# Patient Record
Sex: Female | Born: 2000 | Race: White | Hispanic: No | Marital: Single | Smoking: Never smoker
Health system: Southern US, Community
[De-identification: ages and names within clinical notes are randomized; demographics above are authoritative.]

## PROBLEM LIST (undated history)

## (undated) DIAGNOSIS — G7000 Myasthenia gravis without (acute) exacerbation: Secondary | ICD-10-CM

## (undated) DIAGNOSIS — Z91018 Allergy to other foods: Secondary | ICD-10-CM

## (undated) DIAGNOSIS — D509 Iron deficiency anemia, unspecified: Secondary | ICD-10-CM

## (undated) DIAGNOSIS — Z8669 Personal history of other diseases of the nervous system and sense organs: Secondary | ICD-10-CM

## (undated) DIAGNOSIS — R519 Headache, unspecified: Secondary | ICD-10-CM

## (undated) DIAGNOSIS — Z931 Gastrostomy status: Secondary | ICD-10-CM

## (undated) DIAGNOSIS — R011 Cardiac murmur, unspecified: Secondary | ICD-10-CM

## (undated) DIAGNOSIS — M791 Myalgia, unspecified site: Secondary | ICD-10-CM

## (undated) DIAGNOSIS — K589 Irritable bowel syndrome without diarrhea: Secondary | ICD-10-CM

## (undated) DIAGNOSIS — E039 Hypothyroidism, unspecified: Secondary | ICD-10-CM

## (undated) DIAGNOSIS — M2550 Pain in unspecified joint: Secondary | ICD-10-CM

## (undated) DIAGNOSIS — K219 Gastro-esophageal reflux disease without esophagitis: Secondary | ICD-10-CM

## (undated) DIAGNOSIS — G909 Disorder of the autonomic nervous system, unspecified: Secondary | ICD-10-CM

## (undated) DIAGNOSIS — G950 Syringomyelia and syringobulbia: Secondary | ICD-10-CM

## (undated) DIAGNOSIS — Z9889 Other specified postprocedural states: Secondary | ICD-10-CM

## (undated) DIAGNOSIS — E221 Hyperprolactinemia: Secondary | ICD-10-CM

## (undated) DIAGNOSIS — R0600 Dyspnea, unspecified: Secondary | ICD-10-CM

## (undated) DIAGNOSIS — L309 Dermatitis, unspecified: Secondary | ICD-10-CM

## (undated) DIAGNOSIS — K8020 Calculus of gallbladder without cholecystitis without obstruction: Secondary | ICD-10-CM

## (undated) DIAGNOSIS — J189 Pneumonia, unspecified organism: Secondary | ICD-10-CM

## (undated) DIAGNOSIS — Q796 Ehlers-Danlos syndrome, unspecified: Secondary | ICD-10-CM

## (undated) DIAGNOSIS — T8859XA Other complications of anesthesia, initial encounter: Secondary | ICD-10-CM

## (undated) DIAGNOSIS — I7300 Raynaud's syndrome without gangrene: Secondary | ICD-10-CM

## (undated) DIAGNOSIS — T4145XA Adverse effect of unspecified anesthetic, initial encounter: Secondary | ICD-10-CM

## (undated) DIAGNOSIS — H903 Sensorineural hearing loss, bilateral: Secondary | ICD-10-CM

## (undated) DIAGNOSIS — G935 Compression of brain: Secondary | ICD-10-CM

## (undated) DIAGNOSIS — Z95828 Presence of other vascular implants and grafts: Secondary | ICD-10-CM

## (undated) DIAGNOSIS — R112 Nausea with vomiting, unspecified: Secondary | ICD-10-CM

## (undated) DIAGNOSIS — E162 Hypoglycemia, unspecified: Secondary | ICD-10-CM

## (undated) DIAGNOSIS — E8840 Mitochondrial metabolism disorder, unspecified: Secondary | ICD-10-CM

## (undated) DIAGNOSIS — Q6689 Other  specified congenital deformities of feet: Secondary | ICD-10-CM

## (undated) DIAGNOSIS — D649 Anemia, unspecified: Secondary | ICD-10-CM

## (undated) HISTORY — DX: Dermatitis, unspecified: L30.9

## (undated) HISTORY — PX: GASTROSTOMY TUBE PLACEMENT: SHX655

## (undated) HISTORY — DX: Anemia, unspecified: D64.9

## (undated) HISTORY — DX: Hypothyroidism, unspecified: E03.9

## (undated) HISTORY — DX: Mitochondrial metabolism disorder, unspecified: E88.40

## (undated) HISTORY — PX: GALLBLADDER SURGERY: SHX652

## (undated) HISTORY — DX: Calculus of gallbladder without cholecystitis without obstruction: K80.20

## (undated) HISTORY — DX: Compression of brain: G93.5

## (undated) HISTORY — PX: OTHER SURGICAL HISTORY: SHX169

## (undated) HISTORY — PX: MUSCLE BIOPSY: SHX716

## (undated) HISTORY — DX: Hypoglycemia, unspecified: E16.2

## (undated) HISTORY — PX: TONSILLECTOMY: SUR1361

## (undated) HISTORY — PX: ADENOIDECTOMY: SUR15

---
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
## 2019-01-03 ENCOUNTER — Encounter (INDEPENDENT_AMBULATORY_CARE_PROVIDER_SITE_OTHER): Payer: Self-pay | Admitting: *Deleted

## 2019-01-28 ENCOUNTER — Telehealth (INDEPENDENT_AMBULATORY_CARE_PROVIDER_SITE_OTHER): Payer: Self-pay | Admitting: Family

## 2019-01-28 NOTE — Telephone Encounter (Signed)
  Who's calling (name and relationship to patient) : [facility]   Best contact number: [phone]  Provider they see: [facility]  Reason for call:  Call stated they needed clinical records for patient (fax [phone])    PRESCRIPTION REFILL ONLY  Name of prescription:  Pharmacy:

## 2019-01-28 NOTE — Telephone Encounter (Signed)
Last office visit faxed as requested to number provided.

## 2019-02-03 ENCOUNTER — Other Ambulatory Visit (INDEPENDENT_AMBULATORY_CARE_PROVIDER_SITE_OTHER): Payer: Self-pay | Admitting: Pediatrics

## 2019-02-08 ENCOUNTER — Encounter (INDEPENDENT_AMBULATORY_CARE_PROVIDER_SITE_OTHER): Payer: Self-pay

## 2019-02-10 ENCOUNTER — Encounter (INDEPENDENT_AMBULATORY_CARE_PROVIDER_SITE_OTHER): Payer: Self-pay | Admitting: "Endocrinology

## 2019-02-10 ENCOUNTER — Other Ambulatory Visit: Payer: Self-pay

## 2019-02-10 ENCOUNTER — Ambulatory Visit (INDEPENDENT_AMBULATORY_CARE_PROVIDER_SITE_OTHER): Payer: Commercial Managed Care - PPO | Admitting: "Endocrinology

## 2019-02-10 VITALS — BP 108/66 | HR 64 | Ht 64.17 in | Wt 121.6 lb

## 2019-02-10 DIAGNOSIS — R42 Dizziness and giddiness: Secondary | ICD-10-CM

## 2019-02-10 DIAGNOSIS — E860 Dehydration: Secondary | ICD-10-CM

## 2019-02-10 DIAGNOSIS — I951 Orthostatic hypotension: Secondary | ICD-10-CM

## 2019-02-10 DIAGNOSIS — E063 Autoimmune thyroiditis: Secondary | ICD-10-CM | POA: Diagnosis not present

## 2019-02-10 DIAGNOSIS — E039 Hypothyroidism, unspecified: Secondary | ICD-10-CM

## 2019-02-10 DIAGNOSIS — E221 Hyperprolactinemia: Secondary | ICD-10-CM

## 2019-02-10 DIAGNOSIS — E049 Nontoxic goiter, unspecified: Secondary | ICD-10-CM | POA: Diagnosis not present

## 2019-02-10 DIAGNOSIS — E162 Hypoglycemia, unspecified: Secondary | ICD-10-CM

## 2019-02-10 DIAGNOSIS — E2740 Unspecified adrenocortical insufficiency: Secondary | ICD-10-CM

## 2019-02-10 DIAGNOSIS — R5383 Other fatigue: Secondary | ICD-10-CM

## 2019-02-10 DIAGNOSIS — M6281 Muscle weakness (generalized): Secondary | ICD-10-CM

## 2019-02-10 NOTE — Progress Notes (Signed)
Subjective:  Subjective  Patient Name: Laura Parrish Date of Birth: 01-30-2000  MRN: [id number]  Laura Parrish  presents for her televisit today for follow up evaluation and management of her acquired hypothyroidism due to Hashimoto's thyroiditis and many other health problems.   HISTORY OF PRESENT ILLNESS:   Laura Parrish is a 19 y.o. Caucasian young lady.   Carlene was accompanied by her mother  1. Laura Parrish's initial pediatric endocrine clinic visit here at [facility] occurred on 01/29/18. The following information is a composite of the history gained at her initial visit, information gained after 7 hours of reviewing her records in [place], and information gleaned on 02/05/18:   A. Aniyia has had a very complicated medical and surgical history. She has been hospitalized many times during her life. Her problem list includes the following:    1). Chronic GI problems, with frequent constipation, frequent diarrhea, BMs occurring immediately after eating, nausea, abdominal pains, and feeding difficulties requiring the use of both oral and nocturnal G-tube feedings: Previous EGD and colonoscopy have been unremarkable. TTG IgA was negative on 12/29/17.    2). Chronic allergies, including seasonal allergies, allergies to other environmental agents, latex, Propofol, fish and shellfish according to rashes but not to testing, and Alpha-Gal allergy with a positive result, but not specifically beef, pork, or lamb;   3). Chronic fatigue   4). Iron deficiency anemia   5). Hypothyroidism with elevated TPO and thyroglobulin antibodies, c/w Hashimoto's thyroiditis;       6). Muscle weakness and myalgias   7). 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 Family was offered treatment with Florinef, but declined because their pharmacist advised against starting Florinef due to possible adverse effects.     8). Intermittent dehydration requiring administration  of iv fluids via her port-a-cath every 6 weeks, but more recently every 2 weeks   9). Chronic headaches, to include migraines             10). 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 Chronic swelling of her hands and feet   13). Chronic pain of her back, joints, legs, and feet   14). Skin rashes, hives, eczema, and flushing   15). Irregular menses and menorrhagia   16). Nocturnal hypoglycemia if she does not have nocturnal pump feedings.    17). Chronic illness with low grade fevers, sometimes up to 101, but mostly 99-100 degrees   18). 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 However, her testing did not support that hypothesis. Mom has the same genetic variant, but not all of the same problems.   B. Perinatal history: Gestational Age: [redacted]w[redacted]d; 5 lb 7 oz (2.466 kg); Healthy newborn, except noted to have a right club foot deformity.    C. Infancy: She had to be hospitalized several times for unexplained dehydration. She had club foot surgery at about 48 weeks of age.   D. Childhood:   1). She was sick a lot.    2). She had PT for her club foot.    3). A Chiari malformation was discovered at age 22. She had decompression surgery in 2007.    4).  In 2008 she was discovered at [facility] to have a tethered cord. She had a release procedure in 2008.   5). In 2010 she had a muscle BX that showed mild variation in fiber size, more notable in Type I fibers with rare atrophic Type I fibers. EM showed that the normal sarcomeric architecture was preserved. Mitochondria were morphologically unremarkable. Dr. Oliver Barre reportedly told the family that [place]man had a mitochondrial  cytopathy and mitochondrial myopathy.    6). In 2010 she continued to have severe problems with lack of appetite, nausea, and feeding difficulties. In late 2010 she had a G-tube placed.    7). Genetic testing was performed at [facility] on 09/02/08. Karyotype and chromosomal array were normal. 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 Mitochondrial DNA copy number in muscle tissue was normal, 123% of the mean value of the age and tissue tested controls.   8). On 10/18/12 Deshia's TFTs were c/w primary hypothyroidism. Levothyroxine treatment was initiated.    9). She had a cholecystectomy in June 2015 for biliary hypokinesia.    10). A port-a-cath was placed in August 2018.    11). In December 2018 she saw a geneticist, Dr. Delilah Shan, in [place] for evaluation of a mitochondrial dysfunction. 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 Targeted GeneDX testing was performed in June-July 2019 and reportedly showed allergies in both [place] and her brother. Testing in Teah's brother also showed genetic tendencies for seizures and developmental delays.     E. Chief complaint:   1). Hypothyroidism was diagnosed on 18 October 2012 after several years of variable thyroid hormone test results. Her TSH was high 10.35 (ref 0.5-4.5) and free T4 was low at 0.67 (ref 0.80-2.0). TPO antibody and thyroglobulin antibody were both reportedly positive. She started thyroid hormone at about that time. She takes generic levothyroxine, 50 mcg/day. That dose has not been changed since it was started. She does not think that she has ever had thyroid swelling or pain. Mom said that Chrishana's TSH and free T4 values have been normal. However, her TSH was elevated again in February 2019.   2). Hypoglycemia: At about age 26 she had low BGs almost all the time. If she did not get G-tube feedings at night, the BGs in the mornings could be in  the 40s-50s. She had never been evaluated for adrenal insufficiency. If her BGs are low during the day she can get dizzy.   3). Dizziness: She has been dizzy for many years. The dizziness was originally ascribed to her Arnold-Chiari malformation, but did not improve after surgery. About 30% of her dizziness was associated with low BGs. Some of her dizziness was a spinning dizziness.  Some of her dizziness appears to be orthostatic dizziness. This latter dizziness occurs almost every morning when she sits up from a supine or prone position. The dizziness can also occur if she stands up too fast from a seated position. Whenever she gets dizzy for any reason, she often gets black spots in front of her eyes and feels like she will pass out.   4). Abnormal periods and excessive menstrual and intermenstrual bleeding: Menarche occurred at age 39. Periods have never been regular. When she took most OCPs she had persistent vaginal bleeding. Seasonale caused her to have menses every 3 months for about one year, then it became less effective. She continued to have heavy spotting. This problem has been sometimes attributed to a connective tissue  disorder.    5). 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 Her initial [facility] consultation occurred on 03/15/12.      1). The report of her 2009 muscle BX was reviewed. The genetics reports from [facility] done in July 2010 and from [facility] in 2010 were reviewed. Also reviewed was a report from Dr. Salomon Fick of the [facility] who did not endorse support for a diagnosis of mitochondrial disease.      2). The [facility] geneticist noted that none of Denetria's previous testing supported a diagnosis of a mitochondrial disorder. The geneticist also stated that no specific genetic disorders are known to cause her constellation of symptoms shared by her and her younger brother, including Chiari I malformation. Genome sequencing testing, to determine  if Julianah and her brother had a unique gene mutation underlying their presentations, was offered to the family. Unfortunately the costs of that testing were too high for the family to agree to the testing     B). Her [facility] record note from 10/15/15 shows that Laura Parrish's previous tests for myopathy and mitochondrial function were all negative. Genetic whole exome testing was negative for a 19 y.o. female with Chiari I malformation, fatigue, muscle weakness, feeding difficulties, tethered cord, and clinical diagnosis of Ehlers-Danlos syndrome. GeneDX testing showed a variant in the mitochondrial genome of uncertain significance that was found in 200% of Arah's mitochondria and 100% of her mother's mitochondria. Brother presumably had the same variant. It was noted that since [place] and Braxton inherited the same change as the mother and at the same levels, it was unlikely that this variant was causing Lou's and Braxton's symptoms, because the mother did not exhibit similar symptoms. [Addendum 02/05/18: Mom says that she did have some of the same symptoms that her children had, but the geneticist might not have realized that mom did have some of the same symptoms. Mom did not have as many symptoms as Laura Parrish and Braxton had.] Multiple rounds of genetic testing including WES and mitochondrial genome sequencing, were not suggestive of mitochondrial disorder or any other genetic diseases. The family was offered the option of applying for further testing in the [facility]. The family applied for that program, but were not accepted due to the fact that the program wanted patients with different issues.     C). When Laura Parrish saw Dr Penni Bombard, a geneticist in [place], he confirmed that she does have a mitochondrial disorder and also has Ehlers-Danlos syndrome.     6). Feeling unwell, fatigue, fevers, headaches, nasal and head congestion, lack of appetite: These problems vary from one part of the day to  another. She has at least one of them every day.     A). Fatigue: She does not feel tired when she awakens, but does begin to feel tired about 2 PM. If she naps for an hour or two, the fatigue improves for several hours, but then recurs around 6 PM. If she does not nap, she remains tired and develops a bitemporal headache about 4-5 PM. She occasionally has a problem with insomnia, but not with early awakening. Sleeping in on weekends does not improve her fatigue.      B). Fevers: When she feels bad she usually has fevers. Most of her fevers are in the 98.8-99.2 range. These "fevers" occur about 5-7 PM.     C). Nasal and head congestion: She has had a stuffed up nose for 4-5 months. When she  has nasal congestion she also sometimes has head congestion. However, she does not have head congestion without having nasal congestion.      D). Headaches: She often has nuchal headaches when her trapezius muscles are sore/stiff. Sometimes these nuchal headache extend to the vertex. She also has bitemporal headaches when she gets tired.   F. Pertinent family history:   1). Stature and puberty: Mom is 5-1. Dad is 5-5. Mom had menarche at age 20. Mom has had irregular menses prior to her first pregnancy. She was diagnosed with endometriosis after her first pregnancy. After treatment for endometriosis, presumably GnRH analogues for 6 months, her menses were normal.     2). Obesity: Maternal grandmother   3). DM: Maternal grandmother has T2DM. A maternal grand uncle had DM, possibly type 1.    4). Thyroid: 20 y.o. sister has had a few abnormal thyroid hormone levels. Paternal grandmother is hypothyroid, without having had thyroid surgery, irradiation. or been on a prolonged low iodine diet.    5). ASCVD: Mother has a heart murmur.    6). Cancers: paternal grandfather dies of lung CA. There was also leukemia in a distant paternal relative.    7). Others: Older sister has had oligomenorrhea frequently. Mother has  migraines. Younger brother has worse mitochondrial dysfunction and seizures. Maternal grandmother has osteoarthritis and fibromyalgia.  Maternal grandmother takes B12 shots for anemia. Maternal grandmother also has hypertension.  One maternal uncle had cardiomyopathy. Maternal grand uncle had his first CVA at age 20. A distant paternal second cousin and a paternal first cousin had lupus.]    G. Lifestyle:   1). Jasani's diet: She can eat food normally and typically eats 3 small meals per day. She estimates that about 40% of her calories comes from oral intake. She does not eat much at any one time. She is not a "foodie" by nature and is a very picky eater.  She has G-tube feedings every night: 2 cans of Vital Peptide, 1.5 calories per mL, continuous feeding, from 9:30 PM to 7:30 AM via a pump. She does not usually take fluids by the G-tube during the day, but may do so during the Summer months to prevent dehydration.    2). She receives iv fluid infusions by her port-a-cath every other week: D5 1/2 NS, three 1000 ml bags over 12 hours. If she gets sick or gets dehydrated, she gets 3000 ml of D10NS iv over 12 hour. She receives substantial home health nursing support from [facility].     3). Physical activities: She does not walk much for exercise because walking causes her feet to swell. She does not go outside much during the Winter months. She was told by one doctor that she had angioedema.    4). Medications:    A). Morning medications: CoQ10, vitamin E, vitamin C. Prevacid, and allergy medications. Breakfast is usually a fruit smoothy.     B). Bedtime: LT4, Topamax, Lyrica, Seasonale,  2. Clinical course:  A. In May 2020 she had a central venous catheter access device inserted. The catheter is working well.   3. Laura [facility]rish[facility] visit occurred on 11/18/18. I continued her on Synthroid 88 mcg/day.   A. In the interim she has been fairly healthy, but has had  some intermittent URI symptoms. She still has had temperatures in the 99.4-100.0 in the evenings, but normalizes the next day.    B. Her breathing difficulties have worsened. Dr. Damita LackStoudemire feels that she  has muscle weakness. [place] saw Dr. Damita Lack on 11/19/18. Her PFTs were essentially normal. The MIP/MEPs were better, but still low.  Her sleep study was normal. He put her on Mestinon three times daily. The Mestinon helped for several hours.   C. She also saw Dr. Artis Flock on 11/19/18. Dr. Artis Flock ordered an EMG to be done at [facility]  .   D. Laura Parrish is still receiving feedings orally and also via her G-tube. She still takes 88 mcg/day of Synthroid.   E. She is having alternating sensations of being hot and cold.   F. She is still pretty tired.   G. She now has weekly 12-hour iv infusions of D5. She is having fewer headaches. She feels better for about a week after the infusions.   4. Pertinent Review of Systems:  Constitutional: Laura Parrish feels "tired". She is colder than others most of the time. Conversely when she is out in the heat during the Summer, she never sweats.  Headaches are somewhat worse. She also has frequent bilateral ear aches and popping of both ears. 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 The possibility of otosclerosis has been brought up for Laura Parrish since mom has that problem.   Eyes: Vision is "not great, but not horrible, but slightly worse". Her glasses help a little bit. She has not had an exam in the last year. There are no other significant eye complaints. Neck: The patient has had occasional complaints of anterior neck soreness and difficulty swallowing.  Heart: Her heart rate is usually in the 60-70s, but when she is tired the heart rate can be in the 50s-60s. She occasionally has chest pain in the shoulders and in the middle of her chest. The chest will often be tender to palpation at those times. The patient has no complaints of palpitations,  irregular heat beats, chest pain, or chest pressure. Gastrointestinal: She has frequent nausea in the middle of the night. The nausea can last for up to three hours if she does not take Zofran. She sometimes also has nausea during the day, but she has not been able to identify a particular trigger. She also occasionally has some mild reflux. She infrequently has hypogastric abdominal pains, usually associated with being constipated or having a menstrual period. Bowel movents alternate from diarrhea to constipation. The patient has no complaints of excessive hunger. Hands: Always wet and cold.  Legs: Muscle mass and strength "are not great". She occasionally has pains of her anterior thighs and anterior shins. There are no other complaints of numbness, tingling, burning, or pain. She notes edema about 3-4 days per week.  Feet: Feet are always wet and cold. There are no obvious foot problems. She occasionally has numbing of the medial 3-4 toes. There are no other complaints of numbness, tingling, burning, or pain. She often has dorsal edema.  GYN: LMP symptoms occurred in October 2020, but see did not have bleeding. Her Seasonale contraceptive cause her to have menses about every three months.   PAST MEDICAL, FAMILY, AND SOCIAL HISTORY  Past Medical History:  Diagnosis Date  . Chiari I malformation (HCC)   . Eczema   . Hypoglycemia   . Hypothyroid   . Mitochondrial disease (HCC)     Family History  Problem Relation Age of Onset  . Asthma Mother   . Allergic rhinitis Mother   . Heart murmur Mother   . Allergic rhinitis Sister   . Allergic rhinitis Brother   .  Asthma Brother   . Seizures Brother   . Mitochondrial disorder Brother   . Allergic rhinitis Maternal Grandmother   . Asthma Maternal Grandmother   . Diabetes type II Maternal Grandmother   . Lung cancer Maternal Grandfather   . Angioedema Neg Hx   . Eczema Neg Hx   . Immunodeficiency Neg Hx   . Urticaria Neg Hx      Current  Outpatient Medications:  .  albuterol (PROVENTIL HFA) 108 (90 Base) MCG/ACT inhaler, Inhale 2 puffs into the lungs every 4 (four) hours as needed for wheezing or shortness of breath., Disp: 6.7 g, Rfl: 2 .  ascorbic acid (VITAMIN C) 250 MG tablet, , Disp: , Rfl:  .  azelastine (ASTELIN) 0.1 % nasal spray, Place 2 sprays into both nostrils 2 (two) times daily. Use in each nostril as directed, Disp: 30 mL, Rfl: 5 .  clobetasol (OLUX) 0.05 % topical foam, Apply topically 2 (two) times daily. Apply to affected areas twice daily as needed taking care to avoid axillae and groin area., Disp: 45 g, Rfl: 3 .  Coenzyme Q-10 200 MG CAPS, Take 1 capsule by mouth daily. , Disp: , Rfl:  .  cromolyn (GASTROCROM) 100 MG/5ML solution, Take 5 mLs (100 mg total) by mouth 4 (four) times daily -  before meals and at bedtime., Disp: 600 mL, Rfl: 12 .  EPINEPHrine 0.3 mg/0.3 mL IJ SOAJ injection, Inject into the muscle once., Disp: , Rfl:  .  lansoprazole (PREVACID) 30 MG capsule, TAKE 1 CAPSULE BY MOUTH DAILY, Disp: 90 capsule, Rfl: 0 .  leucovorin (WELLCOVORIN) 25 MG tablet, Take 1 tablet by mouth 2 (two) times daily., Disp: , Rfl:  .  levOCARNitine (CARNITOR) 1 GM/10ML solution, TAKE 2 TEASPOONFUL (10ML) BY MOUTH TWICEDAILY, Disp: 118 mL, Rfl: 12 .  levocetirizine (XYZAL) 5 MG tablet, TAKE 1 TABLET BY MOUTH EVERY EVENING, Disp: 30 tablet, Rfl: 1 .  levonorgestrel-ethinyl estradiol (SEASONALE) 0.15-0.03 MG tablet, TAKE 1 TABLET BY MOUTH EVERY DAY, Disp: 1 Package, Rfl: 2 .  lidocaine (XYLOCAINE) 5 % ointment, Apply 1 application topically as needed., Disp: 35.44 g, Rfl: 3 .  metaxalone (SKELAXIN) 400 MG tablet, Take 1 tablet (400 mg total) by mouth daily., Disp: 30 tablet, Rfl: 6 .  Nutritional Supplements (ENSURE CLEAR) LIQD, 474 mLs by Gastrostomy Tube route daily., Disp: 60 Bottle, Rfl: 5 .  Nutritional Supplements (PEDIASURE PEPTIDE 1.0 CAL PO), Take 1 tablet by mouth daily. , Disp: , Rfl:  .  ondansetron  (ZOFRAN-ODT) 4 MG disintegrating tablet, Take 1 tablet under the tongue every 6-8 hours as needed for nausea., Disp: 30 tablet, Rfl: 5 .  polyethylene glycol powder (GLYCOLAX/MIRALAX) powder, 0.5 Containers as needed. , Disp: , Rfl:  .  pregabalin (LYRICA) 75 MG capsule, TAKE 1 CAPSULE BY MOUTH IN THE MORNING AND 2 CAPSULES IN THE EVENING FOR LEG PAIN AND HEADACHE, Disp: 90 capsule, Rfl: 5 .  promethazine (PHENERGAN) 25 MG tablet, Take 1 tablet at onset of migraine. May repeat every 6 hours PRN, Disp: 30 tablet, Rfl: 0 .  pyridostigmine (MESTINON) 60 MG tablet, Take 1 tablet (60 mg total) by mouth 3 (three) times daily., Disp: 30 tablet, Rfl: 3 .  rizatriptan (MAXALT) 10 MG tablet, TAKE 1 TABLET BY MOUTH AS NEEDED AS DIRECTED, Disp: 12 tablet, Rfl: 3 .  SYNTHROID 88 MCG tablet, TAKE 1 TABLET BY MOUTH EVERY MORNING ON AN EMPTY STOMACH, Disp: 90 tablet, Rfl: 1 .  TROKENDI XR 50 MG CP24,  TAKE 1 CAPSULE BY MOUTH DAILY, Disp: 30 capsule, Rfl: 3  Allergies as of 02/10/2019 - Review Complete 02/10/2019  Allergen Reaction Noted  . Other Anaphylaxis 03/29/2013  . Latex Rash 04/08/2012  . Lactated ringers  05/28/2018  . Propofol Other (See Comments) 05/17/2012  . Tape Itching and Other (See Comments) 05/17/2012     reports that she has never smoked. She has never used smokeless tobacco. She reports that she does not drink alcohol or use drugs. Pediatric History  Patient Parents  . Yohannes,Leesa (Mother)  . Donovan Kail (Father)   Other Topics Concern  . Not on file  Social History Narrative   Deniesha is at [employer] for associate in arts to transfer to [employer].    She enjoys reading, playing with her cat and her dog, and hanging out with her sister.     1. School and Family: She attends [employer], with mostly on-line classes 2. Activities: Sedentary 3. Primary Care Provider: Janne Napoleon, MD  4. Peds Neurology: Dr. Carylon Perches, MD 5.  Allergy: Dr. Salvatore Marvel, All[facility] and [place], [place] 6. GI: No one at present 7. [facility]eds [facility]ology: [facility] 8. Peds Pulmonary: Dr. Glencoe Cellar 9. Genetics: [facility] in the past  REVIEW OF SYSTEMS: There are no other significant problems involving Analycia's other body systems.    Objective:  Objective  Vital Signs:  BP 108/66   Pulse 64   Ht 5' 4.17" (1.63 m)   Wt 121 lb 9.6 oz (55.2 kg)   BMI 20.76 kg/m    Ht Readings from Last 3 Encounters:  02/10/19 5' 4.17" (1.63 m) (48 %, Z= -0.05)*  10/15/18 5' 4.25" (1.632 m) (49 %, Z= -0.01)*  08/25/18 5' 4.17" (1.63 m) (48 %, Z= -0.04)*   * Growth percentiles are based on CDC (Girls, 2-20 Years) data.   Wt Readings from Last 3 Encounters:  02/10/19 121 lb 9.6 oz (55.2 kg) (37 %, Z= -0.32)*  11/19/18 120 lb 9.5 oz (54.7 kg) (36 %, Z= -0.35)*  11/19/18 120 lb 9.5 oz (54.7 kg) (36 %, Z= -0.35)*   * Growth percentiles are based on CDC (Girls, 2-20 Years) data.   HC Readings from Last 3 Encounters:  No data found for [facility]   Body surface area is 1.58 meters squared. 48 %ile (Z= -0.05) based on CDC (Girls, 2-20 Years) Stature-for-age data based on Stature recorded on 02/10/2019. 37 %ile (Z= -0.32) based on CDC (Girls, 2-20 Years) weight-for-age data using vitals from 02/10/2019.     PHYSICAL EXAM:  Constitutional: Emerly looks tired. She is awake and alert. Her affect is rather flat. Her insight is good.  Eyes: There is no arcus or proptosis.  Mouth: The oropharynx appears normal. The tongue appears normal. There is normal oral moisture. There is no obvious gingivitis. Thre is no oral hyperpigmentation.  Neck: There are no bruits present. The thyroid gland appears mildly enlarged.  The thyroid gland is approximately 21-22. grams in size. Both lobes are enlarged, with the left lobe being larger. The consistency of the thyroid gland is somewhat full. She has bilateral thyroid tenderness to palpation, more intense on the  right.  Lungs: The lungs are clear. Air movement is good. Heart: The heart rhythm and rate appear normal. Heart sounds S1 and S2 are normal. I do not appreciate any pathologic heart murmurs. Abdomen: The abdominal size is normal. Bowel sounds are normal. The abdomen is soft and non-tender. There is  no obviously palpable hepatomegaly, splenomegaly, or other masses.  Arms: Muscle mass appears appropriate for age.  Hands: There is no obvious tremor. Phalangeal and metacarpophalangeal joints appear normal. Palms are normal. Legs: Muscle mass appears appropriate for age. There is no edema.  Neurologic: Muscle strength is 4/5 in the UEs and 4-5/5 in th LEs. Muscle tone appears normal. Sensation to touch is normal in the legs.    LAB DATA:   Labs 11/12/18: TSH 1.12, free T4 1.38, T3 156 (ref 71-180); prolactin 11.3 (ref 4.8-23.3)  Labs 09/16/18: Androstenedione 55 (ref 51-230), DHEAS 67 (ref 51-321)  Labs 08/30/18 at 8:43 AM: ACTH 13 (ref 6-50), cortisol 26.2 (ref 4-22), aldosterone 4 (ref < 28), plasma renin activity 2.4 (ref 0.25-5.82),   Labs 08/02/18: TSH 2.12, free T4 1.3, free T3 3.0; CMP normal; aldosterone <1 (reg 3-28), plasma renin activity (PRA) 0.92 (ref 0.25-5.82); prolactin 9.9 (ref 3-30)  Labs 02/01/18 at 8 AM: TSH 4.16 (ref 0.50-4.30, but many endocrinologists consider 3.4 to be the true physiologic upper limit of normal), free T4 1.4 (ref 0.8-1.4), free T3 2.7 (ref 3.0-4.7), TPO antibody 241 (ref <9), thyroglobulin antibody 1 (ref < or = 1); CMP normal except CO2 19; cholesterol 123, triglycerides 114, HDL 37, LDL 66; Prolactin 16.1 (ref 3.3-20); IGFBP-3 7.8 (ref 3.2-7.9); IGF-1 249 (ref 108-548, but very appropriate for a young woman whose height has plateaued); iron 177 (ref 27-164); CBC normal; androstenedione 46 (ref 51-230), DHEAS 74 (ref 51-321); ACTH 14, cortisol 28.9  Labs 11/25/17: TSH 1.58; CBC normal except 736 eosinophils (ref 15-500)  Labs 06/24/17: TSH 2.418, free T4  0.98  Labs 04/21/17: TSH 1.30  Labs 03/10/17: TSH 7.87, free T4 1.2; CBC normal; prolactin 7.1  Labs 06/24/16: TSH 1.960, free T4 1.12  Labs 04/10/16: TSH 2.28, TPO antibody 165 (ref <9), thyroglobulin antibody 2 (ref <2)  Labs 05/29/15: TSH 1.640, free T4 1.09  Labs 11/28/14: TSH 1.87, free T4 0.80  Labs 05/31/2014: TSH 1.680, free T4 1.23  Labs 04/19/14: TSH 1.19  Labs 09/30/13: TSH 2.53, free T4 1.14  Labs 02/04/13: TSH 0.88, T4 11.3  Labs 10/18/12: TSH 10.35 (ref 0.5-4.5), free T4 0.67 (ref 0.8-2.0)    Assessment and Plan:  Assessment  ASSESSMENT:  1-3. Hypothyroid, acquired/goiter/thyroiditis:   A. The occurrence of autoimmune thyroid disease with positive anti-thyroid antibodies is very common. What is uncommon is that her dose of levothyroxine had not changed in about 6 years.   B. At her visit in January 2020 her TSH was >4.0 and her TPO antibody level was higher. We increased her Synthroid dose to 88 mcg/day.   C. From January to August 2020 her goiter remained the same in overall size, but the lobes had shifted in size. The process of waxing and waning of thyroid gland size was c/w evolving Hashimoto's thyroiditis.   D. At her August visit her TFTs were at about the 30% of the physiologic range. I would usually have  increase the Synthroid dose to 100 mcg/day, but decided to await the results of her Hypothalamic-Pituitary-Adrenal (HPA) axis testing. Fortunately, those results were normal.   E. Since her August 2020 visit she has had intermittent flare ups of thyroiditis.   E. Her TFTs drawn on 11/12/18 were mid-euthyroid on her current Synthroid dose of 88 mcg/day that she takes in the mornings. She looks mildly hypothyroid now. We will continue this 88 mcg dose for now, but repeat her TFTS.  4. Dizziness/hypotension:   A. She still occasionally  has elements of spinning dizziness c/w vertigo, orthostatic dizziness c/w POTS /dysautonomia and intermittent dehydration. Fortunately,  these symptoms have been less frequent and less severe.   B. Chewing gum and taking meclizine can help the vertigo.  C. Remaining well hydrated can help with the orthostasis.    D. Since both her aldosterone and renin were "lowish" in October. It is reasonable to conduct a therapeutic trial of fludrocortisone. 0.1 mg/day.  5-7. Weakness/fatigue/cold sensation:    A. The cause(s) of these problems is/are unclear. 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   B. Her ACTH and adrenal hormone tests were normal in January 2020 and again in August and September 2020. Her aldosterone was very low in July 2020 but normal in August.   C. She was euthyroid in July 2020, at about the 30% of the physiologic range. She might benefit from a small increase in Synthroid dose once we re-check her TFTs.   D. She could have fibromyalgia, chronic fatigue, or some other "autoimmune" problem. 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   E. Giving the infusions of D5W every week has made Laura Parrish feel better.   F. Hot showers cause her to feel weak and dizzy. This problem is c/w orthostatic hypotension and with dysautonomia. 8. Hypoglycemia:   A. The cause(s) of this problem is/are also unclear. Her hypothalamic-pituitary-hepatic axis for Laura Parrish and her hypothalamic-pituitary-adrenal axis seemed to be normal previously. She could have celiac disease, but her tests in December 2019 were negative. She could have a problem with glycogen storage, glycogenolysis, and/or gluconeogenesis. The fact that she often does not eat much, and the fact that she appears to have a very active gastro-colic reflex may interact to impair glucose intake, glucose absorption, and glycogen synthesis.  B. She has not had any hypoglycemic symptoms recently.  9. Abnormal menses: The cause(s) of this problem is/are also unclear. Mother gives a good history for her own endometriosis that resolved after treatment with  GnRH agonists.  10. Alternating constipation and diarrhea: She has a very active gastro-colic reflex. She is no longer followed by Peds GI. Mother is trying to find a local adult GI specialist who will accept Laura Parrish.  11. Hyperprolactinemia:   A. It is highly likely that her elevated prolactin In January 2020 was due to her hypothyroidism.   B. Her prolactin level in July 2020 was well within normal, paralleling her improvement in thyroid hormone status. Her prolactin level in October 2020 was also quite normal.     12. Hyperhidrosis, palms and soles: She may have dysautonomia.  13. Dehydration: She is consciously drinking 32 ounces of fluid per day. This problem could be due to an inadequate renin-angiotensin-aldosterone system, or to chronic diarrhea, or to other causes.   14. Bradycardia:   A. She is not on a beta-blocker or combined alpha-beta blocker. The fact that mom also has bradycardia suggests a dysautonomia. Hypothyroidism may also have been a causal factor in the past.  B. Recently, most of her HRs have been in the 60s-70s. 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 The aldosterone level and the renin level in August were normal. Her renal function and her adrenal functions were normal in August and September. As noted above,we will conduct a therapeutic trial of fludrocortisone.  16. Breathing difficulties:  Dr. Damita Lack is following this problem.   PLAN:  1. Diagnostic: I reviewed lab results. I ordered TFTs  and HbA1c today.  2. Therapeutic: Continue Synthroid 88 mcg/day for now.  Start fludrocortisone, 0.1 mg/day.  3. Patient education: We discussed all of the above at very great length. We discussed the 10 [place] method to maintain BP when she arises from a supine or sitting position.   4. Follow-up: 3 months.     David Stall, MD, CDE Pediatric and Adult Endocrinology

## 2019-02-11 LAB — COMPREHENSIVE METABOLIC PANEL
AG Ratio: 1.7 (calc) (ref 1.0–2.5)
ALT: 9 U/L (ref 5–32)
AST: 12 U/L (ref 12–32)
Albumin: 4.5 g/dL (ref 3.6–5.1)
Alkaline phosphatase (APISO): 74 U/L (ref 36–128)
BUN: 8 mg/dL (ref 7–20)
CO2: 22 mmol/L (ref 20–32)
Calcium: 9.5 mg/dL (ref 8.9–10.4)
Chloride: 108 mmol/L (ref 98–110)
Creat: 0.85 mg/dL (ref 0.50–1.00)
Globulin: 2.7 g/dL (calc) (ref 2.0–3.8)
Glucose, Bld: 83 mg/dL (ref 65–99)
Potassium: 3.8 mmol/L (ref 3.8–5.1)
Sodium: 140 mmol/L (ref 135–146)
Total Bilirubin: 0.5 mg/dL (ref 0.2–1.1)
Total Protein: 7.2 g/dL (ref 6.3–8.2)

## 2019-02-11 LAB — TSH: TSH: 0.73 mIU/L

## 2019-02-11 LAB — HEMOGLOBIN A1C
Hgb A1c MFr Bld: 5 % of total Hgb (ref <5.7)
Mean Plasma Glucose: 97 (calc)
eAG (mmol/L): 5.4 (calc)

## 2019-02-11 LAB — T3, FREE: T3, Free: 3 pg/mL (ref 3.0–4.7)

## 2019-02-11 LAB — T4, FREE: Free T4: 1.3 ng/dL (ref 0.8–1.4)

## 2019-02-11 MED ORDER — FLUDROCORTISONE ACETATE 0.1 MG PO TABS: ORAL_TABLET | ORAL | 11 refills | Status: DC

## 2019-02-11 NOTE — Patient Instructions (Signed)
Follow up visit in 3 months. 

## 2019-02-28 ENCOUNTER — Other Ambulatory Visit: Payer: Self-pay | Admitting: Family

## 2019-03-03 NOTE — Progress Notes (Signed)
   Medical Nutrition Therapy - Progress Note (Televisit) Appt start time: 11:30 AM Appt end time: 11:45 AM Reason for referral: G-tube Dependence  Referring provider: Dr. Artis Flock - [facility] DME: [employer] [facility] Pertinent medical hx: Chiari I malformation, syringomyelia, mitochondrial disease, iron-deficiency anemia, hypoglycemia, chronic diarrhea, chronic dehydration, +G-tube, +port (receives weekly infusions),  Assessment: Food allergies: red meat (can consume poultry), lactose intolerance Pertinent Medications: see medication list Vitamins/Supplements: MVI + iron, Vitamin B2, Co-Q10, Calcium, Plexus probiotic Pertinent Labs: (1/28) Hgb A1c: 5 WNL (1/28) Thyroid panel WNL (1/20) HDL: 37 LOW (1/20) Triglyceride: 114 HIGH  No anthros obtained today due to televisit.  (1/28) Anthropometrics: The child was weighed, measured, and plotted on the CDC growth chart. Ht: 163 cm (48 %)  Z-score: -0.05 Wt: 55.2 kg (37 %)  Z-score: -0.32 BMI: 20.7 (38 %)  Z-score: -0.30  (11/6) Anthropometrics: The child was weighed, measured, and plotted on the CDC growth chart. Ht: 163.2 cm (49 %)  Z-score: -0.01 Wt: 54.7 kg (36 %)  Z-score: -0.35 BMI: 20.5 (36 %)  Z-score: -0.35   (7/30) Wt: 54.1 kg (2/27) Wt: 50.1 kg (9/30) Wt: 52.6 kg (6/13): Wt: 52.2 kg   Estimated minimum caloric needs: 40 kcal/kg/day (EER) Estimated minimum protein needs: 0.85 g/kg/day (DRI) Estimated minimum fluid needs: 39 mL/kg/day (Holliday Segar)  Primary concerns today: Follow-up for Gtube dependence. Televisit due to COVID-19, joint with Dr. Artis Flock. Mom on screen with pt, consenting to appt.  Dietary Intake Hx: Usual feeding regimen: 3 cans Vital Peptide 1.5 at 55 mL/hr for 10 hours (8-9:30 PM - 7:30-10 AM) --- typically getting a full feed 5 nights per week 24-Hr Recall: Breakfast: coffee and pastry Lunch: sandwich with sun chips OR PB&J OR chicken salad Dinner: spaghetti Snacks: limited Beverages: ~36 oz water (using a  smart water bottle which helps), juice, tea, whole milk @ breakfast, 1-2 Ensure Clear PO most days, trying Plexus microbio drink mix for kids (hasnt noticed much of a difference)  GI: continued diarrhea up to 8x/day mostly in afternoon/evening  Physical Activity: normal ADL  2.14 Vital Peptide + 1.5 Ensure Clear + 36 oz water daily: Estimated caloric intake: 20 kcal/kg/day - meets 50% of estimated needs Estimated protein intake: 0.83 g/kg/day - meets 98% of estimated needs Estimated fluid intake: 32 mL/kg/day - meets 82% of estimated needs Pt likely meeting needs given weight maintenance  Nutrition Diagnosis: (7/30) Inadequate oral intake related to suspected mitochondrial disorder causing decreased appetite and poor PO feeding as evidence by G-tube dependent to meet caloric, protein, and fluid needs.  Intervention: Discussed current regimen. Pt has an appt with a new GI at [facility] on Monday. All questions answered, family in agreement with plan. Recommendations: - Continue current feeding regimen. - Please call me if you have any questions or concerns regarding feeds or your formulas.   Teach back method used.  Monitoring/Evaluation: Goals to Monitor: - Weight trends - TF tolerance  Follow-up in 3-6 months as needed.  Total time spent in counseling: 15 minutes.

## 2019-03-04 ENCOUNTER — Telehealth (INDEPENDENT_AMBULATORY_CARE_PROVIDER_SITE_OTHER): Payer: Commercial Managed Care - PPO | Admitting: Pediatrics

## 2019-03-04 ENCOUNTER — Encounter (INDEPENDENT_AMBULATORY_CARE_PROVIDER_SITE_OTHER): Payer: Self-pay | Admitting: Pediatrics

## 2019-03-04 ENCOUNTER — Ambulatory Visit (INDEPENDENT_AMBULATORY_CARE_PROVIDER_SITE_OTHER): Payer: Commercial Managed Care - PPO | Admitting: Dietician

## 2019-03-04 VITALS — Wt 118.0 lb

## 2019-03-04 DIAGNOSIS — Z931 Gastrostomy status: Secondary | ICD-10-CM | POA: Diagnosis not present

## 2019-03-04 DIAGNOSIS — Q6689 Other  specified congenital deformities of feet: Secondary | ICD-10-CM

## 2019-03-04 DIAGNOSIS — E8840 Mitochondrial metabolism disorder, unspecified: Secondary | ICD-10-CM | POA: Diagnosis not present

## 2019-03-04 DIAGNOSIS — R638 Other symptoms and signs concerning food and fluid intake: Secondary | ICD-10-CM

## 2019-03-04 DIAGNOSIS — Z95828 Presence of other vascular implants and grafts: Secondary | ICD-10-CM | POA: Diagnosis not present

## 2019-03-04 DIAGNOSIS — R531 Weakness: Secondary | ICD-10-CM

## 2019-03-04 DIAGNOSIS — G5600 Carpal tunnel syndrome, unspecified upper limb: Secondary | ICD-10-CM

## 2019-03-04 NOTE — Progress Notes (Signed)
Patient: Laura Parrish MRN: [id number] Sex: female DOB: [birth year]  Provider: Carylon Perches, MD  This is a Pediatric Specialist E-Visit follow up consult provided via WebEx.  Laura Parrish consented to an E-Visit consult today.  Location of patient: Laura Parrish is at home Location of provider: Marden Noble is at office Patient was referred by Janne Napoleon, MD   The following participants were involved in this E-Visit: Laura Parrish, CMA      Carylon Perches, MD  Chief Complain/ Reason for E-Visit today: Complex Care Follow-Up  History of Present Illness:  Laura Parrish is a 19 y.o. female with history of Chairi malformation s/p repair, tethered cord s/p repair, feeding intolerance s/p gastrostomy tube, lactose intolerance and alpha gal allergy, multiple hormonal irregularities including hypothyroidism, chronic headaches, chronic GI complaints, neuropathic pain in her feet, and symptoms concerning for mitochondrial diease now on IV fluids, although genetic testing and muscle biopsy unrevealing. Patient was last seen on 11/19/18 for shortness of breath, possibly due to muscle weakness.  I recommended trial of pyridostigmine  As referred for EMG given multiple symptoms that were similar to MG.  Also discussed follow-up with multiple other providers.    Patient presents today by herself.  She reports mestinon helped some with breathing, muscle aches, also helps with ptosis in the eyes.  She reports feeling eyes 'swaying back and forth" and twitching with it as well, mestinon has helped a little. She can't take it at night because it makes her stay up. She doesn't have any other muscle weakness prior, only poor endurance. This has been unchanged.  No side effects during the day.  Left shoulder still bothering her, hasn't changed. Still dislocating, hurting with movement. She has not seen the orthopedist.  Foot swelling unchanged.  Headaches, dizziness, muscle aches are at her baseline.  Feeds and PO intake unchanged, weekly infusions going well but does not feel she could space them out any further.   A new symptoms today, she reports her wrist hurts.  Described as numbness and tingling that goes into the hand. Pain and numbness worse when waking up, also occurs when writing.  She has noticed she overflexes when she sleeps. 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    Past Medical History Past Medical History:  Diagnosis Date  . Allergy to alpha-gal    elevated IgE 04/10/16  . Autonomic dysfunction   . Chiari I malformation ([place])   . Complication of anesthesia    slow to wake up  . Dyspnea   . Eczema   . GERD (gastroesophageal reflux disease)   . Headache   . Heart murmur   . Hypoglycemia   . Hypothyroid   . IBS (irritable bowel syndrome)   . Joint pain   . Mitochondrial disease ([place])   . Muscle pain   . Pneumonia    several times  . PONV (postoperative nausea and vomiting)     Surgical History Past Surgical History:  Procedure Laterality Date  . ADENOIDECTOMY     2004or 2005  . chiari decompression  2007  . GALLBLADDER SURGERY  2015  . GASTROSTOMY TUBE PLACEMENT     x 2  . MUSCLE BIOPSY  2010  . Port Replacement  05/2018   x 2 orignal 08/2016 then replacement 05/2018  . PORT-A-CATH REMOVAL N/A 03/24/2019   Procedure: PORT-A-CATH REMOVAL;  Surgeon: Kieth Brightly Arta Bruce, MD;  Location: [place];  Service: General;  Laterality: N/A;  . PORTACATH PLACEMENT Right 03/24/2019   Procedure: PORT-A-CATH INSERTION under ultrasound and fluoroscopic guidance;  Surgeon: Kinsinger, De Blanch, MD;  Location: [facility] OR;  Service: General;  Laterality: Right;  . REMOVAL OF GASTROSTOMY TUBE N/A 03/24/2019   Procedure: Exchange of Gastrostomy Tube;  Surgeon: Rodman Pickle, MD;  Location: [facility] OR;  Service: General;  Laterality: N/A;  . tethered cord  07/2006  . TONSILLECTOMY     [id number]    Family  History family history includes Allergic rhinitis in her brother, maternal grandmother, mother, and sister; Asthma in her brother, maternal grandmother, and mother; Diabetes type II in her maternal grandmother; Heart murmur in her mother; Lung cancer in her maternal grandfather; Mitochondrial disorder in her brother; Seizures in her brother.   Social History Social History   Social History Narrative   Faizah is at [employer] for associate in arts to transfer to [employer].    She enjoys reading, playing with her cat and her dog, and hanging out with her sister.     Allergies Allergies  Allergen Reactions  . Other Anaphylaxis    Alpha Gal allergy \ Red meat\ any hoof animal  . Latex Rash  . Lactated Ringers     Contraindicated w/ mitochondrial syndrome  . Propofol Other (See Comments)    Due to possible mitochondrial disorder Due to possible mitochondrial disorder Due to possible mitochondrial disorder  . Tape Itching and Other (See Comments)    Medications Current Outpatient Medications on File Prior to Visit  Medication Sig Dispense Refill  . albuterol (PROVENTIL HFA) 108 (90 Base) MCG/ACT inhaler Inhale 2 puffs into the lungs every 4 (four) hours as needed for wheezing or shortness of breath. 6.7 g 2  . ascorbic acid (VITAMIN C) 500 MG tablet Take 500 mg by mouth daily.     Marland Kitchen azelastine (ASTELIN) 0.1 % nasal spray Place 2 sprays into both nostrils 2 (two) times daily. Use in each nostril as directed 30 mL 5  . clobetasol (OLUX) 0.05 % topical foam Apply topically 2 (two) times daily. Apply to affected areas twice daily as needed taking care to avoid axillae and groin area. (Patient taking differently: Apply 1 application topically 2 (two) times daily. Apply to affected areas twice daily as needed taking care to avoid axillae and groin area.) 45 g 3  . Coenzyme Q-10 200 MG CAPS Take 1 capsule by mouth daily.     . cromolyn (GASTROCROM) 100 MG/5ML solution  Take 5 mLs (100 mg total) by mouth 4 (four) times daily -  before meals and at bedtime. (Patient taking differently: Take 100 mg by mouth 4 (four) times daily. ) 600 mL 12  . EPINEPHrine 0.3 mg/0.3 mL IJ SOAJ injection Inject 0.3 mg into the muscle once. As needed    . fludrocortisone (FLORINEF) 0.1 MG tablet Take one tablet each morning. (Patient taking differently: Take 0.1 mg by mouth daily. ) 30 tablet 11  . lansoprazole (PREVACID) 30 MG capsule TAKE 1 CAPSULE BY MOUTH DAILY (Patient taking differently: Take 30 mg by mouth daily. ) 90 capsule 0  . leucovorin (WELLCOVORIN) 25 MG tablet Take 25 mg by mouth 2 (two) times daily.     Marland Kitchen levOCARNitine (CARNITOR) 1 GM/10ML solution TAKE 2 TEASPOONFUL ( ) BY MOUTH TWICEDAILY (Patient taking differently: Place 1,000 mg into feeding tube daily. ) 118 mL 12  . levocetirizine (XYZAL) 5 MG tablet TAKE 1 TABLET BY MOUTH EVERY EVENING (  Patient taking differently: Take 5 mg by mouth every evening. ) 30 tablet 1  . levonorgestrel-ethinyl estradiol (SEASONALE) 0.15-0.03 MG tablet TAKE 1 TABLET BY MOUTH DAILY (Patient taking differently: Take 1 tablet by mouth daily. ) 1 Package 3  . lidocaine (XYLOCAINE) 5 % ointment Apply 1 application topically as needed. (Patient not taking: Reported on 03/18/2019) 35.44 g 3  . metaxalone (SKELAXIN) 400 MG tablet Take 1 tablet (400 mg total) by mouth daily. (Patient taking differently: Take 400 mg by mouth daily as needed (Muslce pain). ) 30 tablet 6  . Nutritional Supplements (ENSURE CLEAR) LIQD 474 mLs by Gastrostomy Tube route daily. (Patient taking differently: 474 mLs by Gastrostomy Tube route 4 (four) times daily. During the day  By mouth or G tube) 60 Bottle 5  . ondansetron (ZOFRAN-ODT) 4 MG disintegrating tablet Take 1 tablet under the tongue every 6-8 hours as needed for nausea. (Patient taking differently: Take 4 mg by mouth every 6 (six) hours as needed for nausea or vomiting. 6-8 hours as needed for nausea.) 30 tablet  5  . pregabalin (LYRICA) 75 MG capsule TAKE 1 CAPSULE BY MOUTH IN THE MORNING AND 2 CAPSULES IN THE EVENING FOR LEG PAIN AND HEADACHE (Patient taking differently: Take 75-150 mg by mouth See admin instructions. TAKE 75 mg in the morning and 150 mg in the evening  FOR LEG PAIN AND HEADACHE) 90 capsule 5  . promethazine (PHENERGAN) 25 MG tablet Take 1 tablet at onset of migraine. May repeat every 6 hours PRN (Patient taking differently: Take 25 mg by mouth every 6 (six) hours as needed for nausea (migraine.). ) 30 tablet 0  . rizatriptan (MAXALT) 10 MG tablet TAKE 1 TABLET BY MOUTH AS NEEDED AS DIRECTED (Patient taking differently: Take 10 mg by mouth as needed for migraine. ) 12 tablet 3  . SYNTHROID 88 MCG tablet TAKE 1 TABLET BY MOUTH EVERY MORNING ON AN EMPTY STOMACH (Patient taking differently: Take 88 mcg by mouth daily. ) 90 tablet 1  . TROKENDI XR 50 MG CP24 TAKE 1 CAPSULE BY MOUTH DAILY (Patient taking differently: Take 50 mg by mouth daily. ) 30 capsule 3   No current facility-administered medications on file prior to visit.   The medication list was reviewed and reconciled. All changes or newly prescribed medications were explained.  A complete medication list was provided to the patient/caregiver.  Physical Exam Vitals deferred due to webex visit Gen: well appearing teen Skin: No rash, No neurocutaneous stigmata. HEENT: Normocephalic, no dysmorphic features, no conjunctival injection, nares patent, mucous membranes moist, oropharynx clear. Resp: normal work of breathing UK:GURKYHC well perfused  Neurological Examination: MS: Awake, alert, interactive. Normal eye contact, answered the questions appropriately for age, speech was fluent,  Normal comprehension.  Attention and concentration were normal. Cranial Nerves: EOM normal, no nystagmus; no ptsosis, face symmetric with full strength of facial muscles, hearing grossly intact.  Motor/Coordination- At least antigravity in all muscle  groups. No abnormal movements. No dysmetria on extension of arms bilaterally.  No difficulty with balance or strength when squatting and standing. Mild tingling with phalen's test.  Gait: Normal gait. Tandem gait was normal. Was able to perform toe walking and heel walking without difficulty  Diagnosis:  1. Mitochondrial disease (HCC)   2. Clubfoot, congenital   3. Port-A-Cath in place   4. Gastrostomy status (HCC)   5. Inadequate fluid intake   6. Carpal tunnel syndrome, unspecified laterality   7. Weakness  Assessment and Plan DOTTY GONZALO is a 19 y.o. female with a complex history of Chairi malformation s/p repair, tethered cord s/p repair, feeding intolerance s/p gastrostomy tube, lactose intolerance and alpha gal allergy, multiple hormonal irregularities including hypothyroidism, chronic headaches, chronic GI complaints, neuropathic pain in her feet, and symptoms concerning for mitochondrial disease now improved on IV fluidswho I am seeing in follow-up. Patient reports improvement with mestinon.  Unsure if there may be some placebo effect, but certainly interesting.  Given no side effects, I recommend increasing doses to see if she can get more full effect, especially if it helps her endurance.  Patient has not heard from [facility] regarding EMG, I recommend she call to follow-up, as this will be the definitive diagnosis for any myasthenic symptoms.  It is also possible her mitochondrial disease responds to mestinon in a secondary way.  Patient symptoms today consistent with carpel tunnel syndrome, which I didn't realize she had been diagnosed with in the past.  No recommended intervention for now, recommend conservative management with hand braces.  However this is also something that can be reviewed with EMG.    Mestinon increased to 120mg  TID  Continue all other medications, no other changes and no refills needed.   Please call [facility] to follow-up on EMG referral.   Referral  to orthopedics for continued shoulder dislocation and pain.  Previously seen by .   Previously discussed referral to adult GI at [facility] for audiology given mother's chronic hearing loss, Dr Gershon Crane and possible sleep study.  WIll follow-up on these appointments at next visit with mom.    Return in about 3 months (around 06/01/2019).  06/03/2019 MD MPH Neurology and Neurodevelopment [facility] Child Neurology  [address] [place], [place], [place] Phone: [phone]   Total time on call: 25 minutes

## 2019-03-04 NOTE — Patient Instructions (Addendum)
-   Continue current feeding regimen. - Please call me if you have any questions or concerns regarding feeds or your formulas.

## 2019-03-07 DIAGNOSIS — Z931 Gastrostomy status: Secondary | ICD-10-CM | POA: Insufficient documentation

## 2019-03-08 ENCOUNTER — Encounter (INDEPENDENT_AMBULATORY_CARE_PROVIDER_SITE_OTHER): Payer: Self-pay | Admitting: *Deleted

## 2019-03-08 ENCOUNTER — Telehealth (INDEPENDENT_AMBULATORY_CARE_PROVIDER_SITE_OTHER): Payer: Self-pay | Admitting: Family

## 2019-03-08 NOTE — Telephone Encounter (Signed)
[name] with [facility] called while at patient's home. She accessed portacath and began infusion at 200cc/hr with plan to increase to prescribed rate of 250cc/hr. Jaylanni began complaining of pain in her neck on the side of the implant. [name] reduced the infusion rate to 150cc/hr and Jarelly reported that pain lessened to 4/10 but was still present. I recommended stopping the infusion because of concern of patency. The port was placed at [facility] by Peds Surgery but since Laura Parrish is now 19 years old I will see her if her local surgeon at [facility] will provide care for the implant. I called Dr Franky Macho Kinsinger's office and spoke with triage nurse [name]. She will talk with Dr Sheliah Hatch and call me back. TG

## 2019-03-14 ENCOUNTER — Other Ambulatory Visit (HOSPITAL_COMMUNITY): Payer: Self-pay | Admitting: General Surgery

## 2019-03-14 DIAGNOSIS — M79601 Pain in right arm: Secondary | ICD-10-CM

## 2019-03-15 ENCOUNTER — Ambulatory Visit (HOSPITAL_COMMUNITY)
Admission: RE | Admit: 2019-03-15 | Discharge: 2019-03-15 | Disposition: A | Discharge status: Home / Self Care | Payer: Commercial Managed Care - PPO | Source: Ambulatory Visit | Attending: Pediatrics | Admitting: Pediatrics

## 2019-03-15 ENCOUNTER — Encounter (INDEPENDENT_AMBULATORY_CARE_PROVIDER_SITE_OTHER): Payer: Self-pay

## 2019-03-15 ENCOUNTER — Other Ambulatory Visit: Payer: Self-pay

## 2019-03-15 DIAGNOSIS — M7989 Other specified soft tissue disorders: Secondary | ICD-10-CM | POA: Diagnosis present

## 2019-03-15 DIAGNOSIS — M79601 Pain in right arm: Secondary | ICD-10-CM | POA: Diagnosis not present

## 2019-03-15 NOTE — Progress Notes (Signed)
Right upper extremity venous duplex has been completed. Preliminary results can be found in CV Proc through chart review.  Results were given to [name] at Dr. Guerry Minors office.  03/15/19 1:16 PM [name] RVT

## 2019-03-21 ENCOUNTER — Other Ambulatory Visit (HOSPITAL_COMMUNITY)
Admission: RE | Admit: 2019-03-21 | Discharge: 2019-03-21 | Disposition: A | Discharge status: Home / Self Care | Payer: Commercial Managed Care - PPO | Source: Ambulatory Visit | Attending: General Surgery | Admitting: General Surgery

## 2019-03-21 DIAGNOSIS — Z79890 Hormone replacement therapy (postmenopausal): Secondary | ICD-10-CM | POA: Diagnosis not present

## 2019-03-21 DIAGNOSIS — T82594A Other mechanical complication of infusion catheter, initial encounter: Secondary | ICD-10-CM | POA: Diagnosis not present

## 2019-03-21 DIAGNOSIS — Y838 Other surgical procedures as the cause of abnormal reaction of the patient, or of later complication, without mention of misadventure at the time of the procedure: Secondary | ICD-10-CM | POA: Diagnosis not present

## 2019-03-21 DIAGNOSIS — K219 Gastro-esophageal reflux disease without esophagitis: Secondary | ICD-10-CM | POA: Diagnosis not present

## 2019-03-21 DIAGNOSIS — Z793 Long term (current) use of hormonal contraceptives: Secondary | ICD-10-CM | POA: Diagnosis not present

## 2019-03-21 DIAGNOSIS — K9423 Gastrostomy malfunction: Secondary | ICD-10-CM | POA: Diagnosis not present

## 2019-03-21 DIAGNOSIS — E039 Hypothyroidism, unspecified: Secondary | ICD-10-CM | POA: Diagnosis not present

## 2019-03-21 DIAGNOSIS — Z20822 Contact with and (suspected) exposure to covid-19: Secondary | ICD-10-CM | POA: Diagnosis not present

## 2019-03-21 DIAGNOSIS — Z79899 Other long term (current) drug therapy: Secondary | ICD-10-CM | POA: Diagnosis not present

## 2019-03-21 DIAGNOSIS — Z7952 Long term (current) use of systemic steroids: Secondary | ICD-10-CM | POA: Diagnosis not present

## 2019-03-22 ENCOUNTER — Ambulatory Visit: Payer: Self-pay | Admitting: General Surgery

## 2019-03-22 LAB — SARS CORONAVIRUS 2 (TAT 6-24 HRS): SARS Coronavirus 2: NEGATIVE

## 2019-03-23 ENCOUNTER — Encounter (HOSPITAL_COMMUNITY): Payer: Self-pay | Admitting: General Surgery

## 2019-03-23 ENCOUNTER — Other Ambulatory Visit: Payer: Self-pay

## 2019-03-23 NOTE — Progress Notes (Signed)
Spoke with Mother Zamoria Boss [phone] who states patient does not have SOB, fever, cough or chest pain.  PCP - Dr Army Melia Peds Cardiologist - Dr [name] Essex Peds Endocrinology - Dr Molli Knock Peds Neuro and Complex Care Coordinator for patient -Dr [name] GI - Dr Truitt Merle Pulmonologist Dr Kalman Jewels  Chest x-ray -10/15/18, 2 View  EKG - 12/24/17 CE Stress Test - denies ECHO Ped - 12/24/17 [facility] Cardiac Cath - denies  ERAS: Clears til 12 noon DOS, no drink.  Per Dr Krista Blue, no tube feeding after 7 am DOS.  Patient will arrive early if she feels like she is getting dehydrated and needs IV fluids prior to surgery.  Mother Cipriano Mile wants to be with patient in SS and PACU.  Anesthesia review: Yes  STOP now taking any Aspirin (unless otherwise instructed by your surgeon), Aleve, Naproxen, Ibuprofen, Motrin, Advil, Goody's, BC's, all herbal medications, fish oil, and all vitamins.   Coronavirus Screening Covid test on 03/21/19 was negative.  Mother Cipriano Mile verbalized understanding of instructions that were given via phone.

## 2019-03-23 NOTE — Anesthesia Preprocedure Evaluation (Addendum)
Anesthesia Evaluation  Patient identified by MRN, date of birth, ID band Patient awake    Reviewed: Allergy & Precautions, NPO status , Patient's Chart, lab work & pertinent test results  History of Anesthesia Complications (+) PONV and history of anesthetic complications  Airway Mallampati: II  TM Distance: >3 FB Neck ROM: Full    Dental no notable dental hx. (+) Dental Advisory Given   Pulmonary neg pulmonary ROS,    Pulmonary exam normal        Cardiovascular Normal cardiovascular exam   autonomic dysfunction  Echo: Summary:  1. Trivial tricuspid valve regurgitation.  2. Normal left ventricular cavity size and systolic function.  3. Normal right ventricular cavity size and systolic function.  4. Right ventricular systolic pressure estimate = 22.1 mmHg.  5. Structurally normal heart   Normal echocardiogram.   Neuro/Psych negative neurological ROS     GI/Hepatic Neg liver ROS, GERD  ,  Endo/Other  Hypothyroidism Mitochondrial disease  Renal/GU negative Renal ROS     Musculoskeletal negative musculoskeletal ROS (+)   Abdominal   Peds  Hematology negative hematology ROS (+)   Anesthesia Other Findings Day of surgery medications reviewed with the patient.  Reproductive/Obstetrics                            Anesthesia Physical Anesthesia Plan  ASA: III  Anesthesia Plan: General   Post-op Pain Management:    Induction: Intravenous  PONV Risk Score and Plan: 3 and Scopolamine patch - Pre-op, Ondansetron and Dexamethasone  Airway Management Planned: LMA  Additional Equipment:   Intra-op Plan:   Post-operative Plan: Extubation in OR  Informed Consent: I have reviewed the patients History and Physical, chart, labs and discussed the procedure including the risks, benefits and alternatives for the proposed anesthesia with the patient or authorized representative who has  indicated his/her understanding and acceptance.     Dental advisory given  Plan Discussed with: Anesthesiologist  Anesthesia Plan Comments: (Etomidate for induction  See PAT note written 03/23/2019 by Shonna Chock, PA-C.   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 EMG for Myasthenia gravis pending--she in on mestinon (by notes, EMG was borderline for neuropathy 2008).    Prone to hypoglycemia and has G-tube. IVF should contain dextrose and electrolytes.  AVOID LR and PROPOFOL. May 2020 anesthesia records summarized in PAT note--can also be viewed in [facility]. Also with alpha-gal allergy, so should avoid red meat and gelatin containing capsules.    "Metabolic Precautions & ER Recommendations" by Venia Carbon, MD and is provided as a resource through [employer] summarizes the following anesthesia considerations in individuals with mitochondrial disease: . Questions on anesthetic sensitivity in mitochondrial patients remain . Some individuals with mitochondrial metabolic diseases are more sensitive to volatile anesthetics and need a much lower dose to achieve a bispectral (BIS) index of <60. This effect has been seen more in patients with reduced complex 1 capacity. Sevoflurane might be better tolerated than isoflurane and halothane . Debate remains as to the potential risk of propofol administration in mitochondrial disease patients. However, propofol has been routinely used in many mitochondrial patients for brief periods of sedation (less than 30-60 minutes) without apparent clinical problems. Limiting propofol use to short procedures and brief periods of sedation is advisable for now  )      Anesthesia Quick Evaluation

## 2019-03-23 NOTE — Progress Notes (Signed)
Anesthesia Chart Review: Laura Parrish   Case: [id number] Date/Time: 03/24/19 1445   Procedures:      PORT-A-CATH REMOVAL (N/A )     PORT-A-CATH INSERTION (N/A )   Anesthesia type: Choice   Pre-op diagnosis: CHIARI MALFORMATION   [facility]: [facility] ROOM 02 / [facility]   Surgeons: Kinsinger, De Blanch, MD      DISCUSSION: Patient is a 19 year old female scheduled for the above procedure.  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 Positive allergy testing for alpha-gal (elevated IgE antibodies to galactose-alpha-1,3-galactose of 1.00 on 04/10/16, indicating risk for delayed anaphylaxis, angioedema, or urticaria following consumption of beef pork, or lamb).  She is slow to wake up from anesthesia.   According to neurologist Dr. Blair Heys 11/19/18 note, Eden has a "working diagnosis of mitochondrial disease although testing negative.Marland KitchenMarland KitchenPatient complaining of weakness and with positive symptoms that sounds like myasthenia gravis.  Patient has had previous EMG testing and antibody testing for myasthenia that were negative, but last EMG at 19yo was borderline for neurolpathy, but unclear if single fiber EMG was completed.  Given her symptoms, I would like to try a trial of mestinon and repeat EMG. This may give some information to explain her difficulty in breathing and PFTs.  I explained to mother however that this is experimental and may not work.  It's possible she has a "myasthenia-like" presentation, but not myasthenia.  Single fiber EMG is very sensitive for muscle fatigue and repeat testing will help give a final diagnosis.  Referral sent. In the meantime, will increase frequency of  IV fluids, as suggested by Dr Fransico Michael, in an attempt to improve her function."    EMG testing to re-evaluate for myasthenia gravis had not been done yet. She is on mestinon.   Propofol (can impair mitochondrial function to a great degree than other anesthetics) and LR (because it contains lactic acid) are listed as allergies due to mitochondrial disease.   "Metabolic Precautions & ER Recommendations" by Venia Carbon, MD and is provided as a resource through [employer] summarizes the following anesthesia considerations in individuals with mitochondrial disease: . Questions on anesthetic sensitivity in mitochondrial patients remain . Some individuals with mitochondrial metabolic diseases are more sensitive to volatile anesthetics and need a much lower dose to achieve a bispectral (BIS) index of <60. This effect has been seen more in patients with reduced complex 1 capacity. Sevoflurane might be better tolerated than isoflurane and halothane . Debate remains as to the potential risk of propofol administration in mitochondrial disease patients. However, propofol has been routinely used in many mitochondrial patients for brief periods of sedation (less than 30-60 minutes) without apparent clinical problems. Limiting propofol use to short procedures and brief periods of sedation is advisable for now  Her latest Anesthesia Records from [facility] 05/28/18 are outlined below (see Care Everywhere): Meds Name Total  lidocaine 2 % 80 mg  fentaNYL  100 mcg  ondansetron 4 mg  succinylcholine (ANECTINE) 20 mg/ml 60 mg  scopolamine (TRANSDERM-SCOP) patch 1.5 mg/72 hr 1 patch  dexmedetomidine (PRECEDEX) injection 100 mcg/mL 20 mcg  etomidate (AMIDATE) IV 2 mg/ml 20 mg  ceFAZolin (ANCEF) IV 1 g 2 g  dexamethasone (DECADRON) 4 mg/ml 4 mg  ephedrine (PF) 5 mg/ml 10 mg  sodium chloride  0.9% (NS) infusion 700 mL   Agents Name  O2  Air  Sevo ET    Peripheral IV (Ped):  05/27/18;  1710; 22 G; Left; Antecubital; Chlorhexidine ; Comfort measures; None; Ultrasound guidance ETT: Placement Date: 05/28/18; Placement Time: 1756; Pre O2/Mask induction: Preoxygenated with 100% O2 by face mask; Mask ventilation: easy; Size: 6.5; Secured at: 20 cm; ETT Type: Oral; Cuffed: Cuffed; Insertion attempts: 1; View: I; Blade: Mil 2; Type of view: direct laryngoscopy; Airway Assistance: Anterior Laryngeal Pressure; Intubation Trauma: Atraumatic Placement; Placement Assessment: Equal Bilateral Breath Sounds, Positive ETCO2; Placed By: CRNA; Removal Date: 05/28/18; Removal Time: 1921  By notes, she receives most of her nutrition from nightly tube feeds (Pediasure 1.5 Cal and Ensure) and receives 3L of D5 weekly for fatigue. She can tolerate limited quantities of oral intake. Case is currently scheduled to start at 3:00 PM. ERAS protocol has been ordered by surgeon. Discussed with anesthesiologist Tamela Gammon, MD. Tube feedings should be off 8 hours before surgery. Clear liquids per ERAS protocol. She has a history of positive alpha gal--gelatin capsules can also be a source of alpha-gal reaction, so this should be considered with medications. Patient's mother Laretta Alstrom is aware that [place] may arrive early to PAT for hydration and IVF should she have difficultly with oral clear liquids (ERAS). It is important to avoid prolonged NPO and want to avoid hypoglycemia. Will check a CBG with her pre-operative labs.    VS:  BP Readings from Last 3 Encounters:  02/10/19 108/66  11/19/18 98/60  11/19/18 98/60   Pulse Readings from Last 3 Encounters:  02/10/19 64  11/19/18 84  11/19/18 84     PROVIDERS: Janne Napoleon, MD is listed as PCP - Carylon Perches, MD is neurologist ([facility]) - Tillman Sers, MD is endocrinologist. Last visit 02/10/19. Normal thyroid panel then. A1c 5.0%.  Aviva Signs, MD is GI ([place]) - Pat Patrick, MD is pulmonologist.  Last evaluation 11/19/18. Etiology of dyspnea unclear. Sleep study, EMG testing (per neurology), and repeat spirometry ordered.  Philis Kendall, MD is pediatric cardiologist ([place]). Last visit 12/24/17 for suspected mitochondrial disorder with current no cardiac involvement. She had a normal EKG and echocardiogram. Every 2 year evaluations recommended. She does have known swelling that he felt was not due to cardiac cause.    LABS: Last lab results include: Lab Results  Component Value Date   WBC 8.8 02/01/2018   HGB 14.8 02/01/2018   HCT 44.5 02/01/2018   PLT 375 02/01/2018   GLUCOSE 83 02/10/2019   ALT 9 02/10/2019   AST 12 02/10/2019   NA 140 02/10/2019   K 3.8 02/10/2019   CL 108 02/10/2019   CREATININE 0.85 02/10/2019   BUN 8 02/10/2019   CO2 22 02/10/2019   TSH 0.73 02/10/2019   HGBA1C 5.0 02/10/2019      OTHER: Spirometry 18/21 ([facility]): FVC PRE 3.99 3.05 - 4.57 L  FEV.75 PRE 3.15 L  FEV1 PRE 3.45 2.7 - 4 L  FEV1/FVC PRE 86.47 77.09 - 100.08 %  FEV6 PRE 3.99 3.13 - 4.49 L  FEV1/FEV6 PRE 86.49 78.34 - 95.94 %  FEF25-75% PRE 4 2.64 - 5.28 L/s  ISOFEF25-75 PRE 4 L/s  FEF50% PRE 4.59 2.72 - 6.34 L/s  PEF PRE 6.62 5.1 - 8.54 L/s  FET100% Change 6.65 sec  FIVC PRE 0 (L) 3.05 - 4.57 L  Vol extrap pre 0.09 L  IC PRE  2.4 L  VC PRE 3.99 3.05 - 4.57 L  TLC PRE 4.76 3.98 - 5.96 L  RV PRE 0.77 (L) 0.77 - 1.93 L  RV/TLC PRE 16.08 (L) 17.87 - 37.05 %  FRC PL PRE 2.36 L  ERV PRE 1.44 (H) 1.33 - 1.33 L  PI Max PRE 40 (L) 114.27 - 114.27 cmH2O  PE Max Predicted 29 (L) 44.05 - 148.95 cmH2O  - Per Dr. Waynette Buttery, "PFT's show normal spirometry and lung volume, low but improved MIP/MEP's - likely related to general fatigue."    Routine Polysomnogram Study 01/20/19 [facility]): Impression 1. 1. No evidence for clinically significant sleep disordered breathing.  Oxygen saturations and End tidal C02 were in normal range. No evidence of   Hypoventilation.    IMAGES: CXR 10/15/18: FINDINGS: Port-A-Cath tip is in the superior vena cava. No pneumothorax. No edema or consolidation. Heart size and pulmonary vascularity are normal. No adenopathy. No bone lesions. IMPRESSION: No edema or consolidation. No adenopathy. Port-A-Cath tip in superior vena cava.   EKG: 12/24/17 [facility]): By report summary in Care Everywhere, "NORMAL SINUS RHYTHM" EKG Ventricular Rate 76 BPM  EKG Atrial Rate 76 BPM  EKG P-R Interval 148 ms  EKG QRS Duration 86 ms  EKG Q-T Interval 378 ms  EKG QTC Calculation 425 ms    CV: BUE Venous US 03/15/19: Summary:  Right:  No evidence of deep vein thrombosis in the upper extremity. No evidence of  superficial vein thrombosis in the upper extremity.  Left:  No evidence of thrombosis in the subclavian.   Echo 12/24/17 [facility]) Summary:  1. Trivial tricuspid valve regurgitation.  2. Normal left ventricular cavity size and systolic function.  3. Normal right ventricular cavity size and systolic function.  4. Right ventricular systolic pressure estimate = 22.1 mmHg.  5. Structurally normal heart   Normal echocardiogram.   Past Medical History:  Diagnosis Date  . Allergy to alpha-gal    elevated IgE 04/10/16  . Autonomic dysfunction   . Chiari I malformation (HCC)   . Eczema   . Hypoglycemia   . Hypothyroid   . Mitochondrial disease [facility])     Past Surgical History:  Procedure Laterality Date  . ADENOIDECTOMY    . chiari decompression    . GALLBLADDER SURGERY    . GASTROSTOMY TUBE PLACEMENT    . MUSCLE BIOPSY    . Port Replacement  05/2018  . tethered cord  07/2006  . TONSILLECTOMY      MEDICATIONS: No current facility-administered medications for this encounter.   Marland Kitchen albuterol (PROVENTIL HFA) 108 (90 Base) MCG/ACT inhaler  . ascorbic acid (VITAMIN C) 500 MG tablet  . azelastine (ASTELIN) 0.1 % nasal spray  . clobetasol (OLUX) 0.05 % topical foam  . Coenzyme Q-10 200 MG CAPS   . cromolyn (GASTROCROM) 100 MG/5ML solution  . EPINEPHrine 0.3 mg/0.3 mL IJ SOAJ injection  . fludrocortisone (FLORINEF) 0.1 MG tablet  . lansoprazole (PREVACID) 30 MG capsule  . leucovorin (WELLCOVORIN) 25 MG tablet  . levOCARNitine (CARNITOR) 1 GM/10ML solution  . levocetirizine (XYZAL) 5 MG tablet  . levonorgestrel-ethinyl estradiol (SEASONALE) 0.15-0.03 MG tablet  . metaxalone (SKELAXIN) 400 MG tablet  . Nutritional Supplements (ENSURE CLEAR) LIQD  . Nutritional Supplements (FEEDING SUPPLEMENT, VITAL 1.5 CAL,) LIQD  . ondansetron (ZOFRAN-ODT) 4 MG disintegrating tablet  . pregabalin (LYRICA) 75 MG capsule  . PRESCRIPTION MEDICATION  . promethazine (PHENERGAN) 25 MG tablet  . pyridostigmine (MESTINON) 60 MG tablet  .  rizatriptan (MAXALT) 10 MG tablet  . SYNTHROID 88 MCG tablet  . TROKENDI XR 50 MG CP24  . lidocaine (XYLOCAINE) 5 % ointment    Shonna Chock, PA-C Surgical Short Stay/Anesthesiology [facility] Phone [phone] [facility] Phone [phone] 03/23/2019 4:38 PM

## 2019-03-24 ENCOUNTER — Inpatient Hospital Stay (HOSPITAL_COMMUNITY): Payer: Commercial Managed Care - PPO

## 2019-03-24 ENCOUNTER — Inpatient Hospital Stay (HOSPITAL_COMMUNITY): Payer: Commercial Managed Care - PPO | Admitting: Vascular Surgery

## 2019-03-24 ENCOUNTER — Other Ambulatory Visit: Payer: Self-pay

## 2019-03-24 ENCOUNTER — Encounter (HOSPITAL_COMMUNITY): Payer: Self-pay | Admitting: General Surgery

## 2019-03-24 ENCOUNTER — Encounter (HOSPITAL_COMMUNITY)
Admission: RE | Disposition: A | Discharge status: Home / Self Care | Payer: Self-pay | Source: Home / Self Care | Attending: General Surgery

## 2019-03-24 ENCOUNTER — Ambulatory Visit (HOSPITAL_COMMUNITY)
Admission: RE | Admit: 2019-03-24 | Discharge: 2019-03-24 | Disposition: A | Discharge status: Home / Self Care | Payer: Commercial Managed Care - PPO | Attending: General Surgery | Admitting: General Surgery

## 2019-03-24 DIAGNOSIS — E039 Hypothyroidism, unspecified: Secondary | ICD-10-CM | POA: Insufficient documentation

## 2019-03-24 DIAGNOSIS — Z419 Encounter for procedure for purposes other than remedying health state, unspecified: Secondary | ICD-10-CM

## 2019-03-24 DIAGNOSIS — Z793 Long term (current) use of hormonal contraceptives: Secondary | ICD-10-CM | POA: Insufficient documentation

## 2019-03-24 DIAGNOSIS — Z79899 Other long term (current) drug therapy: Secondary | ICD-10-CM | POA: Insufficient documentation

## 2019-03-24 DIAGNOSIS — Z20822 Contact with and (suspected) exposure to covid-19: Secondary | ICD-10-CM | POA: Insufficient documentation

## 2019-03-24 DIAGNOSIS — T82594A Other mechanical complication of infusion catheter, initial encounter: Secondary | ICD-10-CM

## 2019-03-24 DIAGNOSIS — Z7952 Long term (current) use of systemic steroids: Secondary | ICD-10-CM | POA: Insufficient documentation

## 2019-03-24 DIAGNOSIS — Z79890 Hormone replacement therapy (postmenopausal): Secondary | ICD-10-CM | POA: Insufficient documentation

## 2019-03-24 DIAGNOSIS — Y838 Other surgical procedures as the cause of abnormal reaction of the patient, or of later complication, without mention of misadventure at the time of the procedure: Secondary | ICD-10-CM | POA: Insufficient documentation

## 2019-03-24 DIAGNOSIS — K9423 Gastrostomy malfunction: Secondary | ICD-10-CM | POA: Insufficient documentation

## 2019-03-24 DIAGNOSIS — K219 Gastro-esophageal reflux disease without esophagitis: Secondary | ICD-10-CM | POA: Insufficient documentation

## 2019-03-24 HISTORY — DX: Gastro-esophageal reflux disease without esophagitis: K21.9

## 2019-03-24 HISTORY — DX: Pneumonia, unspecified organism: J18.9

## 2019-03-24 HISTORY — DX: Myalgia, unspecified site: M79.10

## 2019-03-24 HISTORY — DX: Other complications of anesthesia, initial encounter: T88.59XA

## 2019-03-24 HISTORY — PX: PORT-A-CATH REMOVAL: SHX5289

## 2019-03-24 HISTORY — DX: Allergy to other foods: Z91.018

## 2019-03-24 HISTORY — DX: Disorder of the autonomic nervous system, unspecified: G90.9

## 2019-03-24 HISTORY — DX: Cardiac murmur, unspecified: R01.1

## 2019-03-24 HISTORY — DX: Nausea with vomiting, unspecified: Z98.890

## 2019-03-24 HISTORY — DX: Nausea with vomiting, unspecified: R11.2

## 2019-03-24 HISTORY — DX: Dyspnea, unspecified: R06.00

## 2019-03-24 HISTORY — DX: Pain in unspecified joint: M25.50

## 2019-03-24 HISTORY — DX: Irritable bowel syndrome, unspecified: K58.9

## 2019-03-24 HISTORY — PX: REMOVAL OF GASTROSTOMY TUBE: SHX6058

## 2019-03-24 HISTORY — DX: Headache, unspecified: R51.9

## 2019-03-24 HISTORY — PX: PORTACATH PLACEMENT: SHX2246

## 2019-03-24 LAB — CBC
HCT: 50.1 % — ABNORMAL HIGH (ref 36.0–46.0)
Hemoglobin: 16.6 g/dL — ABNORMAL HIGH (ref 12.0–15.0)
MCH: 29.1 pg (ref 26.0–34.0)
MCHC: 33.1 g/dL (ref 30.0–36.0)
MCV: 87.9 fL (ref 80.0–100.0)
Platelets: 372 10*3/uL (ref 150–400)
RBC: 5.7 MIL/uL — ABNORMAL HIGH (ref 3.87–5.11)
RDW: 12.7 % (ref 11.5–15.5)
WBC: 9.8 10*3/uL (ref 4.0–10.5)
nRBC: 0 % (ref 0.0–0.2)

## 2019-03-24 LAB — BASIC METABOLIC PANEL
Anion gap: 14 (ref 5–15)
BUN: 13 mg/dL (ref 6–20)
CO2: 18 mmol/L — ABNORMAL LOW (ref 22–32)
Calcium: 9.9 mg/dL (ref 8.9–10.3)
Chloride: 109 mmol/L (ref 98–111)
Creatinine, Ser: 0.97 mg/dL (ref 0.44–1.00)
GFR calc Af Amer: >60 mL/min (ref >60)
GFR calc non Af Amer: >60 mL/min (ref >60)
Glucose, Bld: 81 mg/dL (ref 70–99)
Potassium: 5.2 mmol/L — ABNORMAL HIGH (ref 3.5–5.1)
Sodium: 141 mmol/L (ref 135–145)

## 2019-03-24 LAB — POCT PREGNANCY, URINE: Preg Test, Ur: NEGATIVE

## 2019-03-24 LAB — GLUCOSE, CAPILLARY
Glucose-Capillary: 86 mg/dL (ref 70–99)
Glucose-Capillary: 90 mg/dL (ref 70–99)

## 2019-03-24 IMAGING — DX DG CHEST 1V PORT
1 series · 1 of 1 positions shown · non-contrast
Comparison: Radiograph [DATE]

CLINICAL DATA: Central line clotted, initial encounter. Port-A-Cath
in place.

EXAM:
PORTABLE CHEST 1 VIEW

[chest]
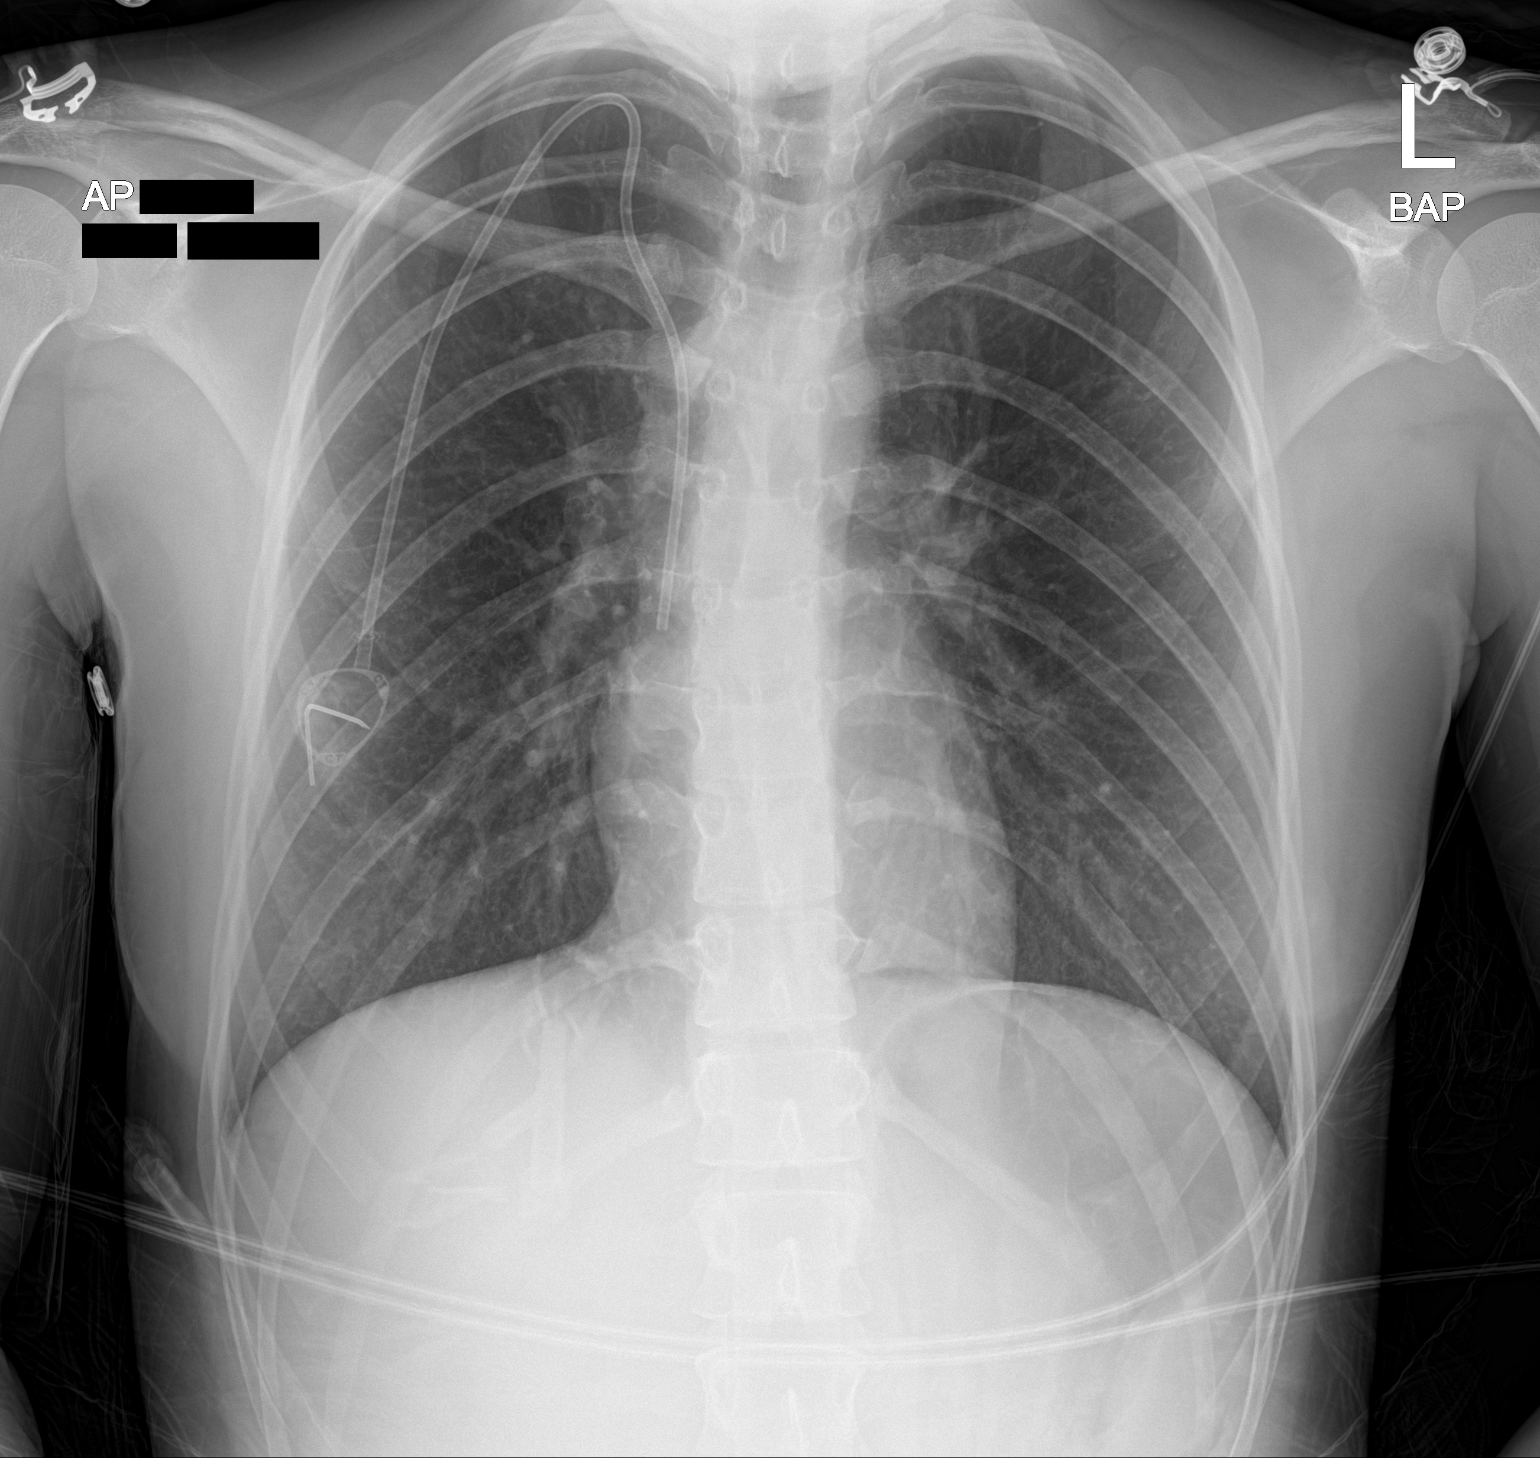

[1 of 1 positions shown; findings below may reference images not displayed]

FINDINGS: Tip of the right chest port unchanged in the lower SVC.The
cardiomediastinal contours are normal. The lungs are clear.
Pulmonary vasculature is normal. No consolidation, pleural effusion,
or pneumothorax. No acute osseous abnormalities are seen.
IMPRESSION: Tip of the right chest port in the lower SVC.  No acute findings.

## 2019-03-24 SURGERY — REMOVAL PORT-A-CATH
Anesthesia: General | Site: Neck | Laterality: Right

## 2019-03-24 MED ORDER — FENTANYL CITRATE (PF) 250 MCG/5ML IJ SOLN
INTRAMUSCULAR | Status: AC
  Filled 2019-03-24: qty 5

## 2019-03-24 MED ORDER — GABAPENTIN 300 MG PO CAPS: 300.0000 mg | ORAL_CAPSULE | ORAL | Status: DC

## 2019-03-24 MED ORDER — KETOROLAC TROMETHAMINE 15 MG/ML IJ SOLN
INTRAMUSCULAR | Status: AC
  Administered 2019-03-24: 15 mg via INTRAVENOUS
  Filled 2019-03-24: qty 1

## 2019-03-24 MED ORDER — MIDAZOLAM HCL 2 MG/2ML IJ SOLN
INTRAMUSCULAR | Status: AC
  Filled 2019-03-24: qty 2

## 2019-03-24 MED ORDER — LIDOCAINE 2% (20 MG/ML) 5 ML SYRINGE
INTRAMUSCULAR | Status: DC | PRN
  Administered 2019-03-24: 60 mg via INTRAVENOUS

## 2019-03-24 MED ORDER — SCOPOLAMINE 1 MG/3DAYS TD PT72
MEDICATED_PATCH | TRANSDERMAL | Status: AC
  Filled 2019-03-24: qty 1

## 2019-03-24 MED ORDER — KETOROLAC TROMETHAMINE 15 MG/ML IJ SOLN: 15.0000 mg | INTRAMUSCULAR | Status: AC

## 2019-03-24 MED ORDER — ETOMIDATE 2 MG/ML IV SOLN
INTRAVENOUS | Status: DC | PRN
  Administered 2019-03-24: 20 mg via INTRAVENOUS

## 2019-03-24 MED ORDER — CHLORHEXIDINE GLUCONATE CLOTH 2 % EX PADS: 6.0000 | MEDICATED_PAD | Freq: Once | CUTANEOUS | Status: DC

## 2019-03-24 MED ORDER — EPHEDRINE SULFATE 50 MG/ML IJ SOLN
INTRAMUSCULAR | Status: DC | PRN
  Administered 2019-03-24 (×2): 5 mg via INTRAVENOUS

## 2019-03-24 MED ORDER — ONDANSETRON HCL 4 MG/2ML IJ SOLN
INTRAMUSCULAR | Status: DC | PRN
  Administered 2019-03-24: 4 mg via INTRAVENOUS

## 2019-03-24 MED ORDER — CEFAZOLIN SODIUM-DEXTROSE 2-4 GM/100ML-% IV SOLN
INTRAVENOUS | Status: AC
  Filled 2019-03-24: qty 100

## 2019-03-24 MED ORDER — ONDANSETRON HCL 4 MG/2ML IJ SOLN
INTRAMUSCULAR | Status: AC
  Filled 2019-03-24: qty 2

## 2019-03-24 MED ORDER — OXYCODONE HCL 5 MG PO TABS: 5.0000 mg | ORAL_TABLET | Freq: Four times a day (QID) | ORAL | 0 refills | Status: DC | PRN

## 2019-03-24 MED ORDER — FENTANYL CITRATE (PF) 100 MCG/2ML IJ SOLN
INTRAMUSCULAR | Status: DC | PRN
  Administered 2019-03-24 (×3): 50 ug via INTRAVENOUS

## 2019-03-24 MED ORDER — IBUPROFEN 800 MG PO TABS: 800.0000 mg | ORAL_TABLET | Freq: Three times a day (TID) | ORAL | 0 refills | Status: DC | PRN

## 2019-03-24 MED ORDER — MIDAZOLAM HCL 5 MG/5ML IJ SOLN
INTRAMUSCULAR | Status: DC | PRN
  Administered 2019-03-24: 2 mg via INTRAVENOUS

## 2019-03-24 MED ORDER — SODIUM CHLORIDE 0.9 % IV SOLN
INTRAVENOUS | Status: DC | PRN
  Administered 2019-03-24: 500 mL

## 2019-03-24 MED ORDER — DEXAMETHASONE SODIUM PHOSPHATE 10 MG/ML IJ SOLN
INTRAMUSCULAR | Status: AC
  Filled 2019-03-24: qty 1

## 2019-03-24 MED ORDER — CEFAZOLIN SODIUM-DEXTROSE 2-4 GM/100ML-% IV SOLN
2.0000 g | INTRAVENOUS | Status: AC
  Administered 2019-03-24: 2 g via INTRAVENOUS

## 2019-03-24 MED ORDER — BUPIVACAINE HCL 0.25 % IJ SOLN
INTRAMUSCULAR | Status: DC | PRN
  Administered 2019-03-24: 17 mL

## 2019-03-24 MED ORDER — BUPIVACAINE HCL (PF) 0.25 % IJ SOLN
INTRAMUSCULAR | Status: AC
  Filled 2019-03-24: qty 30

## 2019-03-24 MED ORDER — SODIUM CHLORIDE 0.9 % IV SOLN
INTRAVENOUS | Status: AC
  Filled 2019-03-24: qty 1.2

## 2019-03-24 MED ORDER — ACETAMINOPHEN 500 MG PO TABS
ORAL_TABLET | ORAL | Status: AC
  Administered 2019-03-24: 1000 mg via ORAL
  Filled 2019-03-24: qty 2

## 2019-03-24 MED ORDER — DEXAMETHASONE SODIUM PHOSPHATE 4 MG/ML IJ SOLN
INTRAMUSCULAR | Status: DC | PRN
  Administered 2019-03-24: 4 mg via INTRAVENOUS

## 2019-03-24 MED ORDER — 0.9 % SODIUM CHLORIDE (POUR BTL) OPTIME
TOPICAL | Status: DC | PRN
  Administered 2019-03-24: 1000 mL

## 2019-03-24 MED ORDER — ACETAMINOPHEN 500 MG PO TABS: 1000.0000 mg | ORAL_TABLET | ORAL | Status: AC

## 2019-03-24 MED ORDER — DEXTROSE-NACL 5-0.9 % IV SOLN
INTRAVENOUS | Status: DC
  Filled 2019-03-24 (×2): qty 1000

## 2019-03-24 MED ORDER — HEPARIN SOD (PORK) LOCK FLUSH 100 UNIT/ML IV SOLN
INTRAVENOUS | Status: DC | PRN
  Administered 2019-03-24: 500 [IU] via INTRAVENOUS

## 2019-03-24 MED ORDER — HEPARIN SOD (PORK) LOCK FLUSH 100 UNIT/ML IV SOLN
INTRAVENOUS | Status: AC
  Filled 2019-03-24: qty 5

## 2019-03-24 SURGICAL SUPPLY — 37 items
ADH SKN CLS APL DERMABOND .7 (GAUZE/BANDAGES/DRESSINGS) ×2
BAG DECANTER FOR FLEXI CONT (MISCELLANEOUS) ×3 IMPLANT
CHLORAPREP W/TINT 10.5 ML (MISCELLANEOUS) ×3 IMPLANT
COVER SURGICAL LIGHT HANDLE (MISCELLANEOUS) ×3 IMPLANT
COVER TRANSDUCER ULTRASND GEL (DRAPE) ×3 IMPLANT
COVER WAND RF STERILE (DRAPES) ×3 IMPLANT
DERMABOND ADVANCED (GAUZE/BANDAGES/DRESSINGS) ×1
DERMABOND ADVANCED .7 DNX12 (GAUZE/BANDAGES/DRESSINGS) ×2 IMPLANT
DRAPE C-ARM 42X120 X-RAY (DRAPES) ×3 IMPLANT
DRAPE CHEST BREAST 15X10 FENES (DRAPES) ×3 IMPLANT
ELECT REM PT RETURN 9FT ADLT (ELECTROSURGICAL) ×3
ELECTRODE REM PT RTRN 9FT ADLT (ELECTROSURGICAL) ×2 IMPLANT
GLOVE BIOGEL PI IND STRL 7.0 (GLOVE) ×2 IMPLANT
GLOVE BIOGEL PI INDICATOR 7.0 (GLOVE) ×1
GLOVE SURG SS PI 7.0 STRL IVOR (GLOVE) ×3 IMPLANT
GOWN STRL REUS W/ TWL LRG LVL3 (GOWN DISPOSABLE) ×4 IMPLANT
GOWN STRL REUS W/TWL LRG LVL3 (GOWN DISPOSABLE) ×6
INTRODUCER COOK 11FR (CATHETERS) IMPLANT
KIT BASIN OR (CUSTOM PROCEDURE TRAY) ×3 IMPLANT
KIT PORT POWER 8FR ISP CVUE (Port) ×1 IMPLANT
KIT TURNOVER KIT B (KITS) ×3 IMPLANT
NEEDLE 22X1 1/2 (OR ONLY) (NEEDLE) ×3 IMPLANT
NS IRRIG 1000ML POUR BTL (IV SOLUTION) ×3 IMPLANT
PAD ARMBOARD 7.5X6 YLW CONV (MISCELLANEOUS) ×3 IMPLANT
PENCIL SMOKE EVACUATOR (MISCELLANEOUS) ×3 IMPLANT
POSITIONER HEAD DONUT 9IN (MISCELLANEOUS) ×3 IMPLANT
SET INTRODUCER 12FR PACEMAKER (INTRODUCER) IMPLANT
SET SHEATH INTRODUCER 10FR (MISCELLANEOUS) IMPLANT
SHEATH COOK PEEL AWAY SET 9F (SHEATH) IMPLANT
SUT MNCRL AB 4-0 PS2 18 (SUTURE) ×3 IMPLANT
SUT PROLENE 2 0 SH DA (SUTURE) ×3 IMPLANT
SUT VIC AB 3-0 SH 27 (SUTURE) ×6
SUT VIC AB 3-0 SH 27X BRD (SUTURE) ×2 IMPLANT
SYR 5ML LUER SLIP (SYRINGE) ×3 IMPLANT
TOWEL GREEN STERILE (TOWEL DISPOSABLE) ×3 IMPLANT
TOWEL GREEN STERILE FF (TOWEL DISPOSABLE) ×3 IMPLANT
TRAY LAPAROSCOPIC MC (CUSTOM PROCEDURE TRAY) ×3 IMPLANT

## 2019-03-24 NOTE — Op Note (Signed)
Preoperative diagnosis: nonfunctional port-a-cath, dysfunctional gastrostomy  Postoperative diagnosis: same  Procedure: insertion of right IJ port-a-cath with ultrasound and fluoro guidance, removal of right IJ port-a-cath, removal of gastrostomy tube, insertion of gastrostomy tube (bedside)  Surgeon: Feliciana Rossetti, M.D.  Asst: none  Anesthesia: gen   Indications for procedure: 19 yo female with mitochondrial disease and Chiari malformation. Her port-a-cath stopped working and she presents from replacement.  Description of procedure: The patient was brought into the operative suite, anesthesia was administered with LMA, both arms were tucked with offloading foam over all pressure points. The patient was prepped and draped in the usual sterile fashion. Next WHO checklist was completed.   Lidocaine was injected at the right anterior axillary line at the lower chest and incision was made. Cautery was used to dissect through the subcutaneous tissues and the port was isolated from surrounding tissues and removed.  Next, the [place] was used to assess anatomy and the RIJ was large and lay anterior the the common carotid artery A 18ga needle was used to gain access to the RIJ using ultrasound guidance. Nonpulsatile flow was seen and the J-wire was advanced without tension. XR was used to confirm placement within the venous system. No arrthymias were seen, the wire was clamped in place. The percutaneous access site was widened with a 11 blade. Local anesthesia was used to anesthetize the space inferior to the right clavicle. Next a 3cm incision was made 5 cm inferior to the clavicle. Cautery was used to create a pocket along the fascia inferior to incision. The tunneling device was used to make a wide turn from the pocket up to the perc access site. The introducer and sheath were then thread over the J wire in seldinger technique and wire was removed. Next the catheter was thread from the incision to the  access site and inserted into the sheath after the introducer was removed. Sheath was then pulled and removed. XR showed the tip of catheter to be at the atrial-caval junction. The port was then attached to the catheter cutting the catheter at appropriate length. The port was then placed in pocket and sewed to the fascia in two places with a 2-0 prolene. The port was accessed with huber needel and flushed and drew easily and the port was flushed with concentrated heparin. The incision was closed with 3-0 vicryl followed with 4-0 monocryl in running subcu fashion. A single 4-0 was used to close the access site. Dermabond was placed for dressing. The port was accessed with huber needle and needle left in place with dressing over top.   Next, the gastrostomy tube area was exposed. The saline was removed. The gastrostomy tube was easily removed. New gastrostomy tube was inserted and 4 ml saline injected into the balloon. The gastrostomy tube sat in neutral position at appropriate depth. Patient was extubated and brought to pacu in stable condition.  Findings: patent RIJ, catheter tip at the SVC atrial junction  Specimen: none  Blood loss: 10cc  Local anesthesia: 20cc 0.25% marcaine w epi  Complications: none  Feliciana Rossetti, M.D. General, Bariatric, & Minimally Invasive Surgery [facility]

## 2019-03-24 NOTE — Anesthesia Procedure Notes (Signed)
Procedure Name: LMA Insertion Date/Time: 03/24/2019 4:05 PM Performed by: Colon Flattery, CRNA Pre-anesthesia Checklist: Patient identified, Emergency Drugs available, Suction available and Patient being monitored Patient Re-evaluated:Patient Re-evaluated prior to induction Oxygen Delivery Method: Circle system utilized Preoxygenation: Pre-oxygenation with 100% oxygen Induction Type: IV induction Ventilation: Mask ventilation without difficulty LMA: LMA inserted LMA Size: 4.0 Placement Confirmation: positive ETCO2 and breath sounds checked- equal and bilateral Dental Injury: Teeth and Oropharynx as per pre-operative assessment

## 2019-03-24 NOTE — H&P (Signed)
Laura Parrish is an 19 y.o. female.   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 She had malfunction of the port 3 weeks ago and presents for replacement.  Past Medical History:  Diagnosis Date  . Allergy to alpha-gal    elevated IgE 04/10/16  . Autonomic dysfunction   . Chiari I malformation (HCC)   . Complication of anesthesia    slow to wake up  . Dyspnea   . Eczema   . GERD (gastroesophageal reflux disease)   . Headache   . Heart murmur   . Hypoglycemia   . Hypothyroid   . IBS (irritable bowel syndrome)   . Joint pain   . Mitochondrial disease (HCC)   . Muscle pain   . Pneumonia    several times  . PONV (postoperative nausea and vomiting)     Past Surgical History:  Procedure Laterality Date  . ADENOIDECTOMY     2004or 2005  . chiari decompression  2007  . GALLBLADDER SURGERY  2015  . GASTROSTOMY TUBE PLACEMENT     x 2  . MUSCLE BIOPSY  2010  . Port Replacement  05/2018   x 2 orignal 08/2016 then replacement 05/2018  . tethered cord  07/2006  . TONSILLECTOMY     [id number]    Family History  Problem Relation Age of Onset  . Asthma Mother   . Allergic rhinitis Mother   . Heart murmur Mother   . Allergic rhinitis Sister   . Allergic rhinitis Brother   . Asthma Brother   . Seizures Brother   . Mitochondrial disorder Brother   . Allergic rhinitis Maternal Grandmother   . Asthma Maternal Grandmother   . Diabetes type II Maternal Grandmother   . Lung cancer Maternal Grandfather   . Angioedema Neg Hx   . Eczema Neg Hx   . Immunodeficiency Neg Hx   . Urticaria Neg Hx    Social History:  reports that she has never smoked. She has never used smokeless tobacco. She reports that she does not drink alcohol or use drugs.  Allergies:  Allergies  Allergen Reactions  . Other Anaphylaxis    Alpha Gal allergy \ Red meat\ any hoof animal  . Latex Rash  . Lactated Ringers     Contraindicated w/  mitochondrial syndrome  . Propofol Other (See Comments)    Due to possible mitochondrial disorder Due to possible mitochondrial disorder Due to possible mitochondrial disorder  . Tape Itching and Other (See Comments)    Medications Prior to Admission  Medication Sig Dispense Refill  . ascorbic acid (VITAMIN C) 500 MG tablet Take 500 mg by mouth daily.     Marland Kitchen azelastine (ASTELIN) 0.1 % nasal spray Place 2 sprays into both nostrils 2 (two) times daily. Use in each nostril as directed 30 mL 5  . Coenzyme Q-10 200 MG CAPS Take 1 capsule by mouth daily.     Marland Kitchen EPINEPHrine 0.3 mg/0.3 mL IJ SOAJ injection Inject 0.3 mg into the muscle once. As needed    . fludrocortisone (FLORINEF) 0.1 MG tablet Take one tablet each morning. (Patient taking differently: Take 0.1 mg by mouth daily. ) 30 tablet 11  . lansoprazole (PREVACID) 30 MG capsule TAKE 1 CAPSULE BY MOUTH DAILY (Patient taking differently: Take 30 mg by mouth daily. ) 90 capsule 0  . leucovorin (WELLCOVORIN) 25 MG tablet Take 25 mg by mouth 2 (two) times daily.     Marland Kitchen  levOCARNitine (CARNITOR) 1 GM/10ML solution TAKE 2 TEASPOONFUL (10ML) BY MOUTH TWICEDAILY (Patient taking differently: Place 1,000 mg into feeding tube daily. ) 118 mL 12  . levocetirizine (XYZAL) 5 MG tablet TAKE 1 TABLET BY MOUTH EVERY EVENING (Patient taking differently: Take 5 mg by mouth every evening. ) 30 tablet 1  . levonorgestrel-ethinyl estradiol (SEASONALE) 0.15-0.03 MG tablet TAKE 1 TABLET BY MOUTH DAILY (Patient taking differently: Take 1 tablet by mouth daily. ) 1 Package 3  . metaxalone (SKELAXIN) 400 MG tablet Take 1 tablet (400 mg total) by mouth daily. (Patient taking differently: Take 400 mg by mouth daily as needed (Muslce pain). ) 30 tablet 6  . Nutritional Supplements (ENSURE CLEAR) LIQD 474 mLs by Gastrostomy Tube route daily. (Patient taking differently: 474 mLs by Gastrostomy Tube route 4 (four) times daily. During the day  By mouth or G tube) 60 Bottle 5  .  Nutritional Supplements (FEEDING SUPPLEMENT, VITAL 1.5 CAL,) LIQD Place 1,000 mLs into feeding tube continuous. bedtime    . ondansetron (ZOFRAN-ODT) 4 MG disintegrating tablet Take 1 tablet under the tongue every 6-8 hours as needed for nausea. (Patient taking differently: Take 4 mg by mouth every 6 (six) hours as needed for nausea or vomiting. 6-8 hours as needed for nausea.) 30 tablet 5  . pregabalin (LYRICA) 75 MG capsule TAKE 1 CAPSULE BY MOUTH IN THE MORNING AND 2 CAPSULES IN THE EVENING FOR LEG PAIN AND HEADACHE (Patient taking differently: Take 75-150 mg by mouth See admin instructions. TAKE 75 mg in the morning and 150 mg in the evening  FOR LEG PAIN AND HEADACHE) 90 capsule 5  . PRESCRIPTION MEDICATION Apply 1 application topically as needed (port). Elma- cream    . promethazine (PHENERGAN) 25 MG tablet Take 1 tablet at onset of migraine. May repeat every 6 hours PRN (Patient taking differently: Take 25 mg by mouth every 6 (six) hours as needed for nausea (migraine.). ) 30 tablet 0  . pyridostigmine (MESTINON) 60 MG tablet Take 1 tablet (60 mg total) by mouth 3 (three) times daily. 30 tablet 3  . rizatriptan (MAXALT) 10 MG tablet TAKE 1 TABLET BY MOUTH AS NEEDED AS DIRECTED (Patient taking differently: Take 10 mg by mouth as needed for migraine. ) 12 tablet 3  . SYNTHROID 88 MCG tablet TAKE 1 TABLET BY MOUTH EVERY MORNING ON AN EMPTY STOMACH (Patient taking differently: Take 88 mcg by mouth daily. ) 90 tablet 1  . TROKENDI XR 50 MG CP24 TAKE 1 CAPSULE BY MOUTH DAILY (Patient taking differently: Take 50 mg by mouth daily. ) 30 capsule 3  . albuterol (PROVENTIL HFA) 108 (90 Base) MCG/ACT inhaler Inhale 2 puffs into the lungs every 4 (four) hours as needed for wheezing or shortness of breath. 6.7 g 2  . clobetasol (OLUX) 0.05 % topical foam Apply topically 2 (two) times daily. Apply to affected areas twice daily as needed taking care to avoid axillae and groin area. (Patient taking differently:  Apply 1 application topically 2 (two) times daily. Apply to affected areas twice daily as needed taking care to avoid axillae and groin area.) 45 g 3  . cromolyn (GASTROCROM) 100 MG/5ML solution Take 5 mLs (100 mg total) by mouth 4 (four) times daily -  before meals and at bedtime. (Patient taking differently: Take 100 mg by mouth 4 (four) times daily. ) 600 mL 12  . lidocaine (XYLOCAINE) 5 % ointment Apply 1 application topically as needed. (Patient not taking: Reported on 03/18/2019) 35.44  g 3    Results for orders placed or performed during the hospital encounter of 03/24/19 (from the past 48 hour(s))  Glucose, capillary     Status: None   Collection Time: 03/24/19  1:04 PM  Result Value Ref Range   Glucose-Capillary 86 70 - 99 mg/dL    Comment: Glucose reference range applies only to samples taken after fasting for at least 8 hours.  CBC     Status: Abnormal   Collection Time: 03/24/19  1:32 PM  Result Value Ref Range   WBC 9.8 4.0 - 10.5 K/uL   RBC 5.70 (H) 3.87 - 5.11 MIL/uL   Hemoglobin 16.6 (H) 12.0 - 15.0 g/dL   HCT 40.9 (H) 81.1 - 91.4 %   MCV 87.9 80.0 - 100.0 fL   MCH 29.1 26.0 - 34.0 pg   MCHC 33.1 30.0 - 36.0 g/dL   RDW 78.2 95.6 - 21.3 %   Platelets 372 150 - 400 K/uL   nRBC 0.0 0.0 - 0.2 %    Comment: Performed at [facility], [address], [place], [place] [address]  Basic metabolic panel     Status: Abnormal   Collection Time: 03/24/19  1:32 PM  Result Value Ref Range   Sodium 141 135 - 145 mmol/L   Potassium 5.2 (H) 3.5 - 5.1 mmol/L   Chloride 109 98 - 111 mmol/L   CO2 18 (L) 22 - 32 mmol/L   Glucose, Bld 81 70 - 99 mg/dL    Comment: Glucose reference range applies only to samples taken after fasting for at least 8 hours.   BUN 13 6 - 20 mg/dL   Creatinine, Ser 8.46 0.44 - 1.00 mg/dL   Calcium 9.9 8.9 - 96.2 mg/dL   GFR calc non Af Amer >60 >60 mL/min   GFR calc Af Amer >60 >60 mL/min   Anion gap 14 5 - 15    Comment: Performed at [facility], [address] [place], [place] [address]  Glucose, capillary     Status: None   Collection Time: 03/24/19  2:54 PM  Result Value Ref Range   Glucose-Capillary 90 70 - 99 mg/dL    Comment: Glucose reference range applies only to samples taken after fasting for at least 8 hours.   No results found.  Review of Systems  Constitutional: Negative for chills and fever.  HENT: Negative for hearing loss.   Respiratory: Negative for cough.   Cardiovascular: Negative for chest pain and palpitations.  Gastrointestinal: Negative for abdominal pain, nausea and vomiting.  Genitourinary: Negative for dysuria and urgency.  Musculoskeletal: Negative for myalgias and neck pain.  Skin: Negative for rash.  Neurological: Negative for dizziness and headaches.  Hematological: Does not bruise/bleed easily.  Psychiatric/Behavioral: Negative for suicidal ideas.    Blood pressure 122/73, pulse 82, temperature 98.6 F (37 C), height 5\' 4"  (1.626 m), weight 54.4 kg, SpO2 97 %. Physical Exam  Nursing note and vitals reviewed. Constitutional: She is oriented to person, place, and time. She appears well-developed and well-nourished.  HENT:  Head: Normocephalic and atraumatic.  Eyes: Conjunctivae and EOM are normal. No scleral icterus.  Cardiovascular: Normal rate and regular rhythm.  Respiratory: Effort normal and breath sounds normal. She has no wheezes. She has no rales. She exhibits no tenderness.  GI: Soft. She exhibits no distension. There is no abdominal tenderness. There is no rebound.  Musculoskeletal:        General: No edema. Normal  range of motion.     Cervical back: Normal range of motion and neck supple.  Neurological: She is alert and oriented to person, place, and time.  Skin: Skin is warm and dry.  Right sided port in place  Psychiatric: She has a normal mood and affect. Her behavior is normal.     Assessment/Plan 19 yo female with multiple medical problems -port a cath removal and  replacement -gastrostomy tube exchange  Rodman Pickle, MD 03/24/2019, 3:44 PM

## 2019-03-24 NOTE — Discharge Instructions (Signed)
    PORT-A-CATH: POST OP INSTRUCTIONS  Always review your discharge instruction sheet given to you by the facility where your surgery was performed.   1. A prescription for pain medication may be given to you upon discharge. Take your pain medication as prescribed, if needed. If narcotic pain medicine is not needed, then you make take acetaminophen (Tylenol) or ibuprofen (Advil) as needed.  2. Take your usually prescribed medications unless otherwise directed. 3. If you need a refill on your pain medication, please contact our office. All narcotic pain medicine now requires a paper prescription.  Phoned in and fax refills are no longer allowed by law.  Prescriptions will not be filled after 5 pm or on weekends.  4. You should follow a light diet for the remainder of the day after your procedure. 5. Most patients will experience some mild swelling and/or bruising in the area of the incision. It may take several days to resolve. 6. It is common to experience some constipation if taking pain medication after surgery. Increasing fluid intake and taking a stool softener (such as Colace) will usually help or prevent this problem from occurring. A mild laxative (Milk of Magnesia or Miralax) should be taken according to package directions if there are no bowel movements after 48 hours.  7. Unless discharge instructions indicate otherwise, you may remove your bandages 48 hours after surgery, and you may shower at that time. You may have steri-strips (small white skin tapes) in place directly over the incision.  These strips should be left on the skin for 7-10 days.  If your surgeon used Dermabond (skin glue) on the incision, you may shower in 24 hours.  The glue will flake off over the next 2-3 weeks.  8. If your port is left accessed at the end of surgery (needle left in port), the dressing cannot get wet and should only by changed by a healthcare professional. When the port is no longer accessed (when the  needle has been removed), follow step 7.   9. ACTIVITIES:  Limit activity involving your arms for the next 72 hours. Do no strenuous exercise or activity for 1 week. You may drive when you are no longer taking prescription pain medication, you can comfortably wear a seatbelt, and you can maneuver your car. 10.You may need to see your doctor in the office for a follow-up appointment.  Please       check with your doctor.  11.When you receive a new Port-a-Cath, you will get a product guide and        ID card.  Please keep them in case you need them.  WHEN TO CALL YOUR DOCTOR ([phone]): 1. Fever over 101.0 2. Chills 3. Continued bleeding from incision 4. Increased redness and tenderness at the site 5. Shortness of breath, difficulty breathing   The clinic staff is available to answer your questions during regular business hours. Please don't hesitate to call and ask to speak to one of the nurses or medical assistants for clinical concerns. If you have a medical emergency, go to the nearest emergency room or call 911.  A surgeon from [facility] is always on call at the hospital.     For further information, please visit [email or web address]      

## 2019-03-24 NOTE — OR Nursing (Signed)
G-tube removed from LUQ at 1714 by Dr. Sheliah Hatch

## 2019-03-24 NOTE — Transfer of Care (Signed)
Immediate Anesthesia Transfer of Care Note  Patient: Laura Parrish  Procedure(s) Performed: PORT-A-CATH REMOVAL (N/A Abdomen) PORT-A-CATH INSERTION under ultrasound and fluoroscopic guidance (Right Neck) Exchange of Gastrostomy Tube (N/A Abdomen)  Patient Location: PACU  Anesthesia Type:General  Level of Consciousness: awake, alert  and oriented  Airway & Oxygen Therapy: Patient Spontanous Breathing  Post-op Assessment: Report given to RN, Post -op Vital signs reviewed and stable and Patient moving all extremities  Post vital signs: Reviewed and stable  Last Vitals:  Vitals Value Taken Time  BP 126/74 03/24/19 1730  Temp    Pulse 108 03/24/19 1736  Resp 15 03/24/19 1736  SpO2 99 % 03/24/19 1736  Vitals shown include unvalidated device data.  Last Pain:  Vitals:   03/24/19 1355  PainSc: 0-No pain         Complications: No apparent anesthesia complications

## 2019-03-24 NOTE — Anesthesia Postprocedure Evaluation (Signed)
Anesthesia Post Note  Patient: Laura Parrish  Procedure(s) Performed: PORT-A-CATH REMOVAL (N/A Abdomen) PORT-A-CATH INSERTION under ultrasound and fluoroscopic guidance (Right Neck) Exchange of Gastrostomy Tube (N/A Abdomen)     Patient location during evaluation: PACU Anesthesia Type: General Level of consciousness: sedated Pain management: pain level controlled Vital Signs Assessment: post-procedure vital signs reviewed and stable Respiratory status: spontaneous breathing and respiratory function stable Cardiovascular status: stable Postop Assessment: no apparent nausea or vomiting Anesthetic complications: no    Last Vitals:  Vitals:   03/24/19 1745 03/24/19 1758  BP: 124/69 124/63  Pulse: (!) 103 96  Resp: 18 20  Temp:  36.6 C  SpO2: 98% 98%    Last Pain:  Vitals:   03/24/19 1758  PainSc: 0-No pain                 Ader Fritze DANIEL

## 2019-03-24 NOTE — OR Nursing (Signed)
Port-a-cath removed from RUQ at 1630 by Dr. Sheliah Hatch

## 2019-03-25 ENCOUNTER — Encounter: Payer: Self-pay | Admitting: *Deleted

## 2019-03-25 ENCOUNTER — Other Ambulatory Visit (INDEPENDENT_AMBULATORY_CARE_PROVIDER_SITE_OTHER): Payer: Self-pay | Admitting: Family

## 2019-03-25 DIAGNOSIS — E8840 Mitochondrial metabolism disorder, unspecified: Secondary | ICD-10-CM

## 2019-03-25 DIAGNOSIS — Z9189 Other specified personal risk factors, not elsewhere classified: Secondary | ICD-10-CM

## 2019-03-25 DIAGNOSIS — Z95828 Presence of other vascular implants and grafts: Secondary | ICD-10-CM

## 2019-03-25 DIAGNOSIS — R42 Dizziness and giddiness: Secondary | ICD-10-CM

## 2019-03-25 DIAGNOSIS — R638 Other symptoms and signs concerning food and fluid intake: Secondary | ICD-10-CM

## 2019-03-25 NOTE — Progress Notes (Signed)
POCT urine pregnancy ordered yesterday 03/24/2019 per protocol.

## 2019-03-28 ENCOUNTER — Other Ambulatory Visit (INDEPENDENT_AMBULATORY_CARE_PROVIDER_SITE_OTHER): Payer: Self-pay | Admitting: Family

## 2019-03-28 DIAGNOSIS — E8840 Mitochondrial metabolism disorder, unspecified: Secondary | ICD-10-CM

## 2019-03-28 DIAGNOSIS — R531 Weakness: Secondary | ICD-10-CM

## 2019-03-28 DIAGNOSIS — Z95828 Presence of other vascular implants and grafts: Secondary | ICD-10-CM

## 2019-03-28 DIAGNOSIS — R42 Dizziness and giddiness: Secondary | ICD-10-CM

## 2019-03-28 DIAGNOSIS — Z9189 Other specified personal risk factors, not elsewhere classified: Secondary | ICD-10-CM

## 2019-03-28 DIAGNOSIS — R638 Other symptoms and signs concerning food and fluid intake: Secondary | ICD-10-CM

## 2019-03-28 LAB — SURGICAL PATHOLOGY

## 2019-03-28 NOTE — Progress Notes (Signed)
Maintenance fluids orders faxed to [facility]

## 2019-04-18 ENCOUNTER — Encounter (INDEPENDENT_AMBULATORY_CARE_PROVIDER_SITE_OTHER): Payer: Self-pay | Admitting: Pediatrics

## 2019-04-18 MED ORDER — PYRIDOSTIGMINE BROMIDE 60 MG PO TABS: 120.0000 mg | ORAL_TABLET | Freq: Three times a day (TID) | ORAL | 3 refills | Status: DC

## 2019-04-21 ENCOUNTER — Other Ambulatory Visit (INDEPENDENT_AMBULATORY_CARE_PROVIDER_SITE_OTHER): Payer: Self-pay | Admitting: "Endocrinology

## 2019-04-21 DIAGNOSIS — E063 Autoimmune thyroiditis: Secondary | ICD-10-CM

## 2019-04-23 ENCOUNTER — Other Ambulatory Visit (INDEPENDENT_AMBULATORY_CARE_PROVIDER_SITE_OTHER): Payer: Self-pay | Admitting: Pediatrics

## 2019-04-23 DIAGNOSIS — G43009 Migraine without aura, not intractable, without status migrainosus: Secondary | ICD-10-CM

## 2019-05-02 ENCOUNTER — Other Ambulatory Visit (INDEPENDENT_AMBULATORY_CARE_PROVIDER_SITE_OTHER): Payer: Self-pay | Admitting: Pediatrics

## 2019-05-11 ENCOUNTER — Encounter: Payer: Self-pay | Admitting: Orthopaedic Surgery

## 2019-05-11 ENCOUNTER — Ambulatory Visit: Payer: Self-pay

## 2019-05-11 ENCOUNTER — Ambulatory Visit (INDEPENDENT_AMBULATORY_CARE_PROVIDER_SITE_OTHER): Payer: Commercial Managed Care - PPO | Admitting: Orthopaedic Surgery

## 2019-05-11 ENCOUNTER — Other Ambulatory Visit: Payer: Self-pay

## 2019-05-11 DIAGNOSIS — S43004A Unspecified dislocation of right shoulder joint, initial encounter: Secondary | ICD-10-CM

## 2019-05-11 DIAGNOSIS — S43005A Unspecified dislocation of left shoulder joint, initial encounter: Secondary | ICD-10-CM

## 2019-05-11 NOTE — Progress Notes (Signed)
Office Visit Note   Patient: Laura Parrish           Date of Birth: [birth year]           MRN: [id number] Visit Date: 05/11/2019              Requested by: [name], MD [address] [place],  [place] [address] PCP: [name], MD   Assessment & Plan: Visit Diagnoses:  1. Dislocation of left shoulder joint, initial encounter   2. Shoulder dislocation, right, initial encounter     Plan: Impression is multiple bilateral shoulder subluxations with underlying Ehlers-Danlos.  We will start the patient in formal physical therapy to help with strengthening of the shoulders.  She will follow-up with [place] as needed.  Call with concerns or questions in meantime.  Follow-Up Instructions: Return if symptoms worsen or fail to improve.   Orders:  Orders Placed This Encounter  Procedures  . XR Shoulder Right  . XR Shoulder Left  . Ambulatory referral to Physical Therapy   No orders of the defined types were placed in this encounter.     Procedures: No procedures performed   Clinical Data: No additional findings.   Subjective: Chief Complaint  Patient presents with  . Right Shoulder - Pain  . Left Shoulder - Pain    HPI patient is a pleasant 19 year old female who comes in today with her mom.  She does have a multitude of medical problems to include Ehlers-Danlos syndrome.  She is here today for evaluation and treatment recommendation of bilateral shoulder pain and multiple subluxations.  She notes that this has been ongoing for the past 6 years with the left shoulder being worse than the right.  The first time her left shoulder dislocated/subluxed was while doing pool therapy without a specific injury.  She notes that her shoulders primarily the left sublux a couple times a month.  This usually occurs while she is sleeping.  She will then have pain to the anterior aspect of the shoulder for a few days.  Review of Systems as detailed in HPI.  All other reviewed  and are negative.   Objective: Vital Signs: There were no vitals taken for this visit.  Physical Exam well-developed well-nourished female no acute distress.  Alert and oriented x3.  Ortho Exam examination of both shoulders reveals near full active range of motion in all planes.  4 and half out of 5 strength throughout.  Positive sulcus sign.  No apprehension.  She is neurovascular intact distally.  Specialty Comments:  No specialty comments available.  Imaging: XR Shoulder Left  Result Date: 05/11/2019 No acute or structural abnormalities  XR Shoulder Right  Result Date: 05/11/2019 No acute or structural abnormalities    PMFS History: Patient Active Problem List   Diagnosis Date Noted  . Bradycardia 02/06/2018  . Thyroiditis, autoimmune 02/06/2018  . Goiter 02/06/2018  . Dizziness and giddiness 02/06/2018  . Weakness 02/06/2018  . Sensation of feeling cold 02/06/2018  . Hypoglycemia 02/06/2018  . Hyperprolactinemia (HCC) 02/06/2018  . Hyperhidrosis of palms and soles 02/06/2018  . Dehydration 02/06/2018  . At risk for dehydration 10/12/2017  . Breakthrough bleeding on birth control pills 06/25/2017  . Muscle pain, cervical 06/25/2017  . Inadequate fluid intake 06/25/2017  . Snoring 09/28/2016  . Port-A-Cath in place 09/22/2016  . Iron deficiency anemia 07/23/2016  . Health care home, active care coordination 07/23/2016  . Complex care coordination 07/23/2016  . Latex  allergy 06/18/2016  . Chiari I malformation ([place]) 05/22/2016  . Chronic pain 05/22/2016  . 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 Paresthesia 05/22/2016  . Premature birth 05/22/2016  . 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 Allergy to insect bites and stings 04/10/2016  . Atopic dermatitis 04/10/2016  . Contact dermatitis due to chemicals 04/10/2016  . Chronic rhinitis 04/10/2016  . 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 Sensorineural hearing loss (SNHL) of both ears  08/20/2015  . Chronic fatigue 02/15/2015  . Diarrhea, unspecified 04/19/2014  . Abdominal wall pain in periumbilical region 23/53/6144  . 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 Hypothyroidism (acquired) 04/20/2013  . Lactose intolerance 01/27/2013  . 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 Migraine without aura and without status migrainosus, not intractable 10/01/2012  . Mitochondrial disease ([place]) 03/15/2012  . Irregular menstrual cycle 09/29/2011  . 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 Constipation 01/24/2009   Past Medical History:  Diagnosis Date  . Allergy to alpha-gal    elevated IgE 04/10/16  . Autonomic dysfunction   . Chiari I malformation ([place])   . Complication of anesthesia    slow to wake up  . Dyspnea   . Eczema   . GERD (gastroesophageal reflux disease)   . Headache   . Heart murmur   . Hypoglycemia   . Hypothyroid   . IBS (irritable bowel syndrome)   . Joint pain   . Mitochondrial disease ([place])   . Muscle pain   . Pneumonia    several times  . PONV (postoperative nausea and vomiting)     Family History  Problem Relation Age of Onset  . Asthma Mother   . Allergic rhinitis Mother   . Heart murmur Mother   . Allergic rhinitis Sister   . Allergic rhinitis Brother   . Asthma Brother   . Seizures Brother   . Mitochondrial disorder Brother   . Allergic rhinitis Maternal Grandmother   . Asthma Maternal Grandmother   . Diabetes type II Maternal Grandmother   . Lung cancer Maternal Grandfather   . Angioedema Neg Hx   . Eczema Neg Hx   . Immunodeficiency Neg Hx   . Urticaria Neg Hx     Past Surgical History:  Procedure Laterality Date  . ADENOIDECTOMY     2004or 2005  . chiari decompression  2007  . GALLBLADDER SURGERY  2015  . GASTROSTOMY TUBE PLACEMENT     x 2  . MUSCLE BIOPSY  2010  . Port Replacement  05/2018   x 2 orignal 08/2016 then replacement 05/2018  . PORT-A-CATH REMOVAL N/A 03/24/2019   Procedure: PORT-A-CATH REMOVAL;   Surgeon: Kieth Brightly Arta Bruce, MD;  Location: [facility];  Service: General;  Laterality: N/A;  . PORTACATH PLACEMENT Right 03/24/2019   Procedure: PORT-A-CATH INSERTION under ultrasound and fluoroscopic guidance;  Surgeon: Kieth Brightly Arta Bruce, MD;  Location: [facility];  Service: General;  Laterality: Right;  . REMOVAL OF GASTROSTOMY TUBE N/A 03/24/2019   Procedure: Exchange of Gastrostomy Tube;  Surgeon: Mickeal Skinner, MD;  Location: [facility];  Service: General;  Laterality: N/A;  . tethered cord  07/2006  . TONSILLECTOMY     [id number]   Social History   Occupational History  . Not on file  Tobacco Use  . Smoking status: Never Smoker  . Smokeless tobacco: Never Used  Substance and Sexual Activity  . Alcohol use: No  . Drug use: No  . Sexual activity: Never    Birth  control/protection: None

## 2019-06-08 ENCOUNTER — Telehealth (INDEPENDENT_AMBULATORY_CARE_PROVIDER_SITE_OTHER): Payer: Self-pay | Admitting: Family

## 2019-06-08 NOTE — Telephone Encounter (Signed)
Mom contacted me with concerns about Laura Parrish. She said that for the past week or so, Laura Parrish has had problems with eyelid drooping and difficulty focusing her eyes. She also reports a feeling of tightness around her throat and that she has difficulty taking a deep breath. Mom said that Laura Parrish has not been able to tolerate Mestinon 2 tablets TID but has been taking 1+1/2 tablets in the morning and in the mid afternoon. She says that a late day dose keeps her awake at night. I talked with Dr Artis Flock, who recommended increasing the Mestinon doses to 2 tablets morning and afternoon. She also asked about the EMG ordered in November. I contacted Mom and she said that she has not received a call to schedule it. Mom would like the study at [facility] because she wants to get established with the muscular dystrophy clinic there at some point. I told Mom that I will work on getting the NCV/EMG scheduled. TG

## 2019-06-16 NOTE — Progress Notes (Addendum)
Pediatric Pulmonology  Clinic Note  06/17/2019  Primary Care Physician: Laura Likens, MD  Assessment and Plan:  Laura Parrish is a 19 y.o. female who was seen today for the following issues:  Dyspnea: Laura Parrish presents today for followup of dyspnea. Though the etiology of her dyspnea is unclear, though this could be potentially related to muscle weakness given concern for possible mitochondrial or other neurologic disorder. She is being worked up for possible myasthenia gravis - and is getting EMG's next week. She has seemed to have improvement in her respiratory symptoms with Mestinon. Her symptoms seem to be most related to generalized weakness, without any significant lung pathology shown on her workup so far. Her polysomnography was reassuring with no evidence of hypoventilation. I therefore would recommend continuing her current treatment and workup with Dr. Artis Parrish. I don't feel that she needs further workup or treatment from a respiratory standpoint right now, but would be happy to see her again in the future if symptoms worsen or new symptoms arise.   Plan: - Continue workup and treatment for possible myasthenia per Dr. Artis Parrish   Healthcare Maintenance: - Laura Parrish should receive a flu vaccine next season when it is available.  - Laura Parrish has received 2 covid vaccine  Followup: Return if symptoms worsen or fail to improve.     Laura Noa "Will" Damita Lack, MD [facility] Pediatric Pulmonology [place] Office: [phone] [facility] Office [phone]   Subjective:  Laura Parrish is a 19 y.o. female who is seen for followup of dyspnea.   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 She is followed by Dr. Artis Parrish and Laura Parrish with the [facility]. 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    Laura Parrish was last seen by myself in clinic in November 2020. At that time, she continued  to have dyspnea. We planned for a sleep study, EMG with Dr. Artis Parrish, and MIP/MEP's done at [facility].  Laura Parrish saw Dr. Artis Parrish last in February 2021- and she did report some improvement in her symptoms with Mestinon at that time. She had full PFT's done at [facility] in January - which showed normal spirometry and lung volume, though low but improved MIP/MEP's - thought to be related to general fatigue. She had a sleep study done at [facility] in January that showed no sleep-disordered breathing or hypoventilation.   Today, Laura Parrish reports that she has been doing a little better with her breathing. She has been taking Mestinon 3x a day - and for 2-3 hours afterwards it seems to be easier for her to take a deep breath. She didn't notice much a change with the higher dose. Only side effect is mild GI upset. She continues to have some shortness of breath with exertion. Overall she is sleeping pretty well. No new respiratory symptoms.   She is transferring to [facility] in the fall- planning on studying history.    Past Medical History:   Patient Active Problem List   Diagnosis Date Noted  . Bradycardia 02/06/2018  . Thyroiditis, autoimmune 02/06/2018  . Goiter 02/06/2018  . Dizziness and giddiness 02/06/2018  . Weakness 02/06/2018  . Sensation of feeling cold 02/06/2018  . Hypoglycemia 02/06/2018  . Hyperprolactinemia (HCC) 02/06/2018  . Hyperhidrosis of palms and soles 02/06/2018  . Dehydration 02/06/2018  . At risk for dehydration 10/12/2017  . Breakthrough bleeding on birth control pills 06/25/2017  . Muscle pain, cervical 06/25/2017  . Inadequate fluid intake 06/25/2017  .  Snoring 09/28/2016  . Port-A-Cath in place 09/22/2016  . Iron deficiency anemia 07/23/2016  . Health care home, active care coordination 07/23/2016  . Complex care coordination 07/23/2016  . Latex allergy 06/18/2016  . 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 Chronic pain 05/22/2016  . 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  Paresthesia 05/22/2016  . Premature birth 05/22/2016  . 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 Allergy to insect bites and stings 04/10/2016  . Atopic dermatitis 04/10/2016  . Contact dermatitis due to chemicals 04/10/2016  . Chronic rhinitis 04/10/2016  . 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 Chronic fatigue 02/15/2015  . Diarrhea, unspecified 04/19/2014  . Abdominal wall pain in periumbilical region 04/19/2014  . 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 Hypothyroidism (acquired) 04/20/2013  . Lactose intolerance 01/27/2013  . 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 Migraine without aura and without status migrainosus, not intractable 10/01/2012  . Mitochondrial disease (HCC) 03/15/2012  . Irregular menstrual cycle 09/29/2011  . 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 Constipation 01/24/2009    Birth History: Born at 34 weeks. 1 night in NICU. Small amt of oxygen right after birth.  Hospitalizations: None   Medications:   Current Outpatient Medications:  .  albuterol (PROVENTIL HFA) 108 (90 Base) MCG/ACT inhaler, Inhale 2 puffs into the lungs every 4 (four) hours as needed for wheezing or shortness of breath., Disp: 6.7 g, Rfl: 2 .  ascorbic acid (VITAMIN C) 500 MG tablet, Take 500 mg by mouth daily. , Disp: , Rfl:  .  azelastine (ASTELIN) 0.1 % nasal spray, Place 2 sprays into both nostrils 2 (two) times daily. Use in each nostril as directed, Disp: 30 mL, Rfl: 5 .  clobetasol (OLUX) 0.05 % topical foam, Apply topically 2 (two) times daily. Apply to affected areas twice daily as needed taking care to avoid axillae and groin area. (Patient taking differently: Apply 1 application topically 2 (two) times daily. Apply to affected areas twice daily as needed taking care to avoid axillae and groin area.), Disp: 45 g, Rfl: 3 .  Coenzyme Q-10 200 MG CAPS, Take 1 capsule by mouth daily.  , Disp: , Rfl:  .  cromolyn (GASTROCROM) 100 MG/5ML solution, Take 5 mLs (100 mg total) by mouth 4 (four) times daily -  before meals and at bedtime. (Patient taking differently: Take 100 mg by mouth 4 (four) times daily. ), Disp: 600 mL, Rfl: 12 .  EPINEPHrine 0.3 mg/0.3 mL IJ SOAJ injection, Inject 0.3 mg into the muscle once. As needed, Disp: , Rfl:  .  fludrocortisone (FLORINEF) 0.1 MG tablet, Take one tablet each morning. (Patient taking differently: Take 0.1 mg by mouth daily. ), Disp: 30 tablet, Rfl: 11 .  ibuprofen (ADVIL) 800 MG tablet, Take 1 tablet (800 mg total) by mouth every 8 (eight) hours as needed., Disp: 30 tablet, Rfl: 0 .  lansoprazole (PREVACID) 30 MG capsule, TAKE 1 CAPSULE BY MOUTH DAILY, Disp: 90 capsule, Rfl: 2 .  leucovorin (WELLCOVORIN) 25 MG tablet, Take 25 mg by mouth 2 (two) times daily. , Disp: , Rfl:  .  levOCARNitine (CARNITOR) 1 GM/10ML solution, TAKE 2 TEASPOONFUL ( ) BY MOUTH TWICEDAILY (Patient taking differently: Place 1,000 mg into feeding tube daily. ), Disp: 118 mL, Rfl: 12 .  levocetirizine (XYZAL) 5 MG tablet, TAKE 1 TABLET BY MOUTH EVERY EVENING (Patient taking differently: Take 5 mg by mouth every evening. ), Disp: 30  tablet, Rfl: 1 .  levonorgestrel-ethinyl estradiol (SEASONALE) 0.15-0.03 MG tablet, TAKE 1 TABLET BY MOUTH DAILY (Patient taking differently: Take 1 tablet by mouth daily. ), Disp: 1 Package, Rfl: 3 .  lidocaine (XYLOCAINE) 5 % ointment, Apply 1 application topically as needed. (Patient not taking: Reported on 03/18/2019), Disp: 35.44 g, Rfl: 3 .  metaxalone (SKELAXIN) 400 MG tablet, Take 1 tablet (400 mg total) by mouth daily. (Patient taking differently: Take 400 mg by mouth daily as needed (Muslce pain). ), Disp: 30 tablet, Rfl: 6 .  Nutritional Supplements (ENSURE CLEAR) LIQD, 474 mLs by Gastrostomy Tube route daily. (Patient taking differently: 474 mLs by Gastrostomy Tube route 4 (four) times daily. During the day  By mouth or G tube),  Disp: 60 Bottle, Rfl: 5 .  Nutritional Supplements (FEEDING SUPPLEMENT, VITAL 1.5 CAL,) LIQD, Place 1,000 mLs into feeding tube continuous. bedtime, Disp: , Rfl:  .  ondansetron (ZOFRAN-ODT) 4 MG disintegrating tablet, Take 1 tablet under the tongue every 6-8 hours as needed for nausea. (Patient taking differently: Take 4 mg by mouth every 6 (six) hours as needed for nausea or vomiting. 6-8 hours as needed for nausea.), Disp: 30 tablet, Rfl: 5 .  oxyCODONE (OXY IR/ROXICODONE) 5 MG immediate release tablet, Take 1 tablet (5 mg total) by mouth every 6 (six) hours as needed for severe pain., Disp: 10 tablet, Rfl: 0 .  pregabalin (LYRICA) 75 MG capsule, TAKE 1 CAPSULE BY MOUTH IN THE MORNING AND 2 CAPSULES IN THE EVENING FOR LEG PAIN AND HEADACHE (Patient taking differently: Take 75-150 mg by mouth See admin instructions. TAKE 75 mg in the morning and 150 mg in the evening  FOR LEG PAIN AND HEADACHE), Disp: 90 capsule, Rfl: 5 .  PRESCRIPTION MEDICATION, Apply 1 application topically as needed (port). Elma- cream, Disp: , Rfl:  .  promethazine (PHENERGAN) 25 MG tablet, Take 1 tablet at onset of migraine. May repeat every 6 hours PRN (Patient taking differently: Take 25 mg by mouth every 6 (six) hours as needed for nausea (migraine.). ), Disp: 30 tablet, Rfl: 0 .  pyridostigmine (MESTINON) 60 MG tablet, Take 2 tablets (120 mg total) by mouth 3 (three) times daily., Disp: 30 tablet, Rfl: 3 .  rizatriptan (MAXALT) 10 MG tablet, TAKE 1 TABLET BY MOUTH AS NEEDED AS DIRECTED (Patient taking differently: Take 10 mg by mouth as needed for migraine. ), Disp: 12 tablet, Rfl: 3 .  SYNTHROID 88 MCG tablet, TAKE 1 TABLET BY MOUTH EVERY MORNING ON AN EMPTY STOMACH, Disp: 90 tablet, Rfl: 1 .  Topiramate ER (TROKENDI XR) 50 MG CP24, Take 50 mg by mouth daily., Disp: 31 capsule, Rfl: 3  Allergies:   Allergies  Allergen Reactions  . Other Anaphylaxis    Alpha Gal allergy \ Red meat\ any hoof animal  . Latex Rash  .  Lactated Ringers     Contraindicated w/ mitochondrial syndrome  . Propofol Other (See Comments)    Due to possible mitochondrial disorder Due to possible mitochondrial disorder Due to possible mitochondrial disorder  . Tape Itching and Other (See Comments)    Social History:   Social History   Social History Narrative   Sharma is at [employer] for associate in arts to transfer to [employer].    She enjoys reading, playing with her cat and her dog, and hanging out with her sister.      Lives at home in [place] [address]. No tobacco smoke or vaping exposure.  cat  and dog[place]   Objective:  Vitals Signs: BP 130/78   Pulse 60   Ht 5\' 4"  (1.626 m)   Wt 117 lb (53.1 kg)   SpO2 100%   BMI 20.08 kg/m  Growth percentile SmartLinks can only be used for patients less than 29 years old. Wt Readings from Last 3 Encounters:  06/17/19 117 lb (53.1 kg)  03/24/19 120 lb (54.4 kg) (34 %, Z= -0.42)*  03/04/19 118 lb (53.5 kg) (30 %, Z= -0.52)*   * Growth percentiles are based on CDC (Girls, 2-20 Years) data.   Ht Readings from Last 3 Encounters:  06/17/19 5\' 4"  (1.626 m)  03/24/19 5\' 4"  (1.626 m) (45 %, Z= -0.12)*  02/10/19 5' 4.17" (1.63 m) (48 %, Z= -0.05)*   * Growth percentiles are based on CDC (Girls, 2-20 Years) data.   Physical Exam  Constitutional: She appears well-developed. No distress.  HENT:  Mouth/Throat: Oropharynx is clear and moist.  Cardiovascular: Normal rate and regular rhythm.  No murmur heard. Pulmonary/Chest: Effort normal and breath sounds normal. No tachypnea. No respiratory distress. She has no wheezes. She has no rales.  Abdominal: Soft. There is no hepatosplenomegaly. There is no abdominal tenderness.  Musculoskeletal:     Cervical back: Neck supple.  Lymphadenopathy:    She has no cervical adenopathy.  Neurological: She is alert.  Skin: Skin is warm. No rash noted.    Medical Decision Making:     In office  spirometry today showed: % pred FVC 93% FEV1 92% FEV1/FVC: 101% FEV25-75: 96% Interpretation: Suggestive of possible borderline mild restriction  polysomnography 01/20/19:  Impression 1. No evidence for clinically significant sleep disordered breathing.  Oxygen saturations and End tidal C02 were in normal range.  No evidence of hypoventilation.   Bicarb:  CO2  Date Value Ref Range Status  03/24/2019 18 (L) 22 - 32 mmol/L Final   Spirometry - normal spirometry lung volumes and DLCO - no bronchodilator response. MIP/MEP's low.

## 2019-06-17 ENCOUNTER — Encounter (INDEPENDENT_AMBULATORY_CARE_PROVIDER_SITE_OTHER): Payer: Self-pay | Admitting: Pediatrics

## 2019-06-17 ENCOUNTER — Other Ambulatory Visit: Payer: Self-pay

## 2019-06-17 ENCOUNTER — Ambulatory Visit (INDEPENDENT_AMBULATORY_CARE_PROVIDER_SITE_OTHER): Payer: Commercial Managed Care - PPO | Admitting: Pediatrics

## 2019-06-17 VITALS — BP 130/78 | HR 60 | Ht 64.0 in | Wt 117.0 lb

## 2019-06-17 DIAGNOSIS — R5382 Chronic fatigue, unspecified: Secondary | ICD-10-CM

## 2019-06-17 DIAGNOSIS — R0602 Shortness of breath: Secondary | ICD-10-CM

## 2019-06-17 DIAGNOSIS — R071 Chest pain on breathing: Secondary | ICD-10-CM

## 2019-06-17 NOTE — Patient Instructions (Addendum)
Pediatric Pulmonology  Clinic Discharge Instructions       06/17/19    It was great to see you today Laura Parrish! I am glad the Mestinon has seemed to help with your breathing symptoms. I recommend continuing that for now, and continuing to followup with Dr. Artis Flock for workup and treatment of that. I don't think you necessarily need to come back to see me on a regular basis - but if you have any questions, concerns, or new symptoms - please reach out.    Followup: Return if symptoms worsen or fail to improve.  Please call [phone] with any further questions or concerns.

## 2019-06-23 ENCOUNTER — Encounter: Payer: Self-pay | Admitting: Pediatrics

## 2019-07-01 ENCOUNTER — Other Ambulatory Visit (INDEPENDENT_AMBULATORY_CARE_PROVIDER_SITE_OTHER): Payer: Self-pay | Admitting: Family

## 2019-07-01 DIAGNOSIS — E8840 Mitochondrial metabolism disorder, unspecified: Secondary | ICD-10-CM

## 2019-07-01 DIAGNOSIS — R638 Other symptoms and signs concerning food and fluid intake: Secondary | ICD-10-CM

## 2019-07-01 DIAGNOSIS — Z9189 Other specified personal risk factors, not elsewhere classified: Secondary | ICD-10-CM

## 2019-07-01 DIAGNOSIS — Z95828 Presence of other vascular implants and grafts: Secondary | ICD-10-CM

## 2019-07-01 NOTE — Progress Notes (Signed)
For

## 2019-07-12 ENCOUNTER — Other Ambulatory Visit (INDEPENDENT_AMBULATORY_CARE_PROVIDER_SITE_OTHER): Payer: Self-pay | Admitting: "Endocrinology

## 2019-07-12 ENCOUNTER — Other Ambulatory Visit (INDEPENDENT_AMBULATORY_CARE_PROVIDER_SITE_OTHER): Payer: Self-pay | Admitting: Pediatrics

## 2019-07-12 DIAGNOSIS — E063 Autoimmune thyroiditis: Secondary | ICD-10-CM

## 2019-07-22 ENCOUNTER — Other Ambulatory Visit (INDEPENDENT_AMBULATORY_CARE_PROVIDER_SITE_OTHER): Payer: Self-pay | Admitting: Family

## 2019-07-22 DIAGNOSIS — E739 Lactose intolerance, unspecified: Secondary | ICD-10-CM

## 2019-07-22 DIAGNOSIS — E8840 Mitochondrial metabolism disorder, unspecified: Secondary | ICD-10-CM

## 2019-07-22 DIAGNOSIS — E039 Hypothyroidism, unspecified: Secondary | ICD-10-CM

## 2019-07-22 DIAGNOSIS — E063 Autoimmune thyroiditis: Secondary | ICD-10-CM

## 2019-07-22 DIAGNOSIS — R5382 Chronic fatigue, unspecified: Secondary | ICD-10-CM

## 2019-07-22 DIAGNOSIS — R202 Paresthesia of skin: Secondary | ICD-10-CM

## 2019-07-22 DIAGNOSIS — Z9189 Other specified personal risk factors, not elsewhere classified: Secondary | ICD-10-CM

## 2019-07-24 NOTE — Progress Notes (Signed)
Subjective:  Subjective  Patient Name: Laura Parrish Date of Birth: Dec 10, 2000  MRN: [id number]  Laura Parrish  presents for her clinic visit today for follow up evaluation and management of her acquired hypothyroidism due to Hashimoto's thyroiditis and many other health problems.   HISTORY OF PRESENT ILLNESS:   Laura Parrish is a 19 y.o. Caucasian young lady.   Laura Parrish was accompanied by her mother  1. Laura Parrish's initial pediatric endocrine clinic visit here at [facility] occurred on 01/29/18. The following information is a composite of the history gained at her initial visit, information gained after 7 hours of reviewing her records in [place], and information gleaned on 02/05/18:   A. Laura Parrish has had a very complicated medical and surgical history. She has been hospitalized many times during her life. Her problem list includes the following:    1). Chronic GI problems, with frequent constipation, frequent diarrhea, BMs occurring immediately after eating, nausea, abdominal pains, and feeding difficulties requiring the use of both oral and nocturnal G-tube feedings: Previous EGD and colonoscopy have been unremarkable. TTG IgA was negative on 12/29/17.    2). Chronic allergies, including seasonal allergies, allergies to other environmental agents, latex, Propofol, fish and shellfish according to rashes but not to testing, and Alpha-Gal allergy with a positive result, but not specifically beef, pork, or lamb;   3). Chronic fatigue   4). Iron deficiency anemia   5). Hypothyroidism with elevated TPO and thyroglobulin antibodies, c/w Hashimoto's thyroiditis;       6). Muscle weakness and myalgias   7). 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 Family was offered treatment with Florinef, but declined because their pharmacist advised against starting Florinef due to possible adverse effects.     8). Intermittent dehydration requiring  administration of iv fluids via her port-a-cath every 6 weeks, but more recently every 2 weeks   9). Chronic headaches, to include migraines             10). 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 Chronic swelling of her hands and feet   13). Chronic pain of her back, joints, legs, and feet   14). Skin rashes, hives, eczema, and flushing   15). Irregular menses and menorrhagia   16). Nocturnal hypoglycemia if she does not have nocturnal pump feedings.    17). Chronic illness with low grade fevers, sometimes up to 101, but mostly 99-100 degrees   18). 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 However, her testing did not support that hypothesis. Mom has the same genetic variant, but not all of the same problems.   B. Perinatal history: Gestational Age: [redacted]w[redacted]d; 5 lb 7 oz (2.466 kg); Healthy newborn, except noted to have a right club foot deformity.    C. Infancy: She had to be hospitalized several times for unexplained dehydration. She had club foot surgery at about 36 weeks of age.   D. Childhood:   1). She was sick a lot.    2). She had PT for her club foot.    3). A Chiari malformation was discovered at age 65. She had decompression surgery in 2007.  4). In 2008 she was discovered at [name] to have a tethered cord. She had a release procedure in 2008.   5). In 2010 she had a muscle BX that showed mild variation in fiber size, more notable in Type I fibers with rare atrophic Type I fibers. EM showed that the normal sarcomeric architecture was preserved. Mitochondria were morphologically unremarkable. Dr. 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    6). In 2010 she continued to have severe problems with lack of appetite, nausea, and feeding difficulties. In late 2010 she had a G-tube placed.    7). Genetic testing was performed at [facility] on 09/02/08. Karyotype and chromosomal array were normal. 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 Mitochondrial DNA copy number in muscle tissue was normal, 123% of the mean value of the age and tissue tested controls.   8). On 10/18/12 Laura Parrish's TFTs were c/w primary hypothyroidism. Levothyroxine treatment was initiated.    9). She had a cholecystectomy in June 2015 for biliary hypokinesia.    10). A port-a-cath was placed in August 2018.    11). In December 2018 she saw a geneticist, Dr. Penni Bombard, in [place] for evaluation of a mitochondrial dysfunction. 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 Targeted GeneDX testing was performed in June-July 2019 and reportedly showed allergies in both Laura Parrish and her brother. Testing in Gerlean's brother also showed genetic tendencies for seizures and developmental delays.     E. Chief complaint:   1). Hypothyroidism was diagnosed on 18 October 2012 after several years of variable thyroid hormone test results. Her TSH was high 10.35 (ref 0.5-4.5) and free T4 was low at 0.67 (ref 0.80-2.0). TPO antibody and thyroglobulin antibody were both reportedly positive. She started thyroid hormone at about that time. She takes generic levothyroxine, 50 mcg/day. That dose has not been changed since it was started. She does not think that she has ever had thyroid swelling or pain. Mom said that Laura Parrish's TSH and free T4 values have been normal. However, her TSH was elevated again in February 2019.   2). Hypoglycemia: At about age 19 she had low BGs almost all the time. If she did not get G-tube feedings at night, the BGs in the mornings  could be in the 40s-50s. She had never been evaluated for adrenal insufficiency. If her BGs are low during the day she can get dizzy.   3). Dizziness: She has been dizzy for many years. The dizziness was originally ascribed to her Arnold-Chiari malformation, but did not improve after surgery. About 30% of her dizziness was associated with low BGs. Some of her dizziness was a spinning dizziness.  Some of her dizziness appears to be orthostatic dizziness. This latter dizziness occurs almost every morning when she sits up from a supine or prone position. The dizziness can also occur if she stands up too fast from a seated position. Whenever she gets dizzy for any reason, she often gets black spots in front of her eyes and feels like she will pass out.   4). Abnormal periods and excessive menstrual and intermenstrual bleeding: Menarche occurred at age 22. Periods have never been regular. When she took most OCPs she had persistent vaginal bleeding. Seasonale caused her to have menses every 3 months for about one year, then it became less effective. She continued to have heavy spotting. This problem has been sometimes attributed to a  connective tissue disorder.    5). 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 Her initial [facility] consultation occurred on 03/15/12.      1). The report of her 2009 muscle BX was reviewed. The genetics reports from [facility] done in July 2010 and from [place] in 2010 were reviewed. Also reviewed was a report from Dr. Salomon Fick of the [facility] who did not endorse support for a diagnosis of mitochondrial disease.      2). [facility] geneticist noted that none of Joycelin's previous testing supported a diagnosis of a mitochondrial disorder. The geneticist also stated that no specific genetic disorders are known to cause her constellation of symptoms shared by her and her younger brother, including Chiari I malformation. Genome sequencing testing,  to determine if River and her brother had a unique gene mutation underlying their presentations, was offered to the family. Unfortunately the costs of that testing were too high for the family to agree to the testing     B). Her [facility] record note from 10/15/15 shows that Takhia's previous tests for myopathy and mitochondrial function were all negative. Genetic whole exome testing was negative for a 19 y.o. female with Chiari I malformation, fatigue, muscle weakness, feeding difficulties, tethered cord, and clinical diagnosis of Ehlers-Danlos syndrome. GeneDX testing showed a variant in the mitochondrial genome of uncertain significance that was found in 200% of Anays's mitochondria and 100% of her mother's mitochondria. Brother presumably had the same variant. It was noted that since Aiea and Braxton inherited the same change as the mother and at the same levels, it was unlikely that this variant was causing Lilybeth's and Braxton's symptoms, because the mother did not exhibit similar symptoms. [Addendum 02/05/18: Mom says that she did have some of the same symptoms that her children had, but the geneticist might not have realized that mom did have some of the same symptoms. Mom did not have as many symptoms as Shelbie and Braxton had.] Multiple rounds of genetic testing including WES and mitochondrial genome sequencing, were not suggestive of mitochondrial disorder or any other genetic diseases. The family was offered the option of applying for further testing in the [facility]. The family applied for that program, but were not accepted due to the fact that the program wanted patients with different issues.     C). When Zivah saw Dr Penni Bombard, a geneticist in [place], he confirmed that she does have a mitochondrial disorder and also has Ehlers-Danlos syndrome.     6). Feeling unwell, fatigue, fevers, headaches, nasal and head congestion, lack of appetite: These problems vary from one part of  the day to another. She has at least one of them every day.     A). Fatigue: She does not feel tired when she awakens, but does begin to feel tired about 2 PM. If she naps for an hour or two, the fatigue improves for several hours, but then recurs around 6 PM. If she does not nap, she remains tired and develops a bitemporal headache about 4-5 PM. She occasionally has a problem with insomnia, but not with early awakening. Sleeping in on weekends does not improve her fatigue.      B). Fevers: When she feels bad she usually has fevers. Most of her fevers are in the 98.8-99.2 range. These "fevers" occur about 5-7 PM.     C). Nasal and head congestion: She has had a stuffed up nose for 4-5 months.  When she has nasal congestion she also sometimes has head congestion. However, she does not have head congestion without having nasal congestion.      D). Headaches: She often has nuchal headaches when her trapezius muscles are sore/stiff. Sometimes these nuchal headache extend to the vertex. She also has bitemporal headaches when she gets tired.   F. Pertinent family history:   1). Stature and puberty: Mom is 5-1. Dad is 5-5. Mom had menarche at age 39. Mom has had irregular menses prior to her first pregnancy. She was diagnosed with endometriosis after her first pregnancy. After treatment for endometriosis, presumably GnRH analogues for 6 months, her menses were normal.     2). Obesity: Maternal grandmother   3). DM: Maternal grandmother has T2DM. A maternal grand uncle had DM, possibly type 1.    4). Thyroid: 84 y.o. sister has had a few abnormal thyroid hormone levels. Paternal grandmother is hypothyroid, without having had thyroid surgery, irradiation. or been on a prolonged low iodine diet.    5). ASCVD: Mother has a heart murmur.    6). Cancers: paternal grandfather dies of lung CA. There was also leukemia in a distant paternal relative.    7). Others: Older sister has had oligomenorrhea frequently. Mother  has migraines. Younger brother has worse mitochondrial dysfunction and seizures. Maternal grandmother has osteoarthritis and fibromyalgia.  Maternal grandmother takes B12 shots for anemia. Maternal grandmother also has hypertension.  One maternal uncle had cardiomyopathy. Maternal grand uncle had his first CVA at age 9. A distant paternal second cousin and a paternal first cousin had lupus.]    G. Lifestyle:   1). Shanyla's diet: She can eat food normally and typically eats 3 small meals per day. She estimates that about 40% of her calories comes from oral intake. She does not eat much at any one time. She is not a "foodie" by nature and is a very picky eater.  She has G-tube feedings every night: 2 cans of Vital Peptide, 1.5 calories per mL, continuous feeding, from 9:30 PM to 7:30 AM via a pump. She does not usually take fluids by the G-tube during the day, but may do so during the Summer months to prevent dehydration.    2). She receives iv fluid infusions by her port-a-cath every other week: D5 1/2 NS, three 1000 ml bags over 12 hours. If she gets sick or gets dehydrated, she gets 3000 ml of D10NS iv over 12 hour. She receives substantial home health nursing support from [facility].     3). Physical activities: She does not walk much for exercise because walking causes her feet to swell. She does not go outside much during the Winter months. She was told by one doctor that she had angioedema.    4). Medications:    A). Morning medications: CoQ10, vitamin E, vitamin C. Prevacid, and allergy medications. Breakfast is usually a fruit smoothy.     B). Bedtime: LT4, Topamax, Lyrica, Seasonale,  2. Clinical course:  A. In May 2020 she had a central venous catheter access device inserted. The catheter is working well.  Crissie Figures saw Dr. Damita Lack on 11/19/18. Her PFTs were essentially normal. The MIP/MEPs were better, but still low. Her sleep study was normal. He put her on Mestinon three times daily.  The Mestinon helped for several hours.   C. She also saw Dr. Artis Flock on 11/19/18. Dr. Artis Flock ordered an EMG to be done at [facility].   DGerarda Gunther has a long  history of developing physical symptoms that she attributes to adverse reactions to medications and then stopping those medications without discussing the issues with the physicians that prescribed the medications. I wonder if some of those reactions were really due to the medications involved. She also has a history of not always following up with specialists that she has seen if what they prescribed she felt was ineffective or might have caused adverse reactions .   3. Errin's last [facility] clinic visit occurred on 02/10/19. I continued her on Synthroid 88 mcg/day and started her on fludrocortisone, 0.1 mg/day. She stopped the medication about a month later because she thought it made her feel worse.   A. In the interim she has been fairly healthy, but has had some intermittent URI symptoms. She still has had temperatures in the 99.4-100.0 in the evenings, but normalize the next day.  She is still pretty tired. She is having alternating sensations of being hot and cold. She thinks her headaches worse, in that they are more severe. She still has orthostatic dizziness.   B.  On 03/03/18 Dr. Artis Flock had a video visit with Midori and increased her mestinon to 120 mg, three times daily. Lois feels that the mestinon is helping "a little bit".   C. On 03/06/19 she saw a GI specialist at [facility]. She was treated with IBGard as needed. She did not feel that the IBGard helped very much and did not contact the specialist for follow up.   D. Her breathing difficulties have remained about the same. Dr. Damita Lack feels that she may have muscle weakness.    E. On 03/24/19 Greenley's G-tube and port-a-cath were replaced.    F. On 05/11/19 she was seen in ortho. A diagnosis of bilateral shoulder subluxations was made, presumably due to Ehlers-Danlos syndrome.  She is in PT now.   G. On 06/23/19 she had a NCV/EMG study at [facility]. Family does not know the results.   Damaris Schooner is still receiving feedings orally and also via her G-tube. She still takes 88 mcg/day of Synthroid. She still has weekly 12-hour iv infusions of D5. She feels better for about a week after the infusions.  4. Pertinent Review of Systems:  Constitutional: Tine feels "tired". She is colder than others most of the time. When she is out in the heat during the Summer, she now sometimes sweats. Headaches are somewhat worse. She also has frequent bilateral ear aches and popping of both ears. 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 She has not been evaluated by ENT.  Eyes: Vision is "not great". She saw a neuroophthalmologist on 06/16/19. The possibility of optic neuropathy, similar to her brother, was raised. Her glasses help a little bit.  There are no other significant eye complaints. Neck: The patient has had occasional complaints of anterior neck soreness and difficulty swallowing.  Heart: Her heart rate is usually in the 60-70s, but when she is tired the heart rate can be in the 50s-60s. The patient has no complaints of palpitations, irregular heat beats, chest pain, or chest pressure. Gastrointestinal: As above. She has frequent nausea in the middle of the night. The nausea can last for up to three hours if she does not take Zofran. She sometimes also has nausea during the day, but she has not been able to identify a particular trigger. She also occasionally has some mild reflux. She infrequently has hypogastric pains, usually associated with being constipated  or having a menstrual period. Bowel movents alternate from diarrhea to constipation. The patient has no complaints of excessive hunger. Hands: Always wet and cold.  Legs: Muscle mass and strength "are okay". She occasionally has pains of her anterior thighs and anterior shins. There are no other  complaints of numbness, tingling, burning, or pain. She notes edema about 3-4 days per week.  Feet: Feet are always wet and cold. There are no obvious foot problems. She occasionally has numbing of the medial 3-4 toes. There are no other complaints of numbness, tingling, burning, or pain. She often has dorsal edema.  GYN: LMP occurred in May 2021. Her Seasonale contraceptive cause her to have menses about every three months.   PAST MEDICAL, FAMILY, AND SOCIAL HISTORY  Past Medical History:  Diagnosis Date  . Allergy to alpha-gal    elevated IgE 04/10/16  . Autonomic dysfunction   . Chiari I malformation (HCC)   . Complication of anesthesia    slow to wake up  . Dyspnea   . Eczema   . GERD (gastroesophageal reflux disease)   . Headache   . Heart murmur   . Hypoglycemia   . Hypothyroid   . IBS (irritable bowel syndrome)   . Joint pain   . Mitochondrial disease (HCC)   . Muscle pain   . Pneumonia    several times  . PONV (postoperative nausea and vomiting)     Family History  Problem Relation Age of Onset  . Asthma Mother   . Allergic rhinitis Mother   . Heart murmur Mother   . Allergic rhinitis Sister   . Allergic rhinitis Brother   . Asthma Brother   . Seizures Brother   . Mitochondrial disorder Brother   . Allergic rhinitis Maternal Grandmother   . Asthma Maternal Grandmother   . Diabetes type II Maternal Grandmother   . Lung cancer Maternal Grandfather   . Angioedema Neg Hx   . Eczema Neg Hx   . Immunodeficiency Neg Hx   . Urticaria Neg Hx      Current Outpatient Medications:  .  albuterol (PROVENTIL HFA) 108 (90 Base) MCG/ACT inhaler, Inhale 2 puffs into the lungs every 4 (four) hours as needed for wheezing or shortness of breath., Disp: 6.7 g, Rfl: 2 .  ascorbic acid (VITAMIN C) 500 MG tablet, Take 500 mg by mouth daily. , Disp: , Rfl:  .  azelastine (ASTELIN) 0.1 % nasal spray, Place 2 sprays into both nostrils 2 (two) times daily. Use in each nostril as  directed, Disp: 30 mL, Rfl: 5 .  clobetasol (OLUX) 0.05 % topical foam, Apply topically 2 (two) times daily. Apply to affected areas twice daily as needed taking care to avoid axillae and groin area. (Patient taking differently: Apply 1 application topically 2 (two) times daily. Apply to affected areas twice daily as needed taking care to avoid axillae and groin area.), Disp: 45 g, Rfl: 3 .  Coenzyme Q-10 200 MG CAPS, Take 1 capsule by mouth daily. , Disp: , Rfl:  .  EPINEPHrine 0.3 mg/0.3 mL IJ SOAJ injection, Inject 0.3 mg into the muscle once. As needed, Disp: , Rfl:  .  lansoprazole (PREVACID) 30 MG capsule, TAKE 1 CAPSULE BY MOUTH DAILY, Disp: 90 capsule, Rfl: 2 .  leucovorin (WELLCOVORIN) 25 MG tablet, Take 25 mg by mouth 2 (two) times daily. , Disp: , Rfl:  .  levOCARNitine (CARNITOR) 1 GM/10ML solution, TAKE 2 TEASPOONFUL ( ) BY MOUTH TWICEDAILY (Patient taking differently:  Place 1,000 mg into feeding tube daily. ), Disp: 118 mL, Rfl: 12 .  lidocaine (XYLOCAINE) 5 % ointment, Apply 1 application topically as needed., Disp: 35.44 g, Rfl: 3 .  metaxalone (SKELAXIN) 400 MG tablet, Take 1 tablet (400 mg total) by mouth daily. (Patient taking differently: Take 400 mg by mouth daily as needed (Muslce pain). ), Disp: 30 tablet, Rfl: 6 .  Nutritional Supplements (ENSURE CLEAR) LIQD, 474 mLs by Gastrostomy Tube route daily. (Patient taking differently: 474 mLs by Gastrostomy Tube route 4 (four) times daily. During the day  By mouth or G tube), Disp: 60 Bottle, Rfl: 5 .  Nutritional Supplements (FEEDING SUPPLEMENT, VITAL 1.5 CAL,) LIQD, Place 1,000 mLs into feeding tube continuous. bedtime, Disp: , Rfl:  .  ondansetron (ZOFRAN-ODT) 4 MG disintegrating tablet, Take 1 tablet under the tongue every 6-8 hours as needed for nausea. (Patient taking differently: Take 4 mg by mouth every 6 (six) hours as needed for nausea or vomiting. 6-8 hours as needed for nausea.), Disp: 30 tablet, Rfl: 5 .  pregabalin  (LYRICA) 75 MG capsule, TAKE 1 CAPSULE BY MOUTH IN THE MORNING AND 2 CAPSULES IN THE EVENING FOR LEG PAIN AND HEADACHE (Patient taking differently: Take 75-150 mg by mouth See admin instructions. TAKE 75 mg in the morning and 150 mg in the evening  FOR LEG PAIN AND HEADACHE), Disp: 90 capsule, Rfl: 5 .  PRESCRIPTION MEDICATION, Apply 1 application topically as needed (port). Elma- cream, Disp: , Rfl:  .  promethazine (PHENERGAN) 25 MG tablet, Take 1 tablet at onset of migraine. May repeat every 6 hours PRN (Patient taking differently: Take 25 mg by mouth every 6 (six) hours as needed for nausea (migraine.). ), Disp: 30 tablet, Rfl: 0 .  pyridostigmine (MESTINON) 60 MG tablet, TAKE 1 TABLET BY MOUTH 3 TIMES DAILY, Disp: 30 tablet, Rfl: 3 .  rizatriptan (MAXALT) 10 MG tablet, TAKE 1 TABLET BY MOUTH AS NEEDED AS DIRECTED (Patient taking differently: Take 10 mg by mouth as needed for migraine. ), Disp: 12 tablet, Rfl: 3 .  SYNTHROID 88 MCG tablet, TAKE 1 TABLET BY MOUTH EVERY MORNING ON AN EMPTY STOMACH, Disp: 90 tablet, Rfl: 1 .  Topiramate ER (TROKENDI XR) 50 MG CP24, Take 50 mg by mouth daily., Disp: 31 capsule, Rfl: 3 .  cromolyn (GASTROCROM) 100 MG/5ML solution, Take 5 mLs (100 mg total) by mouth 4 (four) times daily -  before meals and at bedtime. (Patient not taking: Reported on 07/25/2019), Disp: 600 mL, Rfl: 12 .  fludrocortisone (FLORINEF) 0.1 MG tablet, Take one tablet each morning. (Patient not taking: Reported on 07/25/2019), Disp: 30 tablet, Rfl: 11 .  ibuprofen (ADVIL) 800 MG tablet, Take 1 tablet (800 mg total) by mouth every 8 (eight) hours as needed. (Patient not taking: Reported on 07/25/2019), Disp: 30 tablet, Rfl: 0 .  levocetirizine (XYZAL) 5 MG tablet, TAKE 1 TABLET BY MOUTH EVERY EVENING (Patient not taking: Reported on 07/25/2019), Disp: 30 tablet, Rfl: 1 .  levonorgestrel-ethinyl estradiol (SEASONALE) 0.15-0.03 MG tablet, TAKE 1 TABLET BY MOUTH DAILY (Patient taking differently: Take 1  tablet by mouth daily. ), Disp: 1 Package, Rfl: 3  Allergies as of 07/25/2019 - Review Complete 07/25/2019  Allergen Reaction Noted  . Other Anaphylaxis 03/29/2013  . Latex Rash 04/08/2012  . Lactated ringers  05/28/2018  . Propofol Other (See Comments) 05/17/2012  . Tape Itching and Other (See Comments) 05/17/2012     reports that she has never smoked. She has  never used smokeless tobacco. She reports that she does not drink alcohol and does not use drugs. Pediatric History  Patient Parents  . Elrod,Leesa (Mother)  . Hilbert Corrigan (Father)   Other Topics Concern  . Not on file  Social History Narrative   Shaniquia graduated from [employer]. Will attend [employer].    She enjoys reading, playing with her cat and her dog, and hanging out with her sister.     1. School and Family: She will transfer to [employer] and live at home. She will have both in-person and on-line classes 2. Activities: Sedentary 3. Primary Care Provider: [name], MD  4. Peds Neurology: Dr. [name], MD 5. Allergy: Dr. Malachi Bonds, [facility] [place] 6. GI: No one at present 7. Peds [facility]inology: [facility] 8. Peds Pulmonary: Dr. Damita Lack 9. Genetics: [facility] in the past  REVIEW OF SYSTEMS: There are no other significant problems involving Tameya's other body systems.    Objective:  Objective  Vital Signs:  BP 108/64   Pulse 72   Ht 5' 4.06" (1.627 m)   Wt 115 lb 6.4 oz (52.3 kg)   LMP  (LMP Unknown) Comment: Every 3 months  BMI 19.77 kg/m    Ht Readings from Last 3 Encounters:  07/25/19 5' 4.06" (1.627 m)  06/17/19  (1.626 m)  03/24/19  (1.626 m) (45 %, Z= -0.12)*   * Growth percentiles are based on CDC (Girls, 2-20 Years) data.   Wt Readings from Last 3 Encounters:  07/25/19 115 lb 6.4 oz (52.3 kg)  06/17/19 117 lb (53.1 kg)  03/24/19 120 lb (54.4 kg) (34 %, Z= -0.42)*   * Growth percentiles are based on CDC  (Girls, 2-20 Years) data.   HC Readings from Last 3 Encounters:  No data found for [facility]   Body surface area is 1.54 meters squared. Facility age limit for growth percentiles is 20 years. Facility age limit for growth percentiles is 20 years.   PHYSICAL EXAM:  Constitutional: Shellie looks somewhat tired, but is awake and alert. Her affect is rather flat. Her insight is good. She has lost 6 pounds in 6 months. She is a bit more active.  Eyes: There is no arcus or proptosis.  Mouth: The oropharynx appears normal. The tongue appears normal. There is normal oral moisture. There is no obvious gingivitis. Thre is no oral hyperpigmentation.  Neck: There are no bruits present. The thyroid gland appears mildly enlarged.  The thyroid gland is approximately 21-22. grams in size. Both lobes are enlarged, with the left lobe being larger. The consistency of the thyroid gland is somewhat full. She has bilateral thyroid tenderness to palpation, more intense on the right.  Lungs: The lungs are clear. Air movement is good. Heart: The heart rhythm and rate appear normal. Heart sounds S1 and S2 are normal. I do not appreciate any pathologic heart murmurs. Abdomen: The abdominal size is normal. Bowel sounds are normal. The abdomen is soft and non-tender. There is no obviously palpable hepatomegaly, splenomegaly, or other masses.  Arms: Muscle mass appears appropriate for age.  Hands: There is no obvious tremor. Phalangeal and metacarpophalangeal joints appear normal. Palms are normal. Nails are pallid.  Legs: Muscle mass appears appropriate for age. There is trace edema.  Neurologic: Muscle strength is 4/5 in the UEs and 4-5/5 in th LEs. Muscle tone appears normal. Sensation to touch is normal in the legs.    LAB DATA:  Labs 03/24/19: Serum sodium 141, potassium 5.2, chloride 19, CO2 18, glucose 81  Labs 02/10/19: HbA1c 5.0%; TSH 0.73, free T4 1.3, free T3 3.0; CMP normal  Labs 11/12/18: TSH 1.12, free T4 1.38,  T3 156 (ref 71-180); prolactin 11.3 (ref 4.8-23.3)  Labs 09/16/18: Androstenedione 55 (ref 51-230), DHEAS 67 (ref 51-321)  Labs 08/30/18 at 8:43 AM: ACTH 13 (ref 6-50), cortisol 26.2 (ref 4-22), aldosterone 4 (ref < 28), plasma renin activity 2.4 (ref 0.25-5.82),   Labs 08/02/18: TSH 2.12, free T4 1.3, free T3 3.0; CMP normal; aldosterone <1 (reg 3-28), plasma renin activity (PRA) 0.92 (ref 0.25-5.82); prolactin 9.9 (ref 3-30)  Labs 02/01/18 at 8 AM: TSH 4.16 (ref 0.50-4.30, but many endocrinologists consider 3.4 to be the true physiologic upper limit of normal), free T4 1.4 (ref 0.8-1.4), free T3 2.7 (ref 3.0-4.7), TPO antibody 241 (ref <9), thyroglobulin antibody 1 (ref < or = 1); CMP normal except CO2 19; cholesterol 123, triglycerides 114, HDL 37, LDL 66; Prolactin 16.130.8 (ref 3.3-20); IGFBP-3 7.8 (ref 3.2-7.9); IGF-1 249 (ref 108-548, but very appropriate for a young woman whose height has plateaued); iron 177 (ref 27-164); CBC normal; androstenedione 46 (ref 51-230), DHEAS 74 (ref 51-321); ACTH 14, cortisol 28.9  Labs 11/25/17: TSH 1.58; CBC normal except 736 eosinophils (ref 15-500)  Labs 06/24/17: TSH 2.418, free T4 0.98  Labs 04/21/17: TSH 1.30  Labs 03/10/17: TSH 7.87, free T4 1.2; CBC normal; prolactin 7.1  Labs 06/24/16: TSH 1.960, free T4 1.12  Labs 04/10/16: TSH 2.28, TPO antibody 165 (ref <9), thyroglobulin antibody 2 (ref <2)  Labs 05/29/15: TSH 1.640, free T4 1.09  Labs 11/28/14: TSH 1.87, free T4 0.80  Labs 05/31/2014: TSH 1.680, free T4 1.23  Labs 04/19/14: TSH 1.19  Labs 09/30/13: TSH 2.53, free T4 1.14  Labs 02/04/13: TSH 0.88, T4 11.3  Labs 10/18/12: TSH 10.35 (ref 0.5-4.5), free T4 0.67 (ref 0.8-2.0)    Assessment and Plan:  Assessment  ASSESSMENT:  1-3. Hypothyroid, acquired/goiter/thyroiditis:   A. The occurrence of autoimmune thyroid disease with positive anti-thyroid antibodies is very common. What is uncommon is that her dose of levothyroxine had not changed in  about 6 years prior to her first visit with me.    B. At her visit in January 2020 her TSH was >4.0 and her TPO antibody level was higher. We increased her Synthroid dose to 88 mcg/day.   C. From January to August 2020 her goiter remained the same in overall size, but the lobes had shifted in size. The process of waxing and waning of thyroid gland size was c/w evolving Hashimoto's thyroiditis.   D. At her August visit her TFTs were at about the 30% of the physiologic range. I would usually have  increase the Synthroid dose to 100 mcg/day, but decided to await the results of her Hypothalamic-Pituitary-Adrenal (HPA) axis testing. Fortunately, those results were normal.   E. Since her August 2020 visit she has had intermittent flare ups of thyroiditis.   E. Her TFTs drawn on 11/12/18 were mid-euthyroid on her current Synthroid dose of 88 mcg/day. Her TFTs on 02/10/19 were at about the 75% of the physiologic range. She looks mildly hypothyroid now. We will continue this 88 mcg dose for now, but repeat her TFTs.  4. Dizziness/hypotension:   A. She still occasionally has elements of spinning dizziness c/w vertigo, orthostatic dizziness c/w POTS /dysautonomia and intermittent dehydration. Fortunately, these symptoms have been less frequent and less severe.   B. Chewing gum and  taking meclizine can help the vertigo.  C. Remaining well hydrated can help with the ortho stasis.    D. Since both her aldosterone and renin were "lowish" in October. It was reasonable to conduct a therapeutic trial of fludrocortisone. 0.1 mg/day. Unfortunately, she felt that the medication did not help her, so stopped it.  5-7. Weakness/fatigue/cold sensation:    A. The cause(s) of these problems is/are unclear. 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   B. Her ACTH and adrenal hormone tests were normal in January 2020 and again in August and September 2020. Her aldosterone was very low in July 2020  but normal in August.   C. She was euthyroid in July 2020, at about the 30% of the physiologic range. She might benefit from a small increase in Synthroid dose once we re-check her TFTs.   D. She could have fibromyalgia, chronic fatigue, or some other "autoimmune" problem. 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   E. Giving the infusions of D5W every week has made Ziyan feel better.   F. Hot showers cause her to feel weak and dizzy. This problem is c/w orthostatic hypotension and with dysautonomia. 8. Hypoglycemia:   A. The cause(s) of this problem is/are also unclear. Her hypothalamic-pituitary-hepatic axis for [facility] and her hypothalamic-pituitary-adrenal axis seemed to be normal previously. She could have celiac disease, but her tests in December 2019 were negative. She could have a problem with glycogen storage, glycogenolysis, and/or gluconeogenesis. The fact that she often does not eat much, and the fact that she appears to have a very active gastro-colic reflex may interact to impair glucose intake, glucose absorption, and glycogen synthesis.  B. She has not had any hypoglycemic symptoms recently.  9. Abnormal menses: The cause(s) of this problem is/are also unclear. Mother gives a good history for her own endometriosis that resolved after treatment with GnRH agonists.  10. Alternating constipation and diarrhea: She has a very active gastro-colic reflex. She was seen by GI in February 2021, but has not returned to clinic.  11. Hyperprolactinemia:   A. It is highly likely that her elevated prolactin In January 2020 was due to her hypothyroidism.   B. Her prolactin level in July 2020 was well within normal, paralleling her improvement in thyroid hormone status. Her prolactin level in October 2020 was also quite normal.     12. Hyperhidrosis, palms and soles: She may have dysautonomia.  13. Dehydration: She is consciously drinking 32 ounces of fluid per day. This problem could be due to an  inadequate renin-angiotensin-aldosterone system, or to chronic diarrhea, or to other causes.   14. Bradycardia:   A. She is not on a beta-blocker or combined alpha-beta blocker. The fact that mom also has bradycardia suggests a dysautonomia. Hypothyroidism may also have been a causal factor in the past.  B. Recently, most of her HRs have been in the 60s-70s. 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 The aldosterone level and the renin level in August were normal. Her renal function and her adrenal functions were normal in August and September. As noted above, we tried to  conduct a therapeutic trial of fludrocortisone.  16. Breathing difficulty:  Dr. Damita Lack is following this problem.  17. Pallid nail beds. We need to check her CBC and iron level  PLAN:  1. Diagnostic: I reviewed lab results. I ordered TFTs, CBC, and HbA1c today.  2. Therapeutic: Continue Synthroid  88 mcg/day for now.   3. Patient education: We discussed all of the above at very great length. We discussed the [place] method to maintain BP when she arises from a supine or sitting position.   4. Follow-up: 4 months.     David Stall, MD, CDE Pediatric and Adult Endocrinology

## 2019-07-25 ENCOUNTER — Encounter (INDEPENDENT_AMBULATORY_CARE_PROVIDER_SITE_OTHER): Payer: Self-pay | Admitting: "Endocrinology

## 2019-07-25 ENCOUNTER — Ambulatory Visit (INDEPENDENT_AMBULATORY_CARE_PROVIDER_SITE_OTHER): Payer: Commercial Managed Care - PPO | Admitting: "Endocrinology

## 2019-07-25 ENCOUNTER — Other Ambulatory Visit: Payer: Self-pay

## 2019-07-25 VITALS — BP 108/64 | HR 72 | Ht 64.06 in | Wt 115.4 lb

## 2019-07-25 DIAGNOSIS — R231 Pallor: Secondary | ICD-10-CM

## 2019-07-25 DIAGNOSIS — E063 Autoimmune thyroiditis: Secondary | ICD-10-CM

## 2019-07-25 DIAGNOSIS — R531 Weakness: Secondary | ICD-10-CM

## 2019-07-25 DIAGNOSIS — E039 Hypothyroidism, unspecified: Secondary | ICD-10-CM | POA: Diagnosis not present

## 2019-07-25 DIAGNOSIS — E049 Nontoxic goiter, unspecified: Secondary | ICD-10-CM

## 2019-07-25 DIAGNOSIS — R42 Dizziness and giddiness: Secondary | ICD-10-CM

## 2019-07-25 DIAGNOSIS — I951 Orthostatic hypotension: Secondary | ICD-10-CM

## 2019-07-25 DIAGNOSIS — R5383 Other fatigue: Secondary | ICD-10-CM

## 2019-07-25 DIAGNOSIS — R0689 Other abnormalities of breathing: Secondary | ICD-10-CM

## 2019-07-25 DIAGNOSIS — E2740 Unspecified adrenocortical insufficiency: Secondary | ICD-10-CM

## 2019-07-25 NOTE — Patient Instructions (Signed)
Follow up visit in 4 months.  

## 2019-07-26 LAB — CBC WITH DIFFERENTIAL/PLATELET
Absolute Monocytes: 442 cells/uL (ref 200–950)
Basophils Absolute: 48 cells/uL (ref 0–200)
Basophils Relative: 0.7 %
Eosinophils Absolute: 248 cells/uL (ref 15–500)
Eosinophils Relative: 3.6 %
HCT: 45.5 % — ABNORMAL HIGH (ref 35.0–45.0)
Hemoglobin: 14.7 g/dL (ref 11.7–15.5)
Lymphs Abs: 2843 cells/uL (ref 850–3900)
MCH: 28.2 pg (ref 27.0–33.0)
MCHC: 32.3 g/dL (ref 32.0–36.0)
MCV: 87.2 fL (ref 80.0–100.0)
MPV: 10.3 fL (ref 7.5–12.5)
Monocytes Relative: 6.4 %
Neutro Abs: 3319 cells/uL (ref 1500–7800)
Neutrophils Relative %: 48.1 %
Platelets: 355 10*3/uL (ref 140–400)
RBC: 5.22 10*6/uL — ABNORMAL HIGH (ref 3.80–5.10)
RDW: 12.7 % (ref 11.0–15.0)
Total Lymphocyte: 41.2 %
WBC: 6.9 10*3/uL (ref 3.8–10.8)

## 2019-07-26 LAB — T3, FREE: T3, Free: 3 pg/mL (ref 3.0–4.7)

## 2019-07-26 LAB — T4, FREE: Free T4: 1.3 ng/dL (ref 0.8–1.4)

## 2019-07-26 LAB — IRON: Iron: 79 ug/dL (ref 40–190)

## 2019-07-26 LAB — HEMOGLOBIN A1C
Hgb A1c MFr Bld: 4.8 % of total Hgb (ref <5.7)
Mean Plasma Glucose: 91 (calc)
eAG (mmol/L): 5 (calc)

## 2019-07-26 LAB — TSH: TSH: 1.67 mIU/L

## 2019-07-29 LAB — COMPREHENSIVE METABOLIC PANEL
AG Ratio: 1.8 (calc) (ref 1.0–2.5)
ALT: 11 U/L (ref 6–29)
AST: 13 U/L (ref 10–30)
Albumin: 4.8 g/dL (ref 3.6–5.1)
Alkaline phosphatase (APISO): 67 U/L (ref 31–125)
BUN: 11 mg/dL (ref 7–25)
CO2: 22 mmol/L (ref 20–32)
Calcium: 9.8 mg/dL (ref 8.6–10.2)
Chloride: 108 mmol/L (ref 98–110)
Creat: 0.78 mg/dL (ref 0.50–1.10)
Globulin: 2.7 g/dL (calc) (ref 1.9–3.7)
Glucose, Bld: 75 mg/dL (ref 65–139)
Potassium: 4.2 mmol/L (ref 3.5–5.3)
Sodium: 140 mmol/L (ref 135–146)
Total Bilirubin: 0.5 mg/dL (ref 0.2–1.2)
Total Protein: 7.5 g/dL (ref 6.1–8.1)

## 2019-07-29 LAB — THYROID PANEL WITH TSH
Free Thyroxine Index: 3.2 (ref 1.4–3.8)
T3 Uptake: 23 % (ref 22–35)
T4, Total: 13.8 ug/dL — ABNORMAL HIGH (ref 5.3–11.7)
TSH: 1.7 mIU/L

## 2019-07-29 LAB — CARNITINE / ACYLCARNITINE PROFILE, BLD

## 2019-07-29 LAB — CK: Total CK: 54 U/L (ref 29–143)

## 2019-07-29 LAB — LACTATE DEHYDROGENASE: LDH: 148 U/L (ref 100–200)

## 2019-08-08 ENCOUNTER — Telehealth (INDEPENDENT_AMBULATORY_CARE_PROVIDER_SITE_OTHER): Payer: Self-pay

## 2019-08-08 NOTE — Telephone Encounter (Signed)
Requested EMG report be faxed to our office- fax number given.

## 2019-08-09 NOTE — Progress Notes (Signed)
Patient: Laura Parrish MRN: [id number] Sex: female DOB: 09-21-2000  Provider: [name], MD Location of Care: Pediatric Specialist- Pediatric Complex Care Note type: Routine return visit  History of Present Illness: Referral Source: [name], MD History from: patient and prior records Chief Complaint:complex care  Laura HALBERT is a 19 y.o. 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 Patient was last seen by me on 03/04/2019 were Mestinon was increased to 120mg  TID.  Since that appointment, patient has undergone a port a cath removal on 03/24/2019. She has since seen Laura Parrish for ongoing care.  She recently had EMG to evaluate potential myastheia gravis.    Patient presents today with mother.  Mother and patient prvoided history.    Symptom management:   Headaches: Headaches 2-3 a week lasting most of the day, she has aura of vision/hearing changes, worsens with light or sounds, she has nausea and its improved with zofran. They typically start in the L temporal area and then expand. They can last for a whole day from when she wakes up until she goes to sleep. This week she had one that lasted for 3 days. She needed multiple doses of phenergan. When she tried to sleep, changing positions seemed to worsens it. She is foggy while the migraine is occurring but she has return to baseline mental clarity when the headache goes away. She mentions that she has increased weakness with headaches. In the last few months her headaches have changed in frequency and in location.  They occur more in the back of head than it had been in the past when they used to occur more frontal.  She will typically get 10-12 hrs of sleep a night and she will sleep restfully. She will sometimes wakes up with her feet cramping or have sharp point tenderness in her hips. She endorses similar sensations during the day.   She does not have much screen time (2hrs), she does not endorse any visual changes. She has minimal pain or difficulty focusing to read books.   She transferred to [employer]. She is a [employer] and a history major and likes social studies education. She will be deciding her thesis focus soon. She likes her smaller classes.   Breathing: Her mestinon appears to help her breathing a little. She states that it is a noticeable change but not great. "It feels like a band-aid", and doesn't appear to fix the issue. She will only gets 2-3 hours out of the medication if she's good. She often feels short of breath and chest pain is located  right in the middle of her chest. Most deep breaths hurt and she feels winded after 2-3 minutes of walking which is not improved after rest. Nothing else seems to help her breathing. She has history of recent illnesses, no increased cough or secretions, this has been developing consistently over the past few years.   Hyperhidrosis: Hands are always sweaty. She tried the lotion, but it didn't seem to help much. It seems to get worse when her body is stressed. They are very sweaty after she has her infusions. There is no particular odor to the sweat it is just  profuse. She was applying her prescribed lotion every other hour. Hand sanitizer wipes the lotion off. She tried powder and it didn't help. She is interested in trying Botox in her hands. She tried to see a local orthopedic doctor but did not like their experience.   Care coordination (other providers): Appointment w/ genetic specialist down in [place] -- Laura Parrish, yearly virtual follow  up  Care management needs: Dr. Heinz Knuckles at [facility] --> she does Botox would like appointment with her Need her accommodations letter for school - Laura Parrish has letter nothing has changed Need audiology appointing   Equipment needs: N/A - redid bedroom put solid flooring with few transition places, need maybe a new IV pole, put a shower chair in the bathroom, new ramp into the house last year  Diagnostics/Patient history: (copied from care plan): Laura Parrish is a young woman with complicated history including Chairi malformation s/p repair, tethered cord s/p repair, feeding intolerance s/p gastrostomy tube, lactose intolerance and alpha gal allergy,  multiple hormonal irregularities including hypothyroidism, chronic headaches, chronic Gi complaints, neuropathic pain in her feet,  and multiple other symptoms concerning for mitochondrial diease although genetic testing and muscle biopsy unrevealing. We also have growing concern for connective tissue disease, however this has not been diagnostically proven either. She now receives fluid infusion via portacath every 2 weeks for energy failure and related to inability to tolerate enteral fluids, and has been much more stable with these infusions.     Past Medical History Past Medical History:  Diagnosis Date  . Allergy to alpha-gal    elevated IgE 04/10/16  . Autonomic dysfunction   . Chiari I malformation (HCC)   . Complication of anesthesia    slow to wake up  . Dyspnea   . Eczema   . GERD (gastroesophageal reflux disease)   . Headache   . Heart murmur   . Hypoglycemia   . Hypothyroid   . IBS (irritable bowel syndrome)   . Joint pain   . Mitochondrial disease (HCC)   . Muscle pain   . Pneumonia    several times  . PONV (postoperative nausea and vomiting)     Surgical History Past Surgical History:  Procedure Laterality Date  . ADENOIDECTOMY     2004or 2005  . chiari decompression  2007  . GALLBLADDER SURGERY  2015  . GASTROSTOMY  TUBE PLACEMENT     x 2  . MUSCLE BIOPSY  2010  . Port Replacement  05/2018   x 2 orignal 08/2016 then replacement 05/2018  . PORT-A-CATH REMOVAL N/A 03/24/2019   Procedure: PORT-A-CATH REMOVAL;  Surgeon: Sheliah Hatch De Blanch, MD;  [facility];  Service: General;  Laterality: N/A;  . PORTACATH PLACEMENT Right 03/24/2019   Procedure: PORT-A-CATH INSERTION under ultrasound and fluoroscopic guidance;  Surgeon: Sheliah Hatch De Blanch, MD;  Location: [facility] OR;  Service: General;  Laterality: Right;  . REMOVAL OF GASTROSTOMY TUBE N/A 03/24/2019   Procedure: Exchange of Gastrostomy Tube;  Surgeon: Rodman Pickle, MD;  Location: [facility] OR;  Service: General;  Laterality: N/A;  . tethered cord  07/2006  . TONSILLECTOMY     [id number]    Family History family history includes Allergic rhinitis in her brother, maternal grandmother, mother, and sister; Asthma in her brother, maternal grandmother, and mother; Diabetes type II in her maternal grandmother; Heart murmur in her mother; Lung cancer in her maternal grandfather; Mitochondrial disorder in her brother; Seizures in her brother.   Social History Social History  Social History Narrative   Laura Parrish graduated from [employer]. Will attend [employer].    She enjoys reading, playing with her cat and her dog, and hanging out with her sister.     Allergies Allergies  Allergen Reactions  . Other Anaphylaxis    Alpha Gal allergy \ Red meat\ any hoof animal  . Latex Rash  . Lactated Ringers     Contraindicated w/ mitochondrial syndrome  . Propofol Other (See Comments)    Due to possible mitochondrial disorder Due to possible mitochondrial disorder Due to possible mitochondrial disorder  . Tape Itching and Other (See Comments)    Medications Current Outpatient Medications on File Prior to Visit  Medication Sig Dispense Refill  . albuterol (PROVENTIL HFA) 108 (90 Base) MCG/ACT inhaler Inhale 2 puffs into the lungs every  4 (four) hours as needed for wheezing or shortness of breath. 6.7 g 2  . ascorbic acid (VITAMIN C) 500 MG tablet Take 500 mg by mouth daily.     Marland Kitchen. azelastine (ASTELIN) 0.1 % nasal spray Place 2 sprays into both nostrils 2 (two) times daily. Use in each nostril as directed 30 mL 5  . clobetasol (OLUX) 0.05 % topical foam Apply topically 2 (two) times daily. Apply to affected areas twice daily as needed taking care to avoid axillae and groin area. (Patient taking differently: Apply 1 application topically 2 (two) times daily. Apply to affected areas twice daily as needed taking care to avoid axillae and groin area.) 45 g 3  . Coenzyme Q-10 200 MG CAPS Take 1 capsule by mouth daily.     . cromolyn (GASTROCROM) 100 MG/5ML solution Take 5 mLs (100 mg total) by mouth 4 (four) times daily -  before meals and at bedtime. 600 mL 12  . EPINEPHrine 0.3 mg/0.3 mL IJ SOAJ injection Inject 0.3 mg into the muscle once. As needed    . fludrocortisone (FLORINEF) 0.1 MG tablet Take one tablet each morning. 30 tablet 11  . ibuprofen (ADVIL) 800 MG tablet Take 1 tablet (800 mg total) by mouth every 8 (eight) hours as needed. 30 tablet 0  . lansoprazole (PREVACID) 30 MG capsule TAKE 1 CAPSULE BY MOUTH DAILY 90 capsule 2  . leucovorin (WELLCOVORIN) 25 MG tablet Take 25 mg by mouth 2 (two) times daily.     Marland Kitchen. levOCARNitine (CARNITOR) 1 GM/10ML solution TAKE 2 TEASPOONFUL (10ML) BY MOUTH TWICEDAILY (Patient taking differently: Place 1,000 mg into feeding tube daily. ) 118 mL 12  . levocetirizine (XYZAL) 5 MG tablet TAKE 1 TABLET BY MOUTH EVERY EVENING 30 tablet 1  . levonorgestrel-ethinyl estradiol (SEASONALE) 0.15-0.03 MG tablet TAKE 1 TABLET BY MOUTH DAILY (Patient taking differently: Take 1 tablet by mouth daily. ) 1 Package 3  . lidocaine (XYLOCAINE) 5 % ointment Apply 1 application topically as needed. 35.44 g 3  . metaxalone (SKELAXIN) 400 MG tablet Take 1 tablet (400 mg total) by mouth daily. (Patient taking  differently: Take 400 mg by mouth daily as needed (Muslce pain). ) 30 tablet 6  . Nutritional Supplements (ENSURE CLEAR) LIQD 474 mLs by Gastrostomy Tube route daily. (Patient taking differently: 474 mLs by Gastrostomy Tube route 4 (four) times daily. During the day  By mouth or G tube) 60 Bottle 5  . Nutritional Supplements (FEEDING SUPPLEMENT, VITAL 1.5 CAL,) LIQD Place 1,000 mLs into feeding tube continuous. bedtime    . ondansetron (ZOFRAN-ODT) 4 MG disintegrating tablet Take 1 tablet under the tongue every 6-8 hours as needed  for nausea. (Patient taking differently: Take 4 mg by mouth every 6 (six) hours as needed for nausea or vomiting. 6-8 hours as needed for nausea.) 30 tablet 5  . PRESCRIPTION MEDICATION Apply 1 application topically as needed (port). Elma- cream    . promethazine (PHENERGAN) 25 MG tablet Take 1 tablet at onset of migraine. May repeat every 6 hours PRN (Patient taking differently: Take 25 mg by mouth every 6 (six) hours as needed for nausea (migraine.). ) 30 tablet 0  . pyridostigmine (MESTINON) 60 MG tablet TAKE 1 TABLET BY MOUTH 3 TIMES DAILY 30 tablet 3  . SYNTHROID 88 MCG tablet TAKE 1 TABLET BY MOUTH EVERY MORNING ON AN EMPTY STOMACH 90 tablet 1   No current facility-administered medications on file prior to visit.   The medication list was reviewed and reconciled. All changes or newly prescribed medications were explained.  A complete medication list was provided to the patient/caregiver.  Physical Exam BP 110/82   Pulse 64   Ht  (1.626 m)   Wt 119 lb 6.4 oz (54.2 kg)   LMP  (LMP Unknown) Comment: Every 3 months  BMI 20.49 kg/m  Weight for age: Facility age limit for growth percentiles is 20 years.  Length for age: Facility age limit for growth percentiles is 20 years. BMI: Body mass index is 20.49 kg/m. No exam data present  Gen: well appearing young woman Skin: No rash, No neurocutaneous stigmata. HEENT: Normocephalic, no dysmorphic features, no  conjunctival injection, nares patent, mucous membranes moist, oropharynx clear. Neck: Supple, no meningismus. No focal tenderness. Resp: Clear to auscultation bilaterally CV: Regular rate, normal S1/S2, no murmurs, no rubs Abd: BS present, abdomen soft, non-tender, non-distended. No hepatosplenomegaly or mass. Gtube in place.  Ext: Warm and well-perfused. No deformities, no muscle wasting, ROM full. Mild swelling of extremities.    Neurological Examination: MS: Awake, alert, interactive. Normal eye contact, answered the questions appropriately for age, speech was fluent,  Normal comprehension.  Attention and concentration were normal. Cranial Nerves: Pupils were equal and reactive to light;  normal fundoscopic exam with sharp discs, visual field full with confrontation test; EOM normal, no nystagmus; no ptsosis, no double vision, intact facial sensation, face symmetric with full strength of facial muscles, hearing intact to finger rub bilaterally, palate elevation is symmetric, tongue protrusion is symmetric with full movement to both sides.  Sternocleidomastoid and trapezius are with normal strength. Motor-Normal tone throughout, Normal strength in all muscle groups. No abnormal movements Reflexes- Reflexes 2+ and symmetric in the biceps, triceps, patellar and achilles tendon. Plantar responses flexor bilaterally, no clonus noted Sensation: Intact to light touch throughout.  Romberg negative. Coordination: No dysmetria on FTN test. No difficulty with balance when standing on one foot bilaterally.   Gait: Normal gait. Tandem gait was normal. Was able to perform toe walking and heel walking without difficulty.   Diagnosis:  1. Hyperhidrosis of palms and soles   2. Migraine without aura and without status migrainosus, not intractable   3. Hearing loss, unspecified hearing loss type, unspecified laterality   4. Myasthenia gravis (HCC)      Assessment and Plan Laura Parrish is a 19 y.o. female  with history of Chairi malformation s/p repair, tethered cord s/p repair, feeding intolerance s/p gastrostomy tube, lactose intolerance and alpha gal allergy, multiple hormonal irregularities including hypothyroidism, chronic headaches, chronic GI complaints, neuropathic pain in her feet, and symptoms concerning for mitochondrial diease although genetic testing and muscle biopsy unrevealing who  presents for follow-up in the pediatric complex care clinic. I reviewed EMG. Unfortunately, despite specific instructions to test for myasthenia, testing was focused on lower extremities with evidence of neuropathy.  I advised that this unfortunately does not give [place] a clear answer on myasthenia symptoms.  Per family, neuromuscular physician interested in seeing Laticia back.  I recommend discussing repeat EMG for myasthenia-like illness at next appointment.    In addition, patient has been complaining of headaches whereas they were previously well controlled. I gave patient the option of increasing her dosage of Topamax to help with headaches. I informed patient that this could cause grogginess. We discussed other medication options such as Cyproheptadine.  Mother informed me they had tried it in the past but it caused daytime sleepiness. In the end we settled on increasing the Topamax. I asked they keep me updated on how she does on the increase. Patient expressed interest in Botox for hyperhidrosis of her hands and feet. I informed them that I would place a referral to to orthopedics. I also recommended we repeat EMG and patient see a neuromuscular specialist, audiology and ENT to help manage symptoms. Patient seen by case manager, dietician, integrated behavioral health today as well, please see accompanying notes. I discussed case with all involved parties for coordination of care and recommend patient follow their instructions as below.   Symptom management:  - Increase topamax - Refill for Maxalt sent  - Restart  Lyrica at 25mg  in the morning and afternoon and 150mg  at night.   - Consider botox or CGRP inhibitor if these interventions are not helpful.   - Order for repeat EMG sent  Care coordination: - Referral sent to orthopedics for Botox sent - Referral to audiology sent  - Referral to neuromuscular specialist sent - Referral to ENT sent   The CARE PLAN for reviewed and revised to represent the changes above.  This is available in Epic under snapshot, and a physical binder provided to the patient, that can be used for anyone providing care for the patient.    Return in about 3 months (around 11/10/2019). Also schedule appointment with in 6 weeks to follow up headaches and referrals.   11/12/2019, MD Pediatrics, PGY1  The patient was seen and the note was written in collaboration with Dr Laura Parrish.  I personally reviewed the history, performed a physical exam and discussed the findings and plan with patient and his mother. I also discussed the plan with pediatric resident.   I personally spend 40 minutes on day of service on this patient including reviewing results with family, discussion with patient and family, coordination with other providers, and review of chart.    Judith Blonder MD MPH Neurology,  Neurodevelopment and Neuropalliative care [facility] Child Neurology  [address] [place], [place], [place] Phone: [phone]

## 2019-08-10 ENCOUNTER — Encounter (INDEPENDENT_AMBULATORY_CARE_PROVIDER_SITE_OTHER): Payer: Self-pay | Admitting: Family

## 2019-08-10 ENCOUNTER — Ambulatory Visit (INDEPENDENT_AMBULATORY_CARE_PROVIDER_SITE_OTHER): Payer: Commercial Managed Care - PPO | Admitting: Pediatrics

## 2019-08-10 ENCOUNTER — Telehealth: Payer: Self-pay | Admitting: Family

## 2019-08-10 ENCOUNTER — Other Ambulatory Visit: Payer: Self-pay

## 2019-08-10 VITALS — BP 110/82 | HR 64 | Ht 64.0 in | Wt 119.4 lb

## 2019-08-10 DIAGNOSIS — G7000 Myasthenia gravis without (acute) exacerbation: Secondary | ICD-10-CM | POA: Diagnosis not present

## 2019-08-10 DIAGNOSIS — L74512 Primary focal hyperhidrosis, palms: Secondary | ICD-10-CM

## 2019-08-10 DIAGNOSIS — H9190 Unspecified hearing loss, unspecified ear: Secondary | ICD-10-CM

## 2019-08-10 DIAGNOSIS — G43009 Migraine without aura, not intractable, without status migrainosus: Secondary | ICD-10-CM

## 2019-08-10 DIAGNOSIS — L74513 Primary focal hyperhidrosis, soles: Secondary | ICD-10-CM

## 2019-08-10 MED ORDER — PREGABALIN 75 MG PO CAPS: 150.0000 mg | ORAL_CAPSULE | Freq: Every day | ORAL | 3 refills | Status: DC

## 2019-08-10 MED ORDER — TOPIRAMATE 25 MG PO TABS: 25.0000 mg | ORAL_TABLET | Freq: Every day | ORAL | 3 refills | Status: DC

## 2019-08-10 MED ORDER — RIZATRIPTAN BENZOATE 10 MG PO TABS: 10.0000 mg | ORAL_TABLET | ORAL | 3 refills | Status: DC | PRN

## 2019-08-10 MED ORDER — PREGABALIN 25 MG PO CAPS: 25.0000 mg | ORAL_CAPSULE | Freq: Two times a day (BID) | ORAL | 3 refills | Status: DC

## 2019-08-10 MED ORDER — TROKENDI XR 50 MG PO CP24: 50.0000 mg | ORAL_CAPSULE | Freq: Every day | ORAL | 3 refills | Status: DC

## 2019-08-10 NOTE — Telephone Encounter (Signed)
I called and clarified both lyrica prescriptions to the pharmacy.

## 2019-08-10 NOTE — Telephone Encounter (Signed)
  Who's calling (name and relationship to patient) :[facility]   Best contact number:[phone]  Provider they GSU:PJSR Goodpasture   Reason for call:Medication clarification       PRESCRIPTION REFILL ONLY  Name of prescription: Lyrica   Pharmacy:[facility]

## 2019-08-12 ENCOUNTER — Encounter (INDEPENDENT_AMBULATORY_CARE_PROVIDER_SITE_OTHER): Payer: Self-pay

## 2019-09-05 ENCOUNTER — Encounter (INDEPENDENT_AMBULATORY_CARE_PROVIDER_SITE_OTHER): Payer: Self-pay | Admitting: Pediatrics

## 2019-09-07 ENCOUNTER — Ambulatory Visit (INDEPENDENT_AMBULATORY_CARE_PROVIDER_SITE_OTHER): Payer: Commercial Managed Care - PPO | Admitting: "Endocrinology

## 2019-09-21 ENCOUNTER — Encounter (INDEPENDENT_AMBULATORY_CARE_PROVIDER_SITE_OTHER): Payer: Self-pay | Admitting: Family

## 2019-09-21 ENCOUNTER — Telehealth (INDEPENDENT_AMBULATORY_CARE_PROVIDER_SITE_OTHER): Payer: Commercial Managed Care - PPO | Admitting: Family

## 2019-09-21 VITALS — Ht 63.0 in | Wt 120.0 lb

## 2019-09-21 DIAGNOSIS — E8840 Mitochondrial metabolism disorder, unspecified: Secondary | ICD-10-CM | POA: Diagnosis not present

## 2019-09-21 DIAGNOSIS — H903 Sensorineural hearing loss, bilateral: Secondary | ICD-10-CM | POA: Diagnosis not present

## 2019-09-21 DIAGNOSIS — H8080 Other otosclerosis, unspecified ear: Secondary | ICD-10-CM

## 2019-09-21 DIAGNOSIS — Q068 Other specified congenital malformations of spinal cord: Secondary | ICD-10-CM

## 2019-09-21 DIAGNOSIS — G43009 Migraine without aura, not intractable, without status migrainosus: Secondary | ICD-10-CM

## 2019-09-21 DIAGNOSIS — G935 Compression of brain: Secondary | ICD-10-CM

## 2019-09-21 DIAGNOSIS — R197 Diarrhea, unspecified: Secondary | ICD-10-CM

## 2019-09-21 DIAGNOSIS — H9190 Unspecified hearing loss, unspecified ear: Secondary | ICD-10-CM | POA: Diagnosis not present

## 2019-09-21 DIAGNOSIS — R5382 Chronic fatigue, unspecified: Secondary | ICD-10-CM

## 2019-09-21 DIAGNOSIS — G950 Syringomyelia and syringobulbia: Secondary | ICD-10-CM

## 2019-09-21 DIAGNOSIS — Z9189 Other specified personal risk factors, not elsewhere classified: Secondary | ICD-10-CM

## 2019-09-21 DIAGNOSIS — K580 Irritable bowel syndrome with diarrhea: Secondary | ICD-10-CM

## 2019-09-21 MED ORDER — RIFAXIMIN 550 MG PO TABS: 550.0000 mg | ORAL_TABLET | Freq: Two times a day (BID) | ORAL | 0 refills | Status: AC

## 2019-09-21 NOTE — Progress Notes (Signed)
This is a Pediatric Specialist E-Visit follow up consult provided via MyChart MANDEE PLUTA and her mother Nikolina Simerson consented to an E-Visit consult today.  Location of patient: Laura Parrish is at home Location of provider: Damita Dunnings is at office Patient was referred by Laura Likens, MD   The following participants were involved in this E-Visit: CMA, NP, patient and her mother  Chief Complain/ Reason for E-Visit today: Mitochondrial disorder follow up Total time on call: 30 min Follow up: 2 months  Laura Parrish   MRN:  [id number]  August 12, 2000   Provider: [name] NP-C Location of Care: [facility]  Visit type: Routine return visit  Last visit: 08/10/2019  Referral source: Army Melia, MD History from: patient, her mother, Epic chart and records from Dr Rutherford Limerick, geneticist  Brief history:  Copied from previous record History of Chairi malformation s/p repair, tethered cord s/p repair, feeding intolerance s/p gastrostomy tube, lactose intolerance and alpha gal allergy, multiple hormonal irregularities including hypothyroidism, chronic headaches, chronicGIcomplaints, neuropathic pain in her feet, and symptoms concerning for mitochondrial diease although genetic testing and muscle biopsy have been unrevealing. She has a Port-a-cath and receives infusions weekly. She is taking Mestinon which has improved her fatigue. She has not had single fiber EMG to determine a diagnosis of myasthenia gravis. She is also followed by Dr Rutherford Limerick, geneticist. She recently saw Dr Penni Bombard virtually on 08/11/2019.  Today's concerns: Elveria and her mother report today that her headaches have improved somewhat since Lyrica was added to her regimen. She estimates about 2 headaches per week. She has not missed any school or other activities due to headache. Dr Penni Bombard, geneticist has recommended CGRP or Botox injections to treat the migraines, but that will  require definitive answers regarding the possibility of myasthenia gravis as a diagnosis.   Laura Parrish says that all her classes are virtual and that she is doing well this semester. She has accommodations in place at school for her condition. She reports getting 8-9 hours of sleep each night since she has returned to school. She sometimes awakens with cramping in her feet or pain in her hips but she is generally able to return to sleep. All of [name]'s nutrition is via g-tube. She continues to have problems with weight loss, diarrhea and abdominal cramping. Dr Penni Bombard, geneticist, recommended trial of Xifaxan  twice per day for 14 days. Mom asked if I would prescribe the medication since Dr Penni Bombard is in [place].   Laura Parrish underwent an EMG at [facility] on 06/23/2019. This study was reportedly indicative of neuropathy. At her last visit, Dr Artis Flock ordered a second EMG with specific indication for myasthenia gravis but Laura Parrish and her mother report that she has not yet been scheduled for this procedure.   Mom reports that Laura Parrish has all needed equipment at this time but that a rolling IV pole would give Anzleigh more mobility when she is receiving g-tube feedings or IV fluid infusions.  The IV fluid rate has been slowed recently due to gastric distress during the infusion and thus the infusion lasts longer than in the past. Mom also reports that with her recent infusion, when she de-accessed the portacath after the infusion, that [name] developed a large bruise at the portacath site. She declined an infusion this week because of pain and tenderness at the bruised area.   Mom also asked about an ENT referral for Laura Parrish. She said that Dr Artis Flock had planned  to refer her for the known hearing loss and the family history of otosclerosis but that she has not yet received an appointment.   Finally, Mom reports that Laura Parrish was approved for [employer] Waiver when she turns 19 years old next year but that she  needs an adaptive assessment by a psychologist to finalize the process. She plans to talk with the case manager about that.   Laura Parrish is currently taking Amoxicillin for a sinus infection. She has been otherwise generally healthy since he was last seen. Neither she nor her mother have other health concerns for her today other than previously mentioned.  Review of systems: Please see HPI for neurologic and other pertinent review of systems. Otherwise all other systems were reviewed and were negative.  Problem List: Patient Active Problem List   Diagnosis Date Noted   Bradycardia 02/06/2018   Thyroiditis, autoimmune 02/06/2018   Goiter 02/06/2018   Dizziness and giddiness 02/06/2018   Weakness 02/06/2018   Sensation of feeling cold 02/06/2018   Hypoglycemia 02/06/2018   Hyperprolactinemia (HCC) 02/06/2018   Hyperhidrosis of palms and soles 02/06/2018   Dehydration 02/06/2018   At risk for dehydration 10/12/2017   Breakthrough bleeding on birth control pills 06/25/2017   Muscle pain, cervical 06/25/2017   Inadequate fluid intake 06/25/2017   Snoring 09/28/2016   Port-A-Cath in place 09/22/2016   Iron deficiency anemia 07/23/2016   Health care home, active care coordination 07/23/2016   Complex care coordination 07/23/2016   Latex allergy 06/18/2016   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   Chronic pain 05/22/2016   Clubfoot, 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   Paresthesia 05/22/2016   Premature birth 05/22/2016   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   Allergy to insect bites and stings 04/10/2016   Atopic dermatitis 04/10/2016   Contact dermatitis due to chemicals 04/10/2016   Chronic rhinitis 04/10/2016   Allergic urticaria 04/10/2016   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   Chronic fatigue 02/15/2015   Diarrhea, unspecified 04/19/2014   Abdominal wall pain in periumbilical region 04/19/2014    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   Hypothyroidism (acquired) 04/20/2013   Lactose intolerance 01/27/2013   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   Migraine without aura and without status migrainosus, not intractable 10/01/2012   Mitochondrial disease (HCC) 03/15/2012   Irregular menstrual cycle 09/29/2011   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   Constipation 01/24/2009     Past Medical History:  Diagnosis Date   Allergy to alpha-gal    elevated IgE 04/10/16   Autonomic dysfunction    Chiari I malformation (HCC)    Complication of anesthesia    slow to wake up   Dyspnea    Eczema    GERD (gastroesophageal reflux disease)    Headache    Heart murmur    Hypoglycemia    Hypothyroid    IBS (irritable bowel syndrome)    Joint pain    Mitochondrial disease (HCC)    Muscle pain    Pneumonia    several times   PONV (postoperative nausea and vomiting)     Past medical history comments: See HPI Copied from previous record: Rhylei is a young woman with complicated history including Chairi malformation s/p repair, tethered cord s/p repair, feeding intolerance s/p gastrostomy tube, lactose intolerance and alpha gal allergy, multiple hormonal irregularities including hypothyroidism, chronic headaches, chronic Gi complaints, neuropathic pain in her feet, and multiple other symptoms concerning for mitochondrial diease although genetic testing and muscle biopsy unrevealing.  We also have growing concern for connective tissue disease, however this has not been diagnostically proven either. She now receives fluid infusion via portacath every 2 weeks for energy failure and related to inability to tolerate enteral fluids, and has been much more stable with these infusions.   Surgical history: Past Surgical History:  Procedure Laterality Date   ADENOIDECTOMY     2004or 2005   chiari decompression  2007   GALLBLADDER SURGERY  2015   GASTROSTOMY  TUBE PLACEMENT     x 2   MUSCLE BIOPSY  2010   Port Replacement  05/2018   x 2 orignal 08/2016 then replacement 05/2018   PORT-A-CATH REMOVAL N/A 03/24/2019   Procedure: PORT-A-CATH REMOVAL;  Surgeon: Rodman Pickle, MD;  Location: [facility] OR;  Service: General;  Laterality: N/A;   PORTACATH PLACEMENT Right 03/24/2019   Procedure: PORT-A-CATH INSERTION under ultrasound and fluoroscopic guidance;  Surgeon: Rodman Pickle, MD;  Location: [facility] OR;  Service: General;  Laterality: Right;   REMOVAL OF GASTROSTOMY TUBE N/A 03/24/2019   Procedure: Exchange of Gastrostomy Tube;  Surgeon: Rodman Pickle, MD;  Location: [facility] OR;  Service: General;  Laterality: N/A;   tethered cord  07/2006   TONSILLECTOMY     [id number]     Family history: family history includes Allergic rhinitis in her brother, maternal grandmother, mother, and sister; Asthma in her brother, maternal grandmother, and mother; Diabetes type II in her maternal grandmother; Heart murmur in her mother; Lung cancer in her maternal grandfather; Mitochondrial disorder in her brother; Seizures in her brother.   Social history: Social History   Socioeconomic History   Marital status: Single    Spouse name: Not on file   Number of children: Not on file   Years of education: Not on file   Highest education level: Not on file  Occupational History   Not on file  Tobacco Use   Smoking status: Never Smoker   Smokeless tobacco: Never Used  Vaping Use   Vaping Use: Never used  Substance and Sexual Activity   Alcohol use: No   Drug use: No   Sexual activity: Never    Birth control/protection: None  Other Topics Concern   Not on file  Social History Narrative   Coco graduated from [employer]. Will attend [employer].    She enjoys reading, playing with her cat and her dog, and hanging out with her sister.    Social Determinants of Health   Financial Resource Strain:     Difficulty of Paying Living Expenses: Not on file  Food Insecurity:    Worried About Programme researcher, broadcasting/film/video in the Last Year: Not on file   [employer] of Food in the Last Year: Not on file  Transportation Needs:    Lack of Transportation (Medical): Not on file   Lack of Transportation (Non-Medical): Not on file  Physical Activity:    Days of Exercise per Week: Not on file   Minutes of Exercise per Session: Not on file  Stress:    Feeling of Stress : Not on file  Social Connections:    Frequency of Communication with Friends and Family: Not on file   Frequency of Social Gatherings with Friends and Family: Not on file   Attends Religious Services: Not on file   Active Member of Clubs or Organizations: Not on file   Attends Banker Meetings: Not on file   Marital Status: Not on file  Intimate  Partner Violence:    Fear of Current or Ex-Partner: Not on file   Emotionally Abused: Not on file   Physically Abused: Not on file   Sexually Abused: Not on file   Past/failed meds:  Allergies: Allergies  Allergen Reactions   Other Anaphylaxis    Alpha Gal allergy \ Red meat\ any hoof animal   Latex Rash   Lactated Ringers     Contraindicated w/ mitochondrial syndrome   Propofol Other (See Comments)    Due to possible mitochondrial disorder Due to possible mitochondrial disorder Due to possible mitochondrial disorder   Tape Itching and Other (See Comments)    Immunizations: Immunization History  Administered Date(s) Administered   Influenza,inj,Quad PF,6+ Mos 10/15/2018    Diagnostics/Screenings:  Physical Exam: Ht 5\' 3"  (1.6 m) Comment: pt reported   Wt 120 lb (54.4 kg) Comment: patient reported   BMI 21.26 kg/m   General: Well developed, well nourished young woman, seated at home with her mother, in no evident distress Head: Head normocephalic and atraumatic Neck: Supple Musculoskeletal: No obvious deformities or scoliosis Skin: No rashes or  neurocutaneous lesions  Neurologic Exam Mental Status: Awake and fully alert.  Oriented to place and time.  Recent and remote memory intact.  Attention span, concentration, and fund of knowledge appropriate.  Mood and affect appropriate. Cranial Nerves: Extraocular movements full without nystagmus. Facial sensation intact.  Face and tongue move normally and symmetrically.  Neck flexion and extension normal. Motor: Normal functional bulk, tone and strength Sensory: Intact to touch and temperature in all extremities.  Coordination: Finger-to-nose performed accurately bilaterally. Gait and Station: Arises from chair without difficulty.  Stance is normal. Gait demonstrates normal stride length and balance.   Impression: Problem List Items Addressed This Visit      Neurologic Problems   Chiari I malformation (HCC)   Migraine without aura and without status migrainosus, not intractable   Syringomyelia and syringobulbia (HCC)   Tethered cord (HCC)     Other   Chronic fatigue   Diarrhea, unspecified   Mitochondrial disease (HCC) - Primary   Sensorineural hearing loss (SNHL) of both ears   At risk for dehydration    Other Visit Diagnoses    Hearing loss, unspecified hearing loss type, unspecified laterality       Relevant Orders   Ambulatory referral to ENT   Familial otosclerosis       Relevant Orders   Ambulatory referral to ENT   Irritable bowel syndrome with diarrhea       Relevant Medications   rifaximin (XIFAXAN) 550 MG TABS tablet     Recommendations for plan of care: [facility]. Gerarda Guntherllie has neither had nor required imaging or lab studies since the last visit, other than what has been performed by other providers. She and her mother are aware of those results. I talked with them about the referral for EMG and will call the Diagnostic Neurology lab today. I asked Mom to let me know if she does not hear from that office within one week. I  referred Xiadani to ENT in [facility] for her hearing loss and family history of otosclerosis. I will send in an order for a rolling IV pole for her IV infusions. Finally, I prescribed the Xifaxan as recommended by Dr Penni BombardKendall. I asked Chevonne to let me know if this gives her improvement in diarrhea and abdominal discomfort. Gerarda Guntherllie will return for follow up in 2 months or sooner if needed.  She and her mother agreed with the plans made today.   The medication list was reviewed and reconciled. I reviewed changes that were made in the prescribed medications today. A complete medication list was provided to the patient.  Allergies as of 09/21/2019      Reactions   Other Anaphylaxis   Alpha Gal allergy \ Red meat\ any hoof animal   Latex Rash   Lactated Ringers    Contraindicated w/ mitochondrial syndrome   Propofol Other (See Comments)   Due to possible mitochondrial disorder Due to possible mitochondrial disorder Due to possible mitochondrial disorder   Tape Itching, Other (See Comments)      Medication List       Accurate as of September 21, 2019 11:59 PM. If you have any questions, ask your nurse or doctor.        albuterol 108 (90 Base) MCG/ACT inhaler Commonly known as: Proventil HFA Inhale 2 puffs into the lungs every 4 (four) hours as needed for wheezing or shortness of breath.   ascorbic acid 500 MG tablet Commonly known as: VITAMIN C Take 500 mg by mouth daily.   azelastine 0.1 % nasal spray Commonly known as: ASTELIN Place 2 sprays into both nostrils 2 (two) times daily. Use in each nostril as directed   clobetasol 0.05 % topical foam Commonly known as: OLUX Apply topically 2 (two) times daily. Apply to affected areas twice daily as needed taking care to avoid axillae and groin area. What changed: how much to take   Coenzyme Q-10 200 MG Caps Take 1 capsule by mouth daily.   cromolyn 100 MG/5ML solution Commonly known as: GASTROCROM Take 5 mLs (100 mg total) by mouth 4  (four) times daily -  before meals and at bedtime.   feeding supplement (VITAL 1.5 CAL) Liqd Place 1,000 mLs into feeding tube continuous. bedtime What changed: Another medication with the same name was changed. Make sure you understand how and when to take each.   Ensure Clear Liqd 474 mLs by Gastrostomy Tube route daily. What changed:   when to take this  additional instructions   EPINEPHrine 0.3 mg/0.3 mL Soaj injection Commonly known as: EPI-PEN Inject 0.3 mg into the muscle once. As needed   fludrocortisone 0.1 MG tablet Commonly known as: FLORINEF Take one tablet each morning.   ibuprofen 800 MG tablet Commonly known as: ADVIL Take 1 tablet (800 mg total) by mouth every 8 (eight) hours as needed.   lansoprazole 30 MG capsule Commonly known as: PREVACID TAKE 1 CAPSULE BY MOUTH DAILY   leucovorin 25 MG tablet Commonly known as: WELLCOVORIN Take 25 mg by mouth 2 (two) times daily.   levOCARNitine 1 GM/10ML solution Commonly known as: CARNITOR TAKE 2 TEASPOONFUL ( ) BY MOUTH TWICEDAILY What changed: See the new instructions.   levocetirizine 5 MG tablet Commonly known as: XYZAL TAKE 1 TABLET BY MOUTH EVERY EVENING   levonorgestrel-ethinyl estradiol 0.15-0.03 MG tablet Commonly known as: SEASONALE TAKE 1 TABLET BY MOUTH DAILY   lidocaine 5 % ointment Commonly known as: XYLOCAINE Apply 1 application topically as needed.   metaxalone 400 MG tablet Commonly known as: SKELAXIN Take 1 tablet (400 mg total) by mouth daily. What changed:   when to take this  reasons to take this   ondansetron 4 MG disintegrating tablet Commonly known as: ZOFRAN-ODT Take 1 tablet under the tongue every 6-8 hours as needed for nausea. What changed:   how much to take  how to take this  when to take this  reasons to take this  additional instructions   pregabalin 25 MG capsule Commonly known as: Lyrica Take 1 capsule (25 mg total) by mouth 2 (two) times daily.     pregabalin 75 MG capsule Commonly known as: LYRICA Take 2 capsules (150 mg total) by mouth at bedtime.   PRESCRIPTION MEDICATION Apply 1 application topically as needed (port). Elma- cream   promethazine 25 MG tablet Commonly known as: PHENERGAN Take 1 tablet at onset of migraine. May repeat every 6 hours PRN What changed:   how much to take  how to take this  when to take this  reasons to take this  additional instructions   pyridostigmine 60 MG tablet Commonly known as: MESTINON TAKE 1 TABLET BY MOUTH 3 TIMES DAILY   rifaximin 550 MG Tabs tablet Commonly known as: Xifaxan Take 1 tablet (550 mg total) by mouth 2 (two) times daily for 14 days. Started by: [name], NP   rizatriptan 10 MG tablet Commonly known as: MAXALT Take 1 tablet (10 mg total) by mouth as needed for migraine. May repeat in 2 hours if needed   Synthroid 88 MCG tablet Generic drug: levothyroxine TAKE 1 TABLET BY MOUTH EVERY MORNING ON AN EMPTY STOMACH   topiramate 25 MG tablet Commonly known as: Topamax Take 1 tablet (25 mg total) by mouth at bedtime.   Trokendi XR 50 MG Cp24 Generic drug: Topiramate ER Take 50 mg by mouth daily.      Total time spent with the patient was 30 minutes, of which 50% or more was spent in counseling and coordination of care.  [name] NP-C [facility] Ph. [phone] Fax [phone]

## 2019-09-22 ENCOUNTER — Encounter (INDEPENDENT_AMBULATORY_CARE_PROVIDER_SITE_OTHER): Payer: Self-pay | Admitting: Family

## 2019-09-22 NOTE — Patient Instructions (Signed)
Thank you for meeting with me by video visit today.   Instructions for you until your next appointment are as follows: 1. I have prescribed Xifaxan 550mg  as recommended by Dr . Take 1 tablet twice per day for 14 days.  2. I will call [facility] to ask about the EMG to test for myasthenia gravis. If you do not hear from that office within a week, please let me know.  3. I have referred you to Dr Penni Bombard, an ENT provider in [facility].  4. I will order a rolling IV pole from [facility] 5. Continue your medications and IV fluid infusions as prescribed 6. Please sign up for MyChart if you have not done so 7. Please plan to return for follow up in 2 months or sooner if needed.

## 2019-09-28 ENCOUNTER — Telehealth (INDEPENDENT_AMBULATORY_CARE_PROVIDER_SITE_OTHER): Payer: Self-pay | Admitting: Family

## 2019-09-28 DIAGNOSIS — R638 Other symptoms and signs concerning food and fluid intake: Secondary | ICD-10-CM

## 2019-09-28 DIAGNOSIS — E8840 Mitochondrial metabolism disorder, unspecified: Secondary | ICD-10-CM

## 2019-09-28 DIAGNOSIS — R531 Weakness: Secondary | ICD-10-CM

## 2019-09-28 DIAGNOSIS — R5382 Chronic fatigue, unspecified: Secondary | ICD-10-CM

## 2019-09-28 DIAGNOSIS — Z9189 Other specified personal risk factors, not elsewhere classified: Secondary | ICD-10-CM

## 2019-09-28 DIAGNOSIS — G935 Compression of brain: Secondary | ICD-10-CM

## 2019-09-28 DIAGNOSIS — Z95828 Presence of other vascular implants and grafts: Secondary | ICD-10-CM

## 2019-09-28 DIAGNOSIS — G43009 Migraine without aura, not intractable, without status migrainosus: Secondary | ICD-10-CM

## 2019-09-28 NOTE — Telephone Encounter (Signed)
Fluid hydration will be the first step, so I am glad she is getting IV fluids today.  If possible, give sick day fluids instead of regular.  I also agree with pediatrician reevaluating for sinus infection, it could be remaining frontal sinus infection, even if maxillary sinuses are cleared.   If cleared by pediatrician, I recommend naprosyn q12h, with or without phenergan and bendryl q6 hours for at up to 3 days.    [name] MD MPH

## 2019-09-28 NOTE — Telephone Encounter (Signed)
I received a call from [name] with [facility] regarding Laura Parrish. She said that Mom contacted her about Laura Parrish sleeping excessively, being unable to tolerate much food or liquids, and having a severe headache. Mom reported that Laura Parrish finished Amoxicillin for sinus infection on Saturday 09/24/19 and that this headache began Sunday evening 09/25/19. With the sinus infection, Laura Parrish had pain around her eyes and nose, and did have improvement in symptoms with the Amoxicillin. The headache that began Sunday evening is frontal, close to hairline and constant pounding and intolerance to light. She has nausea with the headache and has been unable to tolerate even g-tube feedings of Pedialyte. She has urinated once today and 3-4 times yesterday. In addition to pain, Laura Parrish has been sleeping at least 20 hours per day since Sunday. Mom took her out this morning to get Covid test and Laura Parrish reported that the headache was worsened by sunlight, noise and motion. Mom has given Laura Parrish all the medications for headache that she has at home. I recommended that she follow up with PCP since the headache began the day after stopping Amoxicillin. She is due for fluid infusion today, which [name] will do. TG

## 2019-09-29 NOTE — Telephone Encounter (Signed)
I talked with Mom about Laura Parrish yesterday afternoon. She was seen by her PCP and Prednisone ordered for migraine. She will receive "sick plan" fluids today. TG

## 2019-11-01 DIAGNOSIS — H9203 Otalgia, bilateral: Secondary | ICD-10-CM | POA: Insufficient documentation

## 2019-11-09 ENCOUNTER — Other Ambulatory Visit (INDEPENDENT_AMBULATORY_CARE_PROVIDER_SITE_OTHER): Payer: Self-pay | Admitting: Pediatrics

## 2019-11-10 ENCOUNTER — Ambulatory Visit (INDEPENDENT_AMBULATORY_CARE_PROVIDER_SITE_OTHER): Payer: Commercial Managed Care - PPO | Admitting: Pediatrics

## 2019-11-10 ENCOUNTER — Ambulatory Visit (INDEPENDENT_AMBULATORY_CARE_PROVIDER_SITE_OTHER): Payer: Commercial Managed Care - PPO

## 2019-11-17 ENCOUNTER — Encounter (INDEPENDENT_AMBULATORY_CARE_PROVIDER_SITE_OTHER): Payer: Self-pay | Admitting: Family

## 2019-11-17 ENCOUNTER — Ambulatory Visit (INDEPENDENT_AMBULATORY_CARE_PROVIDER_SITE_OTHER): Payer: Commercial Managed Care - PPO | Admitting: Dietician

## 2019-11-17 ENCOUNTER — Encounter (INDEPENDENT_AMBULATORY_CARE_PROVIDER_SITE_OTHER): Payer: Self-pay | Admitting: Pediatrics

## 2019-11-17 ENCOUNTER — Other Ambulatory Visit: Payer: Self-pay

## 2019-11-17 ENCOUNTER — Ambulatory Visit (INDEPENDENT_AMBULATORY_CARE_PROVIDER_SITE_OTHER): Payer: Commercial Managed Care - PPO | Admitting: Pediatrics

## 2019-11-17 ENCOUNTER — Ambulatory Visit (INDEPENDENT_AMBULATORY_CARE_PROVIDER_SITE_OTHER): Payer: Commercial Managed Care - PPO

## 2019-11-17 ENCOUNTER — Other Ambulatory Visit (INDEPENDENT_AMBULATORY_CARE_PROVIDER_SITE_OTHER): Payer: Self-pay

## 2019-11-17 VITALS — BP 110/68 | HR 74 | Temp 98.0°F | Ht 64.0 in | Wt 115.8 lb

## 2019-11-17 DIAGNOSIS — N946 Dysmenorrhea, unspecified: Secondary | ICD-10-CM

## 2019-11-17 DIAGNOSIS — E063 Autoimmune thyroiditis: Secondary | ICD-10-CM

## 2019-11-17 DIAGNOSIS — E8840 Mitochondrial metabolism disorder, unspecified: Secondary | ICD-10-CM | POA: Diagnosis not present

## 2019-11-17 DIAGNOSIS — G7000 Myasthenia gravis without (acute) exacerbation: Secondary | ICD-10-CM

## 2019-11-17 DIAGNOSIS — G935 Compression of brain: Secondary | ICD-10-CM

## 2019-11-17 DIAGNOSIS — R5382 Chronic fatigue, unspecified: Secondary | ICD-10-CM

## 2019-11-17 DIAGNOSIS — Z931 Gastrostomy status: Secondary | ICD-10-CM

## 2019-11-17 DIAGNOSIS — Z23 Encounter for immunization: Secondary | ICD-10-CM | POA: Diagnosis not present

## 2019-11-17 DIAGNOSIS — Z95828 Presence of other vascular implants and grafts: Secondary | ICD-10-CM

## 2019-11-17 DIAGNOSIS — Z9189 Other specified personal risk factors, not elsewhere classified: Secondary | ICD-10-CM | POA: Diagnosis not present

## 2019-11-17 DIAGNOSIS — E039 Hypothyroidism, unspecified: Secondary | ICD-10-CM

## 2019-11-17 DIAGNOSIS — G43009 Migraine without aura, not intractable, without status migrainosus: Secondary | ICD-10-CM

## 2019-11-17 MED ORDER — SUMATRIPTAN SUCCINATE 50 MG PO TABS: 50.0000 mg | ORAL_TABLET | ORAL | 5 refills | Status: DC | PRN

## 2019-11-17 MED ORDER — PYRIDOSTIGMINE BROMIDE 60 MG PO TABS
60.0000 mg | ORAL_TABLET | Freq: Four times a day (QID) | ORAL | 5 refills | Status: DC
Start: 2019-11-17 — End: 2020-02-23

## 2019-11-17 NOTE — Progress Notes (Signed)
Patient: Laura Parrish MRN: [id number] Sex: female DOB: 2000/08/01  Provider: [name], MD Location of Care: Pediatric Specialist- Pediatric Complex Care Note type: Routine return visit  History of Present Illness: Referral Source: [name], MD History from: patient and prior records Chief Complaint: Routine follow-up  Laura Parrish is a 19 y.o. 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 Patient was last seen 08/10/19 where Topamax was increased and Lyrica was restarted. Referrals were sent to orthopedics for botox, audiology, neuromuscular specialist and ENT. Since that appointment,  patient has had no ED visits or hospital admissions.   Patient presents today with mother.  Symptom management:  Pulmonary: Pt reports that menstion helpful for breathing. With increased dose she reports that it lasts about 1 hour with each dose.  Denies any side effects such as diarreah.  Breathing improved with steroids.  Weakness: Patient is still experiencing weakness towards the end of the day and also reporting ptosis. Recently patient has been also experiencing eye dryness which she treats with eye drops. Patient did not receive single fiber testing.  Menstrual bleeding: Patient experienced some side pain and has had menstrual bleeding for 2 weeks. Mother believes that she might has an ovarian cyst that burst.   Feeding: Sometimes has to have only ensure clear because she cannot tolerate feeds. Pt reports bloating, nausea and occasional diarrhea. Zofran 2-3 times a week for nausea.  Patient does not like airing out her tube.   GI: Working on finding probiotic as antibiotics for bacterial overgrown made patient sick.  Recommended to start ibgard for GI symptoms but found it ineffective.  Headache: Steroids helped. Once every two weeks with worsening severity. Maxalt no longer works consistently.   Topamax: Causes sleepiness. Will forgo medication if she has something to do in school. Helps with headaches.   Backpain: Patient has also been complaining of worsening back pain   Audiology: Recent hearing test was unremarkable. 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   Equipment needs: Receives two extension sets but is requesting to have at least two more as feeds are staining the tube.   Past Medical History Past Medical History:  Diagnosis Date  . Allergy to alpha-gal    elevated IgE 04/10/16  . Autonomic dysfunction   . Chiari I malformation (HCC)   . Complication of anesthesia    slow to wake up  . Dyspnea   . Eczema   . GERD (gastroesophageal reflux disease)   . Headache   . Heart murmur   . Hypoglycemia   . Hypothyroid   . IBS (irritable bowel syndrome)   . Joint pain   . Mitochondrial disease (HCC)   . Muscle pain   . Pneumonia    several times  . PONV (postoperative nausea and vomiting)     Surgical History Past Surgical History:  Procedure Laterality Date  . ADENOIDECTOMY     2004or 2005  . chiari decompression  2007  . GALLBLADDER SURGERY  2015  . GASTROSTOMY TUBE PLACEMENT     x 2  . MUSCLE BIOPSY  2010  . Port  Replacement  05/2018   x 2 orignal 08/2016 then replacement 05/2018  . PORT-A-CATH REMOVAL N/A 03/24/2019   Procedure: PORT-A-CATH REMOVAL;  Surgeon: Sheliah HatchKinsinger, De BlanchLuke Aaron, MD;  [facility];  Service: General;  Laterality: N/A;  . PORTACATH PLACEMENT Right 03/24/2019   Procedure: PORT-A-CATH INSERTION under ultrasound and fluoroscopic guidance;  Surgeon: Sheliah HatchKinsinger,  De BlanchLuke Aaron, MD;  [facility];  Service: General;  Laterality: Right;  . REMOVAL OF GASTROSTOMY TUBE N/A 03/24/2019   Procedure: Exchange of Gastrostomy Tube;  Surgeon: Rodman PickleKinsinger, Luke Aaron, MD;  Location: [facility] OR;  Service: General;  Laterality: N/A;  . tethered cord  07/2006  . TONSILLECTOMY     [id number]    Family History family history includes Allergic rhinitis in her brother, maternal grandmother, mother, and sister; Asthma in her brother, maternal grandmother, and mother; Diabetes type II in her maternal grandmother; Heart murmur in her mother; Lung cancer in her maternal grandfather; Mitochondrial disorder in her brother; Seizures in her brother.   Social History Social History   Social History Narrative   Laura Parrish graduated from [employer]. Will attend [employer].    She enjoys reading, playing with her cat and her dog, and hanging out with her sister.     Allergies Allergies  Allergen Reactions  . Other Anaphylaxis    Alpha Gal allergy \ Red meat\ any hoof animal  . Latex Rash  . Lactated Ringers     Contraindicated w/ mitochondrial syndrome  . Propofol Other (See Comments)    Due to possible mitochondrial disorder Due to possible mitochondrial disorder Due to possible mitochondrial disorder  . Tape Itching and Other (See Comments)    Medications Current Outpatient Medications on File Prior to Visit  Medication Sig Dispense Refill  . ascorbic acid (VITAMIN C) 500 MG tablet Take 500 mg by mouth daily.     . clobetasol (OLUX) 0.05 % topical foam Apply topically 2 (two) times daily. Apply to affected areas twice daily as needed taking care to avoid axillae and groin area. (Patient not taking: Reported on 11/28/2019) 45 g 3  . Coenzyme Q-10 200 MG CAPS Take 1 capsule by mouth daily.  (Patient not taking: Reported on 11/28/2019)    . EPINEPHrine 0.3 mg/0.3 mL IJ SOAJ injection Inject 0.3 mg into the muscle once. As needed (Patient not taking:  Reported on 11/28/2019)    . lansoprazole (PREVACID) 30 MG capsule TAKE 1 CAPSULE BY MOUTH DAILY 90 capsule 2  . levOCARNitine (CARNITOR) 1 GM/10ML solution TAKE 2 TEASPOONFUL (10ML) BY MOUTH TWICEDAILY (Patient taking differently: Place 1,000 mg into feeding tube daily. ) 118 mL 12  . levonorgestrel-ethinyl estradiol (SEASONALE) 0.15-0.03 MG tablet TAKE 1 TABLET BY MOUTH DAILY (Patient taking differently: Take 1 tablet by mouth daily. ) 1 Package 3  . lidocaine (XYLOCAINE) 5 % ointment Apply 1 application topically as needed. (Patient not taking: Reported on 11/28/2019) 35.44 g 3  . metaxalone (SKELAXIN) 400 MG tablet Take 1 tablet (400 mg total) by mouth daily. (Patient taking differently: Take 400 mg by mouth daily as needed (Muslce pain). ) 30 tablet 6  . Nutritional Supplements (ENSURE CLEAR) LIQD 474 mLs by Gastrostomy Tube route daily. (Patient taking differently: 474 mLs by Gastrostomy Tube route 4 (four) times daily. During the day  By mouth or G tube) 60 Bottle 5  . Nutritional Supplements (FEEDING SUPPLEMENT, VITAL 1.5 CAL,) LIQD Place 1,000 mLs into feeding tube continuous. bedtime (Patient not taking: Reported on 11/28/2019)    .  ondansetron (ZOFRAN-ODT) 4 MG disintegrating tablet Take 1 tablet under the tongue every 6-8 hours as needed for nausea. (Patient not taking: Reported on 11/28/2019) 30 tablet 5  . pregabalin (LYRICA) 25 MG capsule Take 1 capsule (25 mg total) by mouth 2 (two) times daily. 60 capsule 3  . pregabalin (LYRICA) 75 MG capsule Take 2 capsules (150 mg total) by mouth at bedtime. 60 capsule 3  . promethazine (PHENERGAN) 25 MG tablet Take 1 tablet at onset of migraine. May repeat every 6 hours PRN (Patient taking differently: Take 25 mg by mouth every 6 (six) hours as needed for nausea (migraine.). ) 30 tablet 0  . rizatriptan (MAXALT) 10 MG tablet TAKE 1 TABLET BY MOUTH AS NEEDED FOR MIGRAINE. MAY REPEAT IN 2 HOURS IF NEEDED. 12 tablet 3  . SYNTHROID 88 MCG tablet TAKE 1  TABLET BY MOUTH EVERY MORNING ON AN EMPTY STOMACH 90 tablet 1  . topiramate (TOPAMAX) 25 MG tablet TAKE 1 TABLET BY MOUTH EACH NIGHT AT BEDTIME 30 tablet 3  . albuterol (PROVENTIL HFA) 108 (90 Base) MCG/ACT inhaler Inhale 2 puffs into the lungs every 4 (four) hours as needed for wheezing or shortness of breath. (Patient not taking: Reported on 11/17/2019) 6.7 g 2  . azelastine (ASTELIN) 0.1 % nasal spray Place 2 sprays into both nostrils 2 (two) times daily. Use in each nostril as directed (Patient not taking: Reported on 11/17/2019) 30 mL 5  . cromolyn (GASTROCROM) 100 MG/5ML solution Take 5 mLs (100 mg total) by mouth 4 (four) times daily -  before meals and at bedtime. (Patient not taking: Reported on 11/17/2019) 600 mL 12  . PRESCRIPTION MEDICATION Apply 1 application topically as needed (port). Elma- cream      No current facility-administered medications on file prior to visit.   The medication list was reviewed and reconciled. All changes or newly prescribed medications were explained.  A complete medication list was provided to the patient/caregiver.  Physical Exam BP 110/68   Pulse 74   Temp 98 F (36.7 C) (Temporal)   Ht 5\' 4"  (1.626 m)   Wt 115 lb 12.8 oz (52.5 kg)   SpO2 99%   BMI 19.88 kg/m  Weight for age: Facility age limit for growth percentiles is 20 years.  Length for age: Facility age limit for growth percentiles is 20 years. BMI: Body mass index is 19.88 kg/m. No exam data present Gen: well appearing young lady Skin: No rash, No neurocutaneous stigmata. HEENT: Normocephalic, no dysmorphic features, no conjunctival injection, nares patent, mucous membranes moist, oropharynx clear. Resp: normal work of breathing well perfused GI: gtube in place, c/d/i.  Nondistended.  Ext: Not swollen today  Neurological Examination: MS: Awake, alert, interactive. Normal eye contact, answered the questions appropriately for age, speech was fluent,  Normal comprehension.   Attention and concentration were normal. Cranial Nerves: EOM normal, no nystagmus; no ptsosis, face symmetric with full strength of facial muscles, hearing grossly intact.  Motor/Coordination- At least antigravity in all muscle groups. No abnormal movements. No dysmetria on extension of arms bilaterally.  No difficulty with balance or strength when squatting and standing.  Gait: Normal gait. Tandem gait was normal. Was able to perform toe walking and heel walking without difficulty   Diagnosis:  1. Myasthenia gravis (HCC)   2. Chiari I malformation (HCC)   3. Mitochondrial disease (HCC)   4. At risk for dehydration   5. Port-A-Cath in place   6. Migraine without aura and without  status migrainosus, not intractable   7. Chronic fatigue   8. Gastrostomy status (HCC)   9. Dysmenorrhea      Assessment and Plan AXIE HAYNE is a 19 y.o. female with history of Chairi malformation s/p repair, tethered cord s/p repair, feeding intolerance s/p gastrostomy tube, lactose intolerance and alpha gal allergy, multiple hormonal irregularities including hypothyroidism, chronic headaches, chronicGIcomplaints, neuropathic pain in her feet, and symptoms concerning for mitochondrial diease although genetic testing and muscle biopsy unrevealing who presents for follow-up in the pediatric complex care clinic.Patient is doing well. As patient is not experiencing negative side effects on mestinon and she reports that she is breathing better on the medication I suggested we increase to 4 times daily. Patient agreed.  Reviewed patient's growth chart and she remains at an appropriate BMI. There is concern that patient is not receiving enough nutrients as she is not always able to tolerate her feeds. Dietitian was able to evaluate during this appointment. Recommended that patient go to all ensure clear to help with feeding intolerance. Patient has still not received single fiber ENG. Will send referral to [facility] . In regards  to headache I suggested we change abortive medication as Maxalt is no longer effective. Discussed options for TMJ such as a mouth guard at night. Patient seen by case manager, dietician, integrated behavioral health today as well, please see accompanying notes.  I discussed case with all involved parties for coordination of care and recommend patient follow their instructions as below.   Symptom management:  Increase mestinon to 60mg  4 times daily Change Maxalt to Imitrex Labwork drawn, I will look at results Consider CGRP inhibitor as next headache medication, I will look into current medication and problem list to make sure they do not interact. New referral sent to [facility] for single fiber EMG to evaluate potential myasthenia.  Patient had previous evaluation as a child that was negative, however symptoms have now started that are consistent with disorder.  Autoimmune testing not yet sent, awaiting neuromuscular neurology impression of myasthenia symptoms with likely mitochondrial disease.  Regarding hormones, labwork sent today, will discuss with [facility] vs GYN.   The CARE PLAN for reviewed and revised to represent the changes above.  This is available in Epic under snapshot, and a physical binder provided to the patient, that can be used for anyone providing care for the patient.    I spend 43 minutes on day of service on this patient including discussion with patient and family, coordination with other providers, and review of chart  Return in about 3 months (around 02/17/2020).   04/16/2020 MD MPH Neurology,  Neurodevelopment and Neuropalliative care [facility] Child Neurology  [address] [place], [place], [place] Phone: [phone]  By signing below, I, [phone] attest that this documentation has been prepared under the direction of Denyce Robert, MD.    I, [name], MD personally performed the services described in this  documentation. All medical record entries made by the scribe were at my direction. I have reviewed the chart and agree that the record reflects my personal performance and is accurate and complete Electronically signed by [name] and Denyce Robert, MD 12/07/19 10:47 AM

## 2019-11-17 NOTE — Patient Instructions (Signed)
Increase mestinon to 60mg  4 times daily Change Maxalt to Imitrex Labwork drawn, I will look at results Consider CGRP inhibitor as next headache medication, I will get back to you about this.

## 2019-11-17 NOTE — Progress Notes (Signed)
   Medical Nutrition Therapy - Progress Note Appt start time: 10:30 AM Appt end time: 11:00 AM Reason for referral: Gtube dependence  Referring provider: Dr. Artis Flock - [facility] DME: [facility] Pertinent medical hx: Chiari I malformation, syringomyelia, mitochondrial disease, iron-deficiency anemia, hypoglycemia, chronic diarrhea, chronic dehydration, +G-tube, +port (receives weekly infusions),  Assessment: Food allergies: red meat (can consume poultry), lactose intolerance Pertinent Medications: see medication list Vitamins/Supplements: MVI + iron, Vitamin B2, Co-Q10, Calcium, Plexus probiotic Pertinent Labs: (7/12) Hgb A1c: 4.8 WNL  (11/4) Anthropometrics: Ht: 162.6 cm Wt: 52.5 kg BMI: 19.8   (1/28) Anthropometrics: The child was weighed, measured, and plotted on the CDC growth chart. Ht: 163 cm (48 %)  Z-score: -0.05 Wt: 55.2 kg (37 %)  Z-score: -0.32 BMI: 20.7 (38 %)  Z-score: -0.30  (11/6) Wt: 54.7 kg (7/30) Wt: 54.1 kg (2/27) Wt: 50.1 kg (9/30) Wt: 52.6 kg (6/13): Wt: 52.2 kg   Estimated minimum caloric needs: 2000 kcal/kg/day (EER) Estimated minimum protein needs: 0.85 g/kg/day (DRI) Estimated minimum fluid needs: 2000 mL/kcal (1 mL/kcal)  Primary concerns today: Follow-up for Gtube dependence. Mom and brother (pt [facility]) accompanied pt to appt today.  Dietary Intake Hx: Usual feeding regimen: 2-3 cans Vital Peptide 1.5 + 240 mL water OR 3 Ensure Clear + 240 mL water @ 85 mL/hr for 10 hours (8-9:30 PM - 7:30-10 AM) 24-Hr Recall: Breakfast: pecan pie oatmeal Lunch: 1/2 large croissant + chicken salad Dinner: limited - 1/2 alfredo rolls ups Snacks: limited Beverages: Good Belly smoothie, sparking water, tea, water, 1-3 Ensure clear PO  GI: continued diarrhea, worse with Vital Peptide formula  Physical Activity: normal ADL  Unable to determine estimated intake given fluctuating regimen. Pt likely not meeting needs given weight loss.  Nutrition Diagnosis: (7/30)  Inadequate oral intake related to suspected mitochondrial disorder causing decreased appetite and poor PO feeding as evidence by G-tube dependent to meet caloric, protein, and fluid needs.  Intervention: Discussed current regimen and barriers. Pt reports discomfort with Vital Peptide formula now that results in her stopping the feed and having diarrhea. Pt has switched to using Ensure Clear overnight without issues, family would like prescription for more Ensure Clear so they can d/c the Vital Peptide, RD in agreement. RD to send MyChart message with new feeding plan. All questions answered, family in agreement with plan. Recommendations: - Switch to Ensure Clear via tube and by mouth and stop Vital Peptide. - Goal for 3 cans overnight + 120 mL (1/2 can) of water. - Continue rate of 85 mL/hr x 10-11 hours until done. - I'll send a new prescription into [facility] stating a variety of flavors. - Let me know if you get tired of the Ensure Clear as we could switch to [facility] which has different flavors. Same product, just different brand. - Call me if you have any questions or concerns.  Teach back method used.  Monitoring/Evaluation: Goals to Monitor: - Weight trends - TF tolerance  Follow-up in 6 months or sooner if needed.  Total time spent in counseling: 30 minutes.

## 2019-11-17 NOTE — Progress Notes (Signed)
Critical for Continuity of Care - Do Not Delete                              Shunta M. Cantin  DOB- May 24, 2000  Brief History:  Laura Parrish is a young woman with complicated history including Chairi malformation s/p repair, tethered cord s/p repair, feeding intolerance s/p gastrostomy tube, lactose intolerance and alpha gal allergy,  multiple hormonal irregularities including hypothyroidism, chronic headaches, chronic Gi complaints, neuropathic pain in her feet,  and multiple other symptoms concerning for mitochondrial diease although genetic testing and muscle biopsy unrevealing. We also have growing concern for connective tissue disease, however this has not been diagnostically proven either. She now receives fluid infusion via portacath every 2 weeks for energy failure and related to inability to tolerate enteral fluids, and has been much more stable with these infusions.    Baseline Function: . Cognitive -age appropriate responses to questions, attention and comprehension appropriate . Neurologic - cognitively normal, has chronic fatigue, has intermittent headaches, chronic leg pain . Endocrine - has hypothyroidism . Communication -fluent speech . Cardiovascular -normal- port a cath in place to receive medications and fluids as needed (05/27/2018) . Vision -normal, however has regular screening . Hearing -normal . Pulmonary - history of asthma . GI - Feeding intolerance with variable diarrhea and constipation, has a feeding tube in place . Urinary -normal . Motor -normal function, although low endurance.   Guardians/Caregivers: Mikaella Escalona (mother) ph [phone] Fatimah Sundquist (father) ph [phone]  Recent Events: Port a Cath replacement 05/27/2018 at [facility]: starting IBGard should the patient have any feeding tube problems, we may confer with my colleague, Dr. Ralph Dowdy  Care Needs/Upcoming Plan:  CAP-C ends age 21. Going to [employer]  11/28/2019- 8:45 AM Dr.  Fransico Michael  12/01/2019 8:45 AM Verdene Lennert  Feeding: DME: [employer]-fax [phone] Formula: Ensure Clear 3 cans  Current regimen:  Day feeds: PO foods Overnight feeds: 3 cans overnight + 120 ml of water (1/2)  85                                             mL/hr x 10-11 hours  FWF:   Notes: Pt receives oral foods during the day, but relies of enteral                         nutrition overnight to meet nutritional needs. Supplements: MVI + iron, Vitamin B2, Co-Q10, and Calcium Goal to add Ensure clear, and take 32oz water in the few days leading up to infusions  Symptom management/Treatments:  Neurological - Topiramate for migraine prevention, Maxalt, Ondansetron and Ibuprofen for rescue; Lyrica for neuropathic pain in her legs; Skelaxin for body pain; Fluid infusions every 2 weeks; needs frequent rest periods, IV fluids with D10 1/2 NS at 125 ml/hr x 24 hrs or 250 ml/hr x 12 hrs.   Endocrine - on Synthroid for hypothyroidism  Pulmonary - Astelin nasal spray, Xyzal  GI - Cromolyn, Ensure supplements, Miralax for constipation  Musculoskeletal- Mestinon for weakness  TMJ- seeing ortho and dentist  Past/failed meds:  Periactin caused severe fatigue and failed to control headaches  Gabapentin less helpful for neuropathic pain  Zomig - ineffective  Maxalt - intermittently helpful  Skelaxin helpful at times but causes sleepiness  Providers:  Verdene Lennert, MD (PCP) [facility] Internal Medicine- ph. [phone] Fax [phone]  [name], MD [facility] Child Neurology and Pediatric Complex Care) ph [phone] fax [phone]  Annabelle Harman, RD [facility] Pediatric Complex Care dietitian) ph [phone] fax [phone]  [name] NP-C [facility] Pediatric Complex Care) ph [phone] fax [phone]  [name] Essex, MD [facility] Cardiology) ph [phone] fax [phone]  [facility] Surgery-ph. [phone] fax port and G  tube  Truitt Merle, MD [facility] Gastroenterology) ph. [phone] [phone]  Dennie Fetters MD([facility]) ph. [phone] fax [phone]   Community support/services:  [facility] -[phone] fax [phone] home health nursing by [name]; portacath and infusion supplies  CAP-C thorugh [facility] at [facility] [phone] Mother is CAP-C aid.    [employer]  Equipment:  [facility]: [phone] fax: [phone]-  G-tube supplies, Port A Cath supplies, CADD pump for IV fluids, Feeding pump, Pulse ox, IV fluids and Pediasure  [employer]: ph. [phone] fax: [phone] wheelchair, hospital bed  Goals of care: Attempting all typical milestones of adulthood, with modifications for activity level  Advanced care planning: Full Code  Psychosocial: Lives with parents and 2 siblings, has driver's license, attends community college part time  Diagnostics/Screenings:  Radiology - Brain MRI 4/08: Sequelae of suboccipital decompression for Chiari I malformation with normal CSF flow. 2. Cervical spine syrinx, more completely evaluated on dedicated MR of the cervical spine, reported separately. Repeat 7/09: unchanged CSpine MRI 4/08: syrinx is again seen spanning the C5 to T1 levels . Repeat 1/10: unchanged. EMG/NCV Normal study.  11/22/09 SSEP These lower extremity somatosensory evoked potentials are at least borderline abnormal for an increased absolute latency of the P37 responses and the L1 - P37 interpeak latencies with both left and right posterior tibial nerve stimulation. NCV/EMGs 2008: Borderline study: no electrodiagnostic evidence of sensory or motor neuropathy. Abnormal peroneal nerve responses & F-wave abnormalities are of unclear significance but could represent mild peroneal mononeuropathies of the branch to the EDB muscle bilaterally.  Muscle bx 2009: Skeletal muscle, right leg, biopsy Mild  variation in fiber size, more notable in Type 1 fibers with rare atrophic Type 1 fibers EM: The normal sarcomeric architecture is preserved. Mitochondria are morphologically unremarkable. No abnormal accumulations of glycogen or lipid are seen. There are scattered mildly dilated structures between fibrils which may represent sarcotubular profiles EEG: normal awake and asleep (2010) PSG: normal sleep.  06/16/2019- Field Vision Test- normal, Normal inner and outer retina OU  06/23/2019 EMG test Brenners-I Diminished fibular motor responses possibly due to hx of tethered cord, chronic bracing and is a nonspecific finding but no evidence of myopathy or generalized neuropathy.   [name] NP-C and [name], MD Pediatric Complex Care Program Ph: [phone] Fax: [phone]

## 2019-11-18 ENCOUNTER — Encounter (INDEPENDENT_AMBULATORY_CARE_PROVIDER_SITE_OTHER): Payer: Self-pay | Admitting: Dietician

## 2019-11-18 LAB — CBC WITH DIFFERENTIAL/PLATELET
Absolute Monocytes: 367 cells/uL (ref 200–950)
Basophils Absolute: 48 cells/uL (ref 0–200)
Basophils Relative: 0.7 %
Eosinophils Absolute: 286 cells/uL (ref 15–500)
Eosinophils Relative: 4.2 %
HCT: 45 % (ref 35.0–45.0)
Hemoglobin: 14.7 g/dL (ref 11.7–15.5)
Lymphs Abs: 2645 cells/uL (ref 850–3900)
MCH: 28.9 pg (ref 27.0–33.0)
MCHC: 32.7 g/dL (ref 32.0–36.0)
MCV: 88.4 fL (ref 80.0–100.0)
MPV: 10.9 fL (ref 7.5–12.5)
Monocytes Relative: 5.4 %
Neutro Abs: 3454 cells/uL (ref 1500–7800)
Neutrophils Relative %: 50.8 %
Platelets: 364 10*3/uL (ref 140–400)
RBC: 5.09 10*6/uL (ref 3.80–5.10)
RDW: 12 % (ref 11.0–15.0)
Total Lymphocyte: 38.9 %
WBC: 6.8 10*3/uL (ref 3.8–10.8)

## 2019-11-18 LAB — T4, FREE: Free T4: 1.1 ng/dL (ref 0.8–1.4)

## 2019-11-18 LAB — FSH/LH
FSH: 0.7 m[IU]/mL — ABNORMAL LOW
LH: <0.2 m[IU]/mL — ABNORMAL LOW

## 2019-11-18 LAB — T3, FREE: T3, Free: 2.9 pg/mL — ABNORMAL LOW (ref 3.0–4.7)

## 2019-11-18 LAB — TSH: TSH: 1.21 mIU/L

## 2019-11-18 LAB — IRON: Iron: 69 ug/dL (ref 40–190)

## 2019-11-18 NOTE — Patient Instructions (Signed)
-   Switch to Ensure Clear via tube and by mouth and stop Vital Peptide. - Goal for 3 cans overnight + 120 mL (1/2 can) of water. - Continue rate of 85 mL/hr x 10-11 hours until done. - I'll send a new prescription into [employer] stating a variety of flavors. - Let me know if you get tired of the Ensure Clear as we could switch to [facility] which has different flavors. Same product, just different brand. - Call me if you have any questions or concerns.

## 2019-11-28 ENCOUNTER — Encounter (INDEPENDENT_AMBULATORY_CARE_PROVIDER_SITE_OTHER): Payer: Self-pay | Admitting: "Endocrinology

## 2019-11-28 ENCOUNTER — Other Ambulatory Visit: Payer: Self-pay

## 2019-11-28 ENCOUNTER — Encounter (INDEPENDENT_AMBULATORY_CARE_PROVIDER_SITE_OTHER): Payer: Self-pay

## 2019-11-28 ENCOUNTER — Telehealth (INDEPENDENT_AMBULATORY_CARE_PROVIDER_SITE_OTHER): Payer: Self-pay

## 2019-11-28 ENCOUNTER — Ambulatory Visit (INDEPENDENT_AMBULATORY_CARE_PROVIDER_SITE_OTHER): Payer: Commercial Managed Care - PPO | Admitting: "Endocrinology

## 2019-11-28 VITALS — BP 100/60 | HR 84 | Wt 116.8 lb

## 2019-11-28 DIAGNOSIS — E162 Hypoglycemia, unspecified: Secondary | ICD-10-CM

## 2019-11-28 DIAGNOSIS — I951 Orthostatic hypotension: Secondary | ICD-10-CM | POA: Diagnosis not present

## 2019-11-28 DIAGNOSIS — R5382 Chronic fatigue, unspecified: Secondary | ICD-10-CM

## 2019-11-28 DIAGNOSIS — E049 Nontoxic goiter, unspecified: Secondary | ICD-10-CM

## 2019-11-28 DIAGNOSIS — R231 Pallor: Secondary | ICD-10-CM

## 2019-11-28 DIAGNOSIS — N912 Amenorrhea, unspecified: Secondary | ICD-10-CM

## 2019-11-28 DIAGNOSIS — E063 Autoimmune thyroiditis: Secondary | ICD-10-CM | POA: Diagnosis not present

## 2019-11-28 DIAGNOSIS — R899 Unspecified abnormal finding in specimens from other organs, systems and tissues: Secondary | ICD-10-CM

## 2019-11-28 DIAGNOSIS — Z793 Long term (current) use of hormonal contraceptives: Secondary | ICD-10-CM

## 2019-11-28 DIAGNOSIS — R21 Rash and other nonspecific skin eruption: Secondary | ICD-10-CM

## 2019-11-28 DIAGNOSIS — N939 Abnormal uterine and vaginal bleeding, unspecified: Secondary | ICD-10-CM

## 2019-11-28 DIAGNOSIS — N946 Dysmenorrhea, unspecified: Secondary | ICD-10-CM

## 2019-11-28 NOTE — Telephone Encounter (Signed)
Per Dr. Artis Flock the Gyn she recommends for patient is Dr. Dalbert Garnet at [facility] in [place]. RN will fax referral one Dr. Artis Flock and Dr. Juluis Mire notes are complete.

## 2019-11-28 NOTE — Telephone Encounter (Signed)
Call to couple of [facility] offices but unable to see new patient today. Family lives in [place], [place] called [facility] they agreed can see her tomorrow morning or on Wed RN advised will confirm that works with mom and send referral. She reports once they receive the referral they will contact family to schedule. Message to CMA with Dr. Fransico Michael- patient is being seen currently- requested she confirm with mom it is ok to send referral. Mom agrees send to [facility]. Fax- [phone].  Referral faxed through Epic advised mom to call their office when she gets home if they have not contacted her to schedule the appt. Dr. Fransico Michael wants her to see a GYN physician to manage her abnormal periods and hormone levels. Mom reports Dr. Artis Flock had suggested someone. RN sent message to Dr. Artis Flock to determine where to send the Referral.

## 2019-11-28 NOTE — Patient Instructions (Signed)
Follow up in 4 months. Please repeat lab tests 1-2 weeks prior.  

## 2019-11-28 NOTE — Progress Notes (Signed)
Subjective:  Subjective  Patient Name: Shawn Dannenberg Date of Birth: Dec 10, 2000  MRN: [id number]  [name]  presents for her clinic visit today for follow up evaluation and management of her acquired hypothyroidism due to Hashimoto's thyroiditis and many other health problems.   HISTORY OF PRESENT ILLNESS:   [name] is a 19 y.o. Caucasian young lady.   [name] was accompanied by her mother  1. [name]'s initial pediatric endocrine clinic visit here at [facility] occurred on 01/29/18. The following information is a composite of the history gained at her initial visit, information gained after 7 hours of reviewing her records in [place], and information gleaned on 02/05/18:   A. [name] has had a very complicated medical and surgical history. She has been hospitalized many times during her life. Her problem list includes the following:    1). Chronic GI problems, with frequent constipation, frequent diarrhea, BMs occurring immediately after eating, nausea, abdominal pains, and feeding difficulties requiring the use of both oral and nocturnal G-tube feedings: Previous EGD and colonoscopy have been unremarkable. TTG IgA was negative on 12/29/17.    2). Chronic allergies, including seasonal allergies, allergies to other environmental agents, latex, Propofol, fish and shellfish according to rashes but not to testing, and Alpha-Gal allergy with a positive result, but not specifically beef, pork, or lamb;   3). Chronic fatigue   4). Iron deficiency anemia   5). Hypothyroidism with elevated TPO and thyroglobulin antibodies, c/w Hashimoto's thyroiditis;       6). Muscle weakness and myalgias   7). 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 Family was offered treatment with Florinef, but declined because their pharmacist advised against starting Florinef due to possible adverse effects.     8). Intermittent dehydration requiring  administration of iv fluids via her port-a-cath every 6 weeks, but more recently every 2 weeks   9). Chronic headaches, to include migraines             10). 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 Chronic swelling of her hands and feet   13). Chronic pain of her back, joints, legs, and feet   14). Skin rashes, hives, eczema, and flushing   15). Irregular menses and menorrhagia   16). Nocturnal hypoglycemia if she does not have nocturnal pump feedings.    17). Chronic illness with low grade fevers, sometimes up to 101, but mostly 99-100 degrees   18). 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 However, her testing did not support that hypothesis. Mom has the same genetic variant, but not all of the same problems.   B. Perinatal history: Gestational Age: [redacted]w[redacted]d; 5 lb 7 oz (2.466 kg); Healthy newborn, except noted to have a right club foot deformity.    C. Infancy: She had to be hospitalized several times for unexplained dehydration. She had club foot surgery at about 36 weeks of age.   D. Childhood:   1). She was sick a lot.    2). She had PT for her club foot.    3). A Chiari malformation was discovered at age 65. She had decompression surgery in 2007.  4). In 2008 she was discovered at [name] to have a tethered cord. She had a release procedure in 2008.   5). In 2010 she had a muscle BX that showed mild variation in fiber size, more notable in Type I fibers with rare atrophic Type I fibers. EM showed that the normal sarcomeric architecture was preserved. Mitochondria were morphologically unremarkable. Dr. 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    6). In 2010 she continued to have severe problems with lack of appetite, nausea, and feeding difficulties. In late 2010 she had a G-tube placed.    7). Genetic testing was performed at [facility] on 09/02/08. Karyotype and chromosomal array were normal. 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 Mitochondrial DNA copy number in muscle tissue was normal, 123% of the mean value of the age and tissue tested controls.   8). On 10/18/12 [name]'s TFTs were c/w primary hypothyroidism. Levothyroxine treatment was initiated.    9). She had a cholecystectomy in June 2015 for biliary hypokinesia.    10). A port-a-cath was placed in August 2018.    11). In December 2018 she saw a geneticist, Dr. Penni Bombard, in [place] for evaluation of a mitochondrial dysfunction. 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 Targeted GeneDX testing was performed in June-July 2019 and reportedly showed allergies in both Holton and her brother. Testing in Gerlean's brother also showed genetic tendencies for seizures and developmental delays.     E. Chief complaint:   1). Hypothyroidism was diagnosed on 18 October 2012 after several years of variable thyroid hormone test results. Her TSH was high 10.35 (ref 0.5-4.5) and free T4 was low at 0.67 (ref 0.80-2.0). TPO antibody and thyroglobulin antibody were both reportedly positive. She started thyroid hormone at about that time. She takes generic levothyroxine, 50 mcg/day. That dose has not been changed since it was started. She does not think that she has ever had thyroid swelling or pain. Mom said that Diamonds's TSH and free T4 values have been normal. However, her TSH was elevated again in February 2019.   2). Hypoglycemia: At about age 19 she had low BGs almost all the time. If she did not get G-tube feedings at night, the BGs in the mornings  could be in the 40s-50s. She had never been evaluated for adrenal insufficiency. If her BGs are low during the day she can get dizzy.   3). Dizziness: She has been dizzy for many years. The dizziness was originally ascribed to her Arnold-Chiari malformation, but did not improve after surgery. About 30% of her dizziness was associated with low BGs. Some of her dizziness was a spinning dizziness.  Some of her dizziness appears to be orthostatic dizziness. This latter dizziness occurs almost every morning when she sits up from a supine or prone position. The dizziness can also occur if she stands up too fast from a seated position. Whenever she gets dizzy for any reason, she often gets black spots in front of her eyes and feels like she will pass out.   4). Abnormal periods and excessive menstrual and intermenstrual bleeding: Menarche occurred at age 22. Periods have never been regular. When she took most OCPs she had persistent vaginal bleeding. Seasonale caused her to have menses every 3 months for about one year, then it became less effective. She continued to have heavy spotting. This problem has been sometimes attributed to a  connective tissue disorder.    5). 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 Her initial [facility] consultation occurred on 03/15/12.      1). The report of her 2009 muscle BX was reviewed. The genetics reports from [facility] done in July 2010 and from [place] in 2010 were reviewed. Also reviewed was a report from Dr. Salomon Fick of the [facility] who did not endorse support for a diagnosis of mitochondrial disease.      2). [facility] geneticist noted that none of Joycelin's previous testing supported a diagnosis of a mitochondrial disorder. The geneticist also stated that no specific genetic disorders are known to cause her constellation of symptoms shared by her and her younger brother, including Chiari I malformation. Genome sequencing testing,  to determine if River and her brother had a unique gene mutation underlying their presentations, was offered to the family. Unfortunately the costs of that testing were too high for the family to agree to the testing     B). Her [facility] record note from 10/15/15 shows that Takhia's previous tests for myopathy and mitochondrial function were all negative. Genetic whole exome testing was negative for a 19 y.o. female with Chiari I malformation, fatigue, muscle weakness, feeding difficulties, tethered cord, and clinical diagnosis of Ehlers-Danlos syndrome. GeneDX testing showed a variant in the mitochondrial genome of uncertain significance that was found in 200% of Anays's mitochondria and 100% of her mother's mitochondria. Brother presumably had the same variant. It was noted that since Aiea and Braxton inherited the same change as the mother and at the same levels, it was unlikely that this variant was causing Lilybeth's and Braxton's symptoms, because the mother did not exhibit similar symptoms. [Addendum 02/05/18: Mom says that she did have some of the same symptoms that her children had, but the geneticist might not have realized that mom did have some of the same symptoms. Mom did not have as many symptoms as Shelbie and Braxton had.] Multiple rounds of genetic testing including WES and mitochondrial genome sequencing, were not suggestive of mitochondrial disorder or any other genetic diseases. The family was offered the option of applying for further testing in the [facility]. The family applied for that program, but were not accepted due to the fact that the program wanted patients with different issues.     C). When Zivah saw Dr Penni Bombard, a geneticist in [place], he confirmed that she does have a mitochondrial disorder and also has Ehlers-Danlos syndrome.     6). Feeling unwell, fatigue, fevers, headaches, nasal and head congestion, lack of appetite: These problems vary from one part of  the day to another. She has at least one of them every day.     A). Fatigue: She does not feel tired when she awakens, but does begin to feel tired about 2 PM. If she naps for an hour or two, the fatigue improves for several hours, but then recurs around 6 PM. If she does not nap, she remains tired and develops a bitemporal headache about 4-5 PM. She occasionally has a problem with insomnia, but not with early awakening. Sleeping in on weekends does not improve her fatigue.      B). Fevers: When she feels bad she usually has fevers. Most of her fevers are in the 98.8-99.2 range. These "fevers" occur about 5-7 PM.     C). Nasal and head congestion: She has had a stuffed up nose for 4-5 months.  When she has nasal congestion she also sometimes has head congestion. However, she does not have head congestion without having nasal congestion.      D). Headaches: She often has nuchal headaches when her trapezius muscles are sore/stiff. Sometimes these nuchal headache extend to the vertex. She also has bitemporal headaches when she gets tired.   F. Pertinent family history:   1). Stature and puberty: Mom is 5-1. Dad is 5-5. Mom had menarche at age 20. Mom has had irregular menses prior to her first pregnancy. She was diagnosed with endometriosis after her first pregnancy. After treatment for endometriosis, presumably GnRH analogues for 6 months, her menses were normal.     2). Obesity: Maternal grandmother   3). DM: Maternal grandmother has T2DM. A maternal grand uncle had DM, possibly type 1.    4). Thyroid: 20 y.o. sister has had a few abnormal thyroid hormone levels. Paternal grandmother is hypothyroid, without having had thyroid surgery, irradiation. or been on a prolonged low iodine diet.    5). ASCVD: Mother has a heart murmur.    6). Cancers: paternal grandfather dies of lung CA. There was also leukemia in a distant paternal relative.    7). Others: Older sister has had oligomenorrhea frequently. Mother  has migraines. Younger brother has worse mitochondrial dysfunction and seizures. Maternal grandmother has osteoarthritis and fibromyalgia.  Maternal grandmother takes B12 shots for anemia. Maternal grandmother also has hypertension.  One maternal uncle had cardiomyopathy. Maternal grand uncle had his first CVA at age 20. A distant paternal second cousin and a paternal first cousin had lupus.]  G. Lifestyle:   1). Raphaela's diet: She can eat food normally and typically eats 3 small meals per day. She estimates that about 40% of her calories comes from oral intake. She does not eat much at any one time. She is not a "foodie" by nature and is a very picky eater.  She has G-tube feedings every night: 2 cans of Vital Peptide, 1.5 calories per mL, continuous feeding, from 9:30 PM to 7:30 AM via a pump. She does not usually take fluids by the G-tube during the day, but may do so during the Summer months to prevent dehydration.    2). She receives iv fluid infusions by her port-a-cath every other week: D5 1/2 NS, three 1000 ml bags over 12 hours. If she gets sick or gets dehydrated, she gets 3000 ml of D10NS iv over 12 hour. She receives substantial home health nursing support from [facility].     3). Physical activities: She does not walk much for exercise because walking causes her feet to swell. She does not go outside much during the Winter months. She was told by one doctor that she had angioedema.    4). Medications:    A). Morning medications: CoQ10, vitamin E, vitamin C. Prevacid, and allergy medications. Breakfast is usually a fruit smoothy.     B). Bedtime: LT4, Topamax, Lyrica, Seasonale,  2. Clinical course:  A. In May 2020 she had a central venous catheter access device inserted. The catheter is working well.  Crissie FiguresB. Jakyrah saw Dr. Damita LackStoudemire on 11/19/18. Her PFTs were essentially normal. The MIP/MEPs were better, but still low. Her sleep study was normal. He put her on Mestinon three times daily.  The Mestinon helped for several hours.   C. She also saw Dr. Artis FlockWolfe on 11/19/18. Dr. Artis FlockWolfe [facility]   Gerarda Gunther. Kanyah has a long history of  developing physical symptoms that she attributes to adverse reactions to medications and then stopping those medications without discussing the issues with the physicians that prescribed the medications. I wonder if some of those reactions were really due to the medications involved. She also has a history of not always following up with specialists that she has seen if what they prescribed she felt was ineffective or might have caused adverse reactions .   E. I tried to start her on fludrocortisone in January 2021, but she stopped it after about 3 weeks because it made her feel bad.   F. On 03/04/19 Dr. Artis Flock had a video visit with Aliani and increased her mestinon to 120 mg, three times daily. Sanna feels that the mestinon is helping "a little bit".   G. On 03/06/19 she saw a GI specialist at [facility]. She was treated with IBGard as needed. She did not feel that the IBGard helped very much and did not contact the specialist for follow up.   H. On 03/24/19 Mckinna's G-tube and port-a-cath were replaced.    I. On 05/11/19 she was seen in ortho. A diagnosis of bilateral shoulder subluxations was made, presumably due to Ehlers-Danlos syndrome. She is in PT now.   J. On 06/23/19 she had a NCV/EMG study at [facility]. Family does not know the results.   3. Chesnee's last [facility] clinic visit occurred on 07/25/19. I continued her on Synthroid 88 mcg/day.   A. In the interim she has been fairly healthy, but about 3 weeks ago began having a lot of back pain for several days and then had vaginal bleeding that has persisted. She is still taking Seasonale OCPs daily. She does not have a regular OB/GYN provider  B. She still has had temperatures in the 99.4-100.0 in the evenings, but normalize the next day.  She is still pretty tired. She is having  alternating sensations of being hot and cold. She thinks her headaches worse, in that they are more severe. She still has orthostatic dizziness.   C.  She had a second NCV at [facility], but that was not the study requested by Dr. Artis Flock, so she is due for a third NCV study.    D. Her breathing difficulties have remained about the same. Dr. Damita Lack feels that she may have muscle weakness. He has referred her back to Dr. Artis Flock.   E. Bryla is still receiving feedings orally and also via her G-tube.   F. She still takes 88 mcg/day of Synthroid. She still has weekly 12-hour iv infusions of D5. She feels better for about a week after the infusions.  G. She saw ENT in October. 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 The ENT suggested that she might have TMJ.   H. The eczema around her eyes has flared up. Peds Neuro referred her to dermatology in [place].   4. Pertinent Review of Systems:  Constitutional: Kelyn feels "tired". She is colder than others all of the time. When she was out in the heat during the Summer, she sometimes sweated, but has not sweated since then. Headaches are somewhat worse.  Eyes: As above. Vision is "not great". She saw a neuroophthalmologist on 06/16/19. The possibility of optic neuropathy, similar to her brother, was raised. Her glasses help a little bit.  There are no other significant eye complaints. Neck: The patient has continued to have occasional complaints of anterior neck soreness and difficulty swallowing.  Heart: Her heart rate is usually in the 60-70s, but  when she is tired the heart rate can be in the 50s-60s. The patient has no complaints of palpitations, irregular heat beats, chest pain, or chest pressure. Gastrointestinal: As above. She has frequent nausea in the middle of the night. The nausea can last for up to three hours if she does not take Zofran. She sometimes also has nausea during the day, but not as much. She has not been able to identify a particular  trigger. She also occasionally has some mild reflux. She infrequently has hypogastric pains, usually associated with being constipated or having a menstrual period. Bowel movents alternate from diarrhea to constipation. The patient has no complaints of excessive hunger. Hands: Always wet and cold.  Legs: Muscle mass and strength "are okay". She occasionally has pains of her anterior thighs and anterior shins. There are no other complaints of numbness, tingling, burning, or pain. She notes edema about 3-4 days per week.  Feet: Feet are always wet and cold. There are no obvious foot problems. She occasionally has numbing of the medial 3-4 toes. There are no other complaints of numbness, tingling, burning, or pain. She often has dorsal edema.  GYN: As above. Her Seasonale contraceptive was causing her to have menses about every three months.   PAST MEDICAL, FAMILY, AND SOCIAL HISTORY  Past Medical History:  Diagnosis Date  . Allergy to alpha-gal    elevated IgE 04/10/16  . Autonomic dysfunction   . Chiari I malformation (HCC)   . Complication of anesthesia    slow to wake up  . Dyspnea   . Eczema   . GERD (gastroesophageal reflux disease)   . Headache   . Heart murmur   . Hypoglycemia   . Hypothyroid   . IBS (irritable bowel syndrome)   . Joint pain   . Mitochondrial disease (HCC)   . Muscle pain   . Pneumonia    several times  . PONV (postoperative nausea and vomiting)     Family History  Problem Relation Age of Onset  . Asthma Mother   . Allergic rhinitis Mother   . Heart murmur Mother   . Allergic rhinitis Sister   . Allergic rhinitis Brother   . Asthma Brother   . Seizures Brother   . Mitochondrial disorder Brother   . Allergic rhinitis Maternal Grandmother   . Asthma Maternal Grandmother   . Diabetes type II Maternal Grandmother   . Lung cancer Maternal Grandfather   . Angioedema Neg Hx   . Eczema Neg Hx   . Immunodeficiency Neg Hx   . Urticaria Neg Hx       Current Outpatient Medications:  .  ascorbic acid (VITAMIN C) 500 MG tablet, Take 500 mg by mouth daily. , Disp: , Rfl:  .  lansoprazole (PREVACID) 30 MG capsule, TAKE 1 CAPSULE BY MOUTH DAILY, Disp: 90 capsule, Rfl: 2 .  leucovorin (WELLCOVORIN) 25 MG tablet, Take 25 mg by mouth 2 (two) times daily. , Disp: , Rfl:  .  levOCARNitine (CARNITOR) 1 GM/10ML solution, TAKE 2 TEASPOONFUL ( ) BY MOUTH TWICEDAILY (Patient taking differently: Place 1,000 mg into feeding tube daily. ), Disp: 118 mL, Rfl: 12 .  levonorgestrel-ethinyl estradiol (SEASONALE) 0.15-0.03 MG tablet, TAKE 1 TABLET BY MOUTH DAILY (Patient taking differently: Take 1 tablet by mouth daily. ), Disp: 1 Package, Rfl: 3 .  metaxalone (SKELAXIN) 400 MG tablet, Take 1 tablet (400 mg total) by mouth daily. (Patient taking differently: Take 400 mg by mouth daily as needed (Muslce pain). ),  Disp: 30 tablet, Rfl: 6 .  Nutritional Supplements (ENSURE CLEAR) LIQD, 474 mLs by Gastrostomy Tube route daily. (Patient taking differently: 474 mLs by Gastrostomy Tube route 4 (four) times daily. During the day  By mouth or G tube), Disp: 60 Bottle, Rfl: 5 .  pregabalin (LYRICA) 25 MG capsule, Take 1 capsule (25 mg total) by mouth 2 (two) times daily., Disp: 60 capsule, Rfl: 3 .  pregabalin (LYRICA) 75 MG capsule, Take 2 capsules (150 mg total) by mouth at bedtime., Disp: 60 capsule, Rfl: 3 .  PRESCRIPTION MEDICATION, Apply 1 application topically as needed (port). Elma- cream , Disp: , Rfl:  .  promethazine (PHENERGAN) 25 MG tablet, Take 1 tablet at onset of migraine. May repeat every 6 hours PRN (Patient taking differently: Take 25 mg by mouth every 6 (six) hours as needed for nausea (migraine.). ), Disp: 30 tablet, Rfl: 0 .  pyridostigmine (MESTINON) 60 MG tablet, Take 1 tablet (60 mg total) by mouth in the morning, at noon, in the evening, and at bedtime., Disp: 120 tablet, Rfl: 5 .  rizatriptan (MAXALT) 10 MG tablet, TAKE 1 TABLET BY MOUTH AS  NEEDED FOR MIGRAINE. MAY REPEAT IN 2 HOURS IF NEEDED., Disp: 12 tablet, Rfl: 3 .  SUMAtriptan (IMITREX) 50 MG tablet, Take 1 tablet (50 mg total) by mouth every 2 (two) hours as needed for migraine. May repeat in 2 hours if headache persists or recurs., Disp: 12 tablet, Rfl: 5 .  SYNTHROID 88 MCG tablet, TAKE 1 TABLET BY MOUTH EVERY MORNING ON AN EMPTY STOMACH, Disp: 90 tablet, Rfl: 1 .  topiramate (TOPAMAX) 25 MG tablet, TAKE 1 TABLET BY MOUTH EACH NIGHT AT BEDTIME, Disp: 30 tablet, Rfl: 3 .  Topiramate ER (TROKENDI XR) 50 MG CP24, Take 50 mg by mouth daily., Disp: 31 capsule, Rfl: 3 .  albuterol (PROVENTIL HFA) 108 (90 Base) MCG/ACT inhaler, Inhale 2 puffs into the lungs every 4 (four) hours as needed for wheezing or shortness of breath. (Patient not taking: Reported on 11/17/2019), Disp: 6.7 g, Rfl: 2 .  azelastine (ASTELIN) 0.1 % nasal spray, Place 2 sprays into both nostrils 2 (two) times daily. Use in each nostril as directed (Patient not taking: Reported on 11/17/2019), Disp: 30 mL, Rfl: 5 .  clobetasol (OLUX) 0.05 % topical foam, Apply topically 2 (two) times daily. Apply to affected areas twice daily as needed taking care to avoid axillae and groin area. (Patient not taking: Reported on 11/28/2019), Disp: 45 g, Rfl: 3 .  Coenzyme Q-10 200 MG CAPS, Take 1 capsule by mouth daily.  (Patient not taking: Reported on 11/28/2019), Disp: , Rfl:  .  cromolyn (GASTROCROM) 100 MG/5ML solution, Take 5 mLs (100 mg total) by mouth 4 (four) times daily -  before meals and at bedtime. (Patient not taking: Reported on 11/17/2019), Disp: 600 mL, Rfl: 12 .  EPINEPHrine 0.3 mg/0.3 mL IJ SOAJ injection, Inject 0.3 mg into the muscle once. As needed (Patient not taking: Reported on 11/28/2019), Disp: , Rfl:  .  fludrocortisone (FLORINEF) 0.1 MG tablet, Take one tablet each morning. (Patient not taking: Reported on 09/21/2019), Disp: 30 tablet, Rfl: 11 .  ibuprofen (ADVIL) 800 MG tablet, Take 1 tablet (800 mg total) by  mouth every 8 (eight) hours as needed. (Patient not taking: Reported on 11/28/2019), Disp: 30 tablet, Rfl: 0 .  levocetirizine (XYZAL) 5 MG tablet, TAKE 1 TABLET BY MOUTH EVERY EVENING (Patient not taking: Reported on 11/28/2019), Disp: 30 tablet, Rfl: 1 .  lidocaine (XYLOCAINE) 5 % ointment, Apply 1 application topically as needed. (Patient not taking: Reported on 11/28/2019), Disp: 35.44 g, Rfl: 3 .  Nutritional Supplements (FEEDING SUPPLEMENT, VITAL 1.5 CAL,) LIQD, Place 1,000 mLs into feeding tube continuous. bedtime (Patient not taking: Reported on 11/28/2019), Disp: , Rfl:  .  ondansetron (ZOFRAN-ODT) 4 MG disintegrating tablet, Take 1 tablet under the tongue every 6-8 hours as needed for nausea. (Patient not taking: Reported on 11/28/2019), Disp: 30 tablet, Rfl: 5  Allergies as of 11/28/2019 - Review Complete 11/28/2019  Allergen Reaction Noted  . Other Anaphylaxis 03/29/2013  . Latex Rash 04/08/2012  . Lactated ringers  05/28/2018  . Propofol Other (See Comments) 05/17/2012  . Tape Itching and Other (See Comments) 05/17/2012     reports that she has never smoked. She has never used smokeless tobacco. She reports that she does not drink alcohol and does not use drugs. Pediatric History  Patient Parents  . Slee,Leesa (Mother)  . Hilbert Corrigan (Father)   Other Topics Concern  . Not on file  Social History Narrative   Dotsie graduated from [employer]. Will attend [employer].    She enjoys reading, playing with her cat and her dog, and hanging out with her sister.     1. School and Family: She attends [employer] with on-line classes and lives at home.  2. Activities: Sedentary 3. Primary Care Provider: [name], MD She is due to see a new internist soon.  4. Peds Neurology: Dr. [name], MD 5. Allergy: [facility]. Malachi Bonds[facility] [place] 6. GI: No one at present 7. Peds Endocrinology: [facility] 8. Peds  Pulmonary: Dr. Damita Lack 9. Genetics: [facility] in the past 10. GYN: None  REVIEW OF SYSTEMS: There are no other significant problems involving Leighton's other body systems.    Objective:  Objective  Vital Signs:  BP 100/60   Pulse 84   Wt 116 lb 12.8 oz (53 kg)   BMI 20.05 kg/m    Ht Readings from Last 3 Encounters:  11/17/19 5\' 4"  (1.626 m)  09/21/19 5\' 3"  (1.6 m)  08/10/19 5\' 4"  (1.626 m)   Wt Readings from Last 3 Encounters:  11/28/19 116 lb 12.8 oz (53 kg)  11/17/19 115 lb 12.8 oz (52.5 kg)  09/21/19 120 lb (54.4 kg)   HC Readings from Last 3 Encounters:  No data found for [facility]   Body surface area is 1.55 meters squared. Facility age limit for growth percentiles is 20 years. Facility age limit for growth percentiles is 20 years.   PHYSICAL EXAM:  Constitutional: Margrett looks somewhat tired, but is awake and alert. Her affect is rather flat. Her insight is good. She has re-gained 1 pound in 4 months. She is a bit more active.  Eyes: There is no arcus or proptosis.  Mouth: The oropharynx appears normal. The tongue appears normal. There is normal oral moisture. There is no obvious gingivitis. Thre is no oral hyperpigmentation.  Neck: There are no bruits present. The thyroid gland appears mildly enlarged.  The thyroid gland is approximately 21-22. grams in size. Both lobes are enlarged, with the left lobe being much larger. The consistency of the thyroid gland is somewhat full. She has right thyroid tenderness to palpation.  Lungs: The lungs are clear. Air movement is good. Heart: The heart rhythm and rate appear normal. Heart sounds S1 and S2 are normal. I do not appreciate any pathologic heart murmurs. Abdomen: The abdominal  size is normal. Bowel sounds are normal. The abdomen is soft and tender to the right of the umbilicus. The G-tube site is clean. There is no obviously palpable hepatomegaly, splenomegaly, or other masses.  Arms: Muscle mass appears appropriate for age.   Hands: There is no obvious tremor. Phalangeal and metacarpophalangeal joints appear normal. Palms are normal. Nails are not pallid.  Legs: Muscle mass appears appropriate for age. There is no edema.  Neurologic: Muscle strength is 4/5 in the UEs and 4-5/5 in th LEs. Muscle tone appears normal. Sensation to touch is normal in the legs.    LAB DATA:   Labs 11/17/19: TSH 1.21, free T4 1.1, free T3 2.9 (ref 3.0-4.7); CBC normal; iron 69; LH <0.2, FSH 0.7,   Labs 07/25/19: HbA1c 4.8%,  TSH 1.67, free T4 1.3, free T3 3.0; CBC normal, except RBC 5.22 (ref 3.8-5.1) and Hct 45.5% (ref 35-45%); iron 79 (ref 40-190)  Labs 03/24/19: Serum sodium 141, potassium 5.2, chloride 19, CO2 18, glucose 81  Labs 02/10/19: HbA1c 5.0%; TSH 0.73, free T4 1.3, free T3 3.0; CMP normal  Labs 11/12/18: TSH 1.12, free T4 1.38, T3 156 (ref 71-180); prolactin 11.3 (ref 4.8-23.3)  Labs 09/16/18: Androstenedione 55 (ref 51-230), DHEAS 67 (ref 51-321)  Labs 08/30/18 at 8:43 AM: ACTH 13 (ref 6-50), cortisol 26.2 (ref 4-22), aldosterone 4 (ref < 28), plasma renin activity 2.4 (ref 0.25-5.82),   Labs 08/02/18: TSH 2.12, free T4 1.3, free T3 3.0; CMP normal; aldosterone <1 (reg 3-28), plasma renin activity (PRA) 0.92 (ref 0.25-5.82); prolactin 9.9 (ref 3-30)  Labs 02/01/18 at 8 AM: TSH 4.16 (ref 0.50-4.30, but many endocrinologists consider 3.4 to be the true physiologic upper limit of normal), free T4 1.4 (ref 0.8-1.4), free T3 2.7 (ref 3.0-4.7), TPO antibody 241 (ref <9), thyroglobulin antibody 1 (ref < or = 1); CMP normal except CO2 19; cholesterol 123, triglycerides 114, HDL 37, LDL 66; Prolactin 16.1 (ref 3.3-20); IGFBP-3 7.8 (ref 3.2-7.9); IGF-1 249 (ref 108-548, but very appropriate for a young woman whose height has plateaued); iron 177 (ref 27-164); CBC normal; androstenedione 46 (ref 51-230), DHEAS 74 (ref 51-321); ACTH 14, cortisol 28.9  Labs 11/25/17: TSH 1.58; CBC normal except 736 eosinophils (ref 15-500)  Labs  06/24/17: TSH 2.418, free T4 0.98  Labs 04/21/17: TSH 1.30  Labs 03/10/17: TSH 7.87, free T4 1.2; CBC normal; prolactin 7.1  Labs 06/24/16: TSH 1.960, free T4 1.12  Labs 04/10/16: TSH 2.28, TPO antibody 165 (ref <9), thyroglobulin antibody 2 (ref <2)  Labs 05/29/15: TSH 1.640, free T4 1.09  Labs 11/28/14: TSH 1.87, free T4 0.80  Labs 05/31/2014: TSH 1.680, free T4 1.23  Labs 04/19/14: TSH 1.19  Labs 09/30/13: TSH 2.53, free T4 1.14  Labs 02/04/13: TSH 0.88, T4 11.3  Labs 10/18/12: TSH 10.35 (ref 0.5-4.5), free T4 0.67 (ref 0.8-2.0)    Assessment and Plan:  Assessment  ASSESSMENT:  1-3. Hypothyroid, acquired/goiter/thyroiditis:   A. The occurrence of autoimmune thyroid disease with positive anti-thyroid antibodies is very common.   B. At her visit in January 2020 her TSH was >4.0 and her TPO antibody level was higher. We increased her Synthroid dose to 88 mcg/day.   C. From January to August 2020 her goiter remained the same in overall size, but the lobes had shifted in size. The process of waxing and waning of thyroid gland size was c/w evolving Hashimoto's thyroiditis.   D. At her August visit her TFTs were at about the 30% of the physiologic  range. I would usually have  increased the Synthroid dose to 100 mcg/day, but decided to await the results of her Hypothalamic-Pituitary-Adrenal (HPA) axis testing. Fortunately, those results were normal.   E. Since her August 2020 visit she has had intermittent flare ups of thyroiditis.   E. Her TFTs drawn on 11/12/18 were mid-euthyroid on her current Synthroid dose of 88 mcg/day. Her TFTs on 02/10/19 were at about the 75% of the physiologic range. Her TFTs in July 2021 were mid-euthyroid. Her TFTS in November were mid-euthyroid according to her TSH and free T4, but slightly low according to her free T3. These changes are c/w a flare up of thyroiditis, which she is still having. She looks mildly hypothyroid now. We will continue this 88 mcg dose for  now, but repeat her TFTs prior to her next visit.    F.Her goiter is more asymmetric today. A thyroid US is indicated.  4. Dizziness/hypotension:   A. She still occasionally has elements of spinning dizziness c/w vertigo, orthostatic dizziness c/w POTS /dysautonomia and intermittent dehydration. Fortunately, these symptoms have been less frequent and less severe.   B. Chewing gum and taking meclizine can help the vertigo.  C. Remaining well hydrated can help with the ortho stasis.    D. Since both her aldosterone and renin were "lowish" in October. It was reasonable to conduct a therapeutic trial of fludrocortisone. 0.1 mg/day. Unfortunately, she felt that the medication did not help her, so stopped it.  5-7. Weakness/fatigue/cold sensation:    A. The cause(s) of these problems is/are unclear. 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   B. Her ACTH and adrenal hormone tests were normal in January 2020 and again in August and September 2020. Her aldosterone was very low in July 2020 but normal in August.   C. As noted above, she was euthyroid in July and November 2021.   D. She could have fibromyalgia, chronic fatigue, or some other "autoimmune" problem. 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   E. Giving the infusions of D5W every week has made Cova feel better.   F. Hot showers cause her to feel weak and dizzy. This problem is c/w orthostatic hypotension and with dysautonomia. 8. Hypoglycemia:   A. The cause(s) of this problem is/are also unclear. Her hypothalamic-pituitary-hepatic axis for [facility] and her hypothalamic-pituitary-adrenal axis seemed to be normal previously. She could have celiac disease, but her tests in December 2019 were negative. She could have a problem with glycogen storage, glycogenolysis, and/or gluconeogenesis. The fact that she often does not eat much, and the fact that she appears to have a very active gastro-colic reflex may  interact to impair glucose intake, glucose absorption, and glycogen synthesis.  B. She has still occasionally had some hypoglycemic symptoms.  9. Abnormal menses:   A. The cause(s) of this problem is/are also unclear. Mother gives a good history for her own endometriosis that resolved after treatment with GnRH agonists.   B.Joylynn has had vaginal bleeding for about the past 3 weeks. She needs to see GYN. I sent a message to [facility] asking if they have a particular GYN they would like Malayzia to see. They will make a referral to GYN.  10. Alternating constipation and diarrhea: She has a very active gastro-colic reflex. She was seen by GI in February 2021, but has not returned to clinic. She is doing well now.  11. Hyperprolactinemia:   A. It is highly likely that her  elevated prolactin In January 2020 was due to her hypothyroidism.   B. Her prolactin level in July 2020 was well within normal, paralleling her improvement in thyroid hormone status. Her prolactin level in October 2020 was also quite normal.     12. Hyperhidrosis, palms and soles: She may have dysautonomia.  13. Dehydration: She is consciously drinking 32 ounces of fluid per day. This problem could be due to an inadequate renin-angiotensin-aldosterone system, or to chronic diarrhea, or to other causes.   14. Bradycardia:   A. She is not on a beta-blocker or combined alpha-beta blocker. The fact that mom also has bradycardia suggests a dysautonomia. Hypothyroidism may also have been a causal factor in the past.  B. Recently, most of her HRs have been in the 60s-70s. 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 The aldosterone level and the renin level in August were normal. Her renal function and her adrenal functions were normal in August and September. As noted above, we tried to  conduct a therapeutic trial of fludrocortisone.  16. Breathing difficulty:  Dr. Damita Lack was following this  problem, but referred her back to Dr. Artis Flock.   17. Pallid nail beds. Her CBC in July and November were essentially normal. Her iron levels in July and November were normal. She does not have pallor today.  PLAN:  1. Diagnostic: I reviewed lab results. I ordered TFTs to be done 1-2 weeks prior to her next visit.  I ordered a thyroid US.  2. Therapeutic: Continue Synthroid 88 mcg/day for now.   3. Patient education: We discussed all of the above at very great length. We again discussed the [place] method to maintain BP when she arises from a supine or sitting position.   4. Follow-up: 4 months.     David Stall, MD, CDE Pediatric and Adult Endocrinology

## 2019-11-30 ENCOUNTER — Encounter (INDEPENDENT_AMBULATORY_CARE_PROVIDER_SITE_OTHER): Payer: Self-pay

## 2019-11-30 ENCOUNTER — Other Ambulatory Visit: Payer: Self-pay | Admitting: Internal Medicine

## 2019-12-01 ENCOUNTER — Other Ambulatory Visit (INDEPENDENT_AMBULATORY_CARE_PROVIDER_SITE_OTHER): Payer: Self-pay | Admitting: "Endocrinology

## 2019-12-01 ENCOUNTER — Ambulatory Visit (INDEPENDENT_AMBULATORY_CARE_PROVIDER_SITE_OTHER): Payer: Commercial Managed Care - PPO | Admitting: Internal Medicine

## 2019-12-01 ENCOUNTER — Ambulatory Visit
Admission: RE | Admit: 2019-12-01 | Discharge: 2019-12-01 | Disposition: A | Discharge status: Home / Self Care | Payer: Commercial Managed Care - PPO | Source: Ambulatory Visit | Attending: "Endocrinology | Admitting: "Endocrinology

## 2019-12-01 ENCOUNTER — Encounter: Payer: Self-pay | Admitting: Internal Medicine

## 2019-12-01 ENCOUNTER — Other Ambulatory Visit: Payer: Self-pay

## 2019-12-01 DIAGNOSIS — N921 Excessive and frequent menstruation with irregular cycle: Secondary | ICD-10-CM

## 2019-12-01 DIAGNOSIS — Z0000 Encounter for general adult medical examination without abnormal findings: Secondary | ICD-10-CM | POA: Diagnosis not present

## 2019-12-01 DIAGNOSIS — E049 Nontoxic goiter, unspecified: Secondary | ICD-10-CM

## 2019-12-01 DIAGNOSIS — G7000 Myasthenia gravis without (acute) exacerbation: Secondary | ICD-10-CM | POA: Diagnosis not present

## 2019-12-01 DIAGNOSIS — I7300 Raynaud's syndrome without gangrene: Secondary | ICD-10-CM | POA: Diagnosis not present

## 2019-12-01 DIAGNOSIS — N920 Excessive and frequent menstruation with regular cycle: Secondary | ICD-10-CM | POA: Insufficient documentation

## 2019-12-01 IMAGING — US US THYROID
1 series · 14 of 25 positions shown · non-contrast
Comparison: None.

CLINICAL DATA: Asymmetric goiter, JOSHJAX, hypothyroid

EXAM:
THYROID ULTRASOUND
TECHNIQUE: Ultrasound examination of the thyroid gland and adjacent soft
tissues was performed.

[Series 1: us thyroid · 0.03mm/px · 45 acquisitions, 14 frames shown]
[im 1/45]
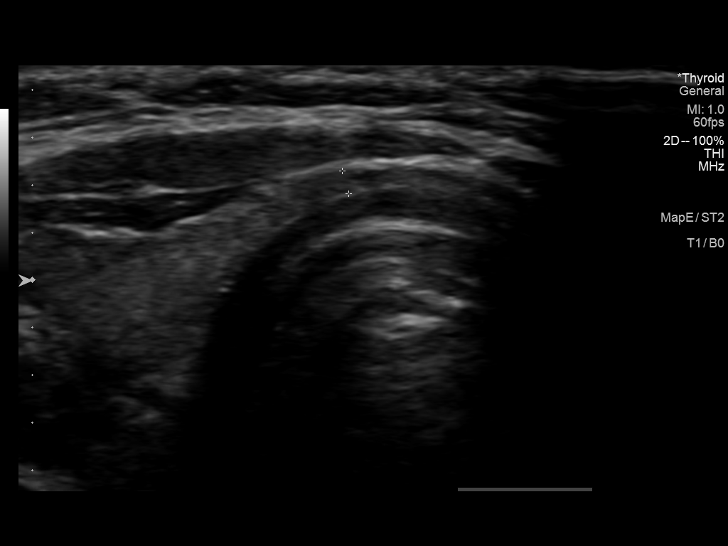
[im 4/45]
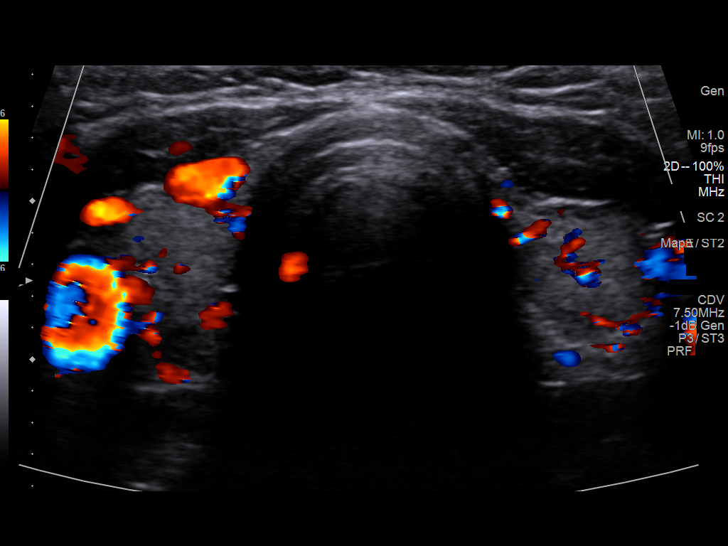
[im 8/45]
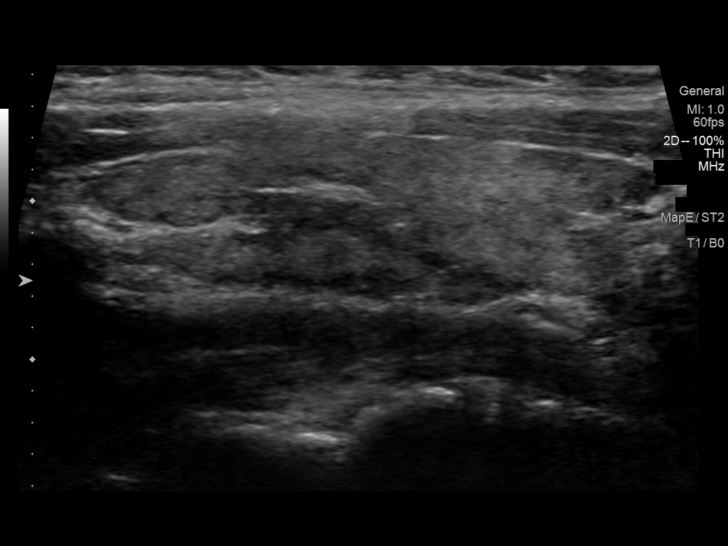
[im 12/45]
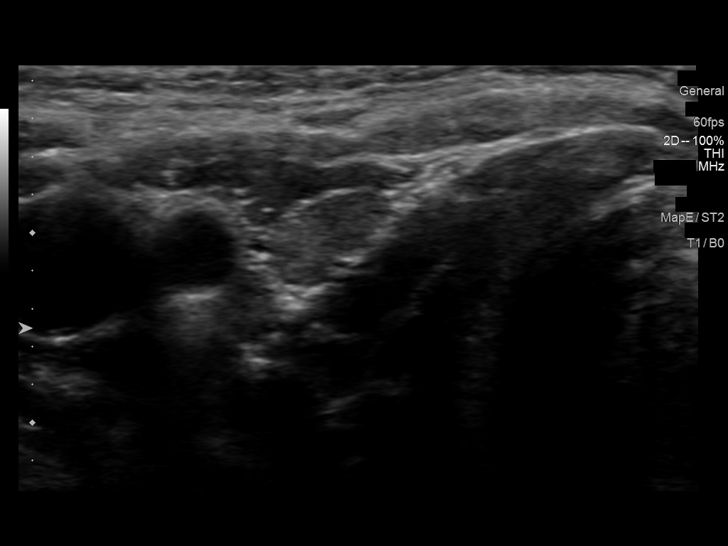
[im 15/45]
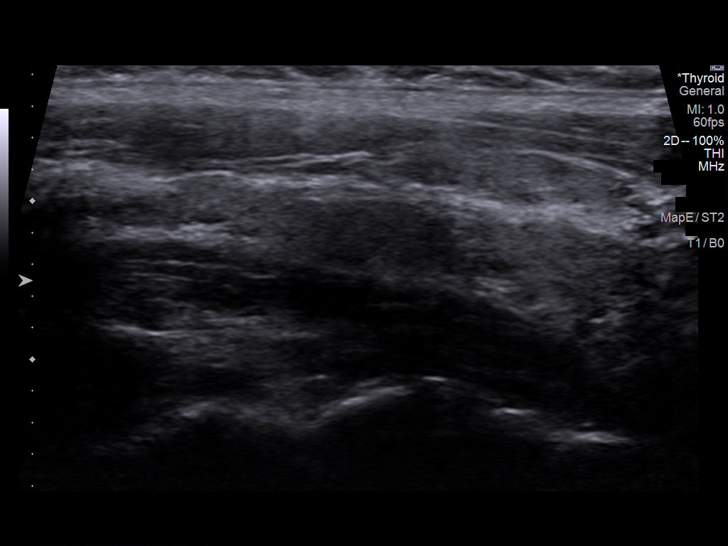
[im 17/45]
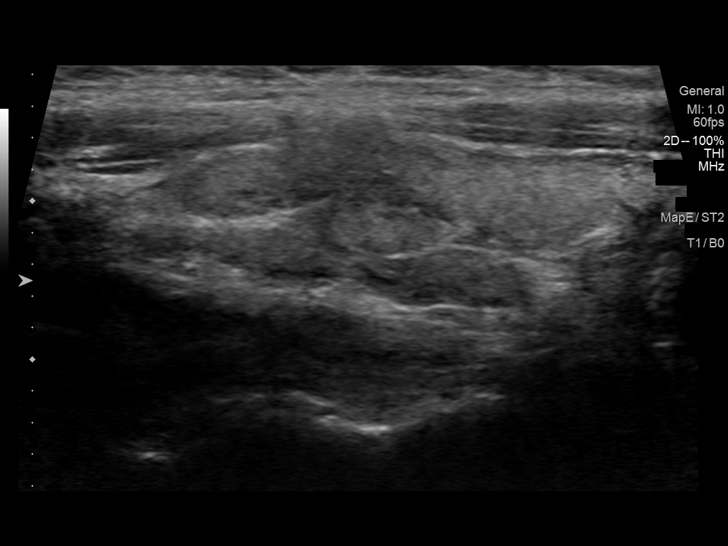
[im 21/45]
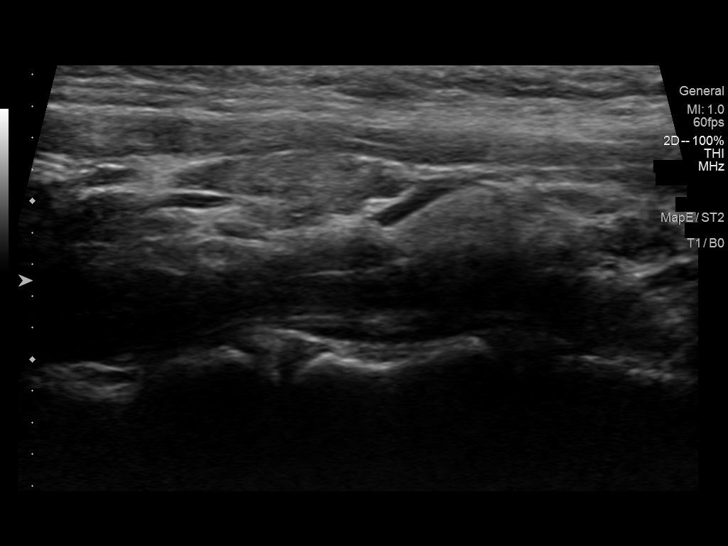
[im 24/45]
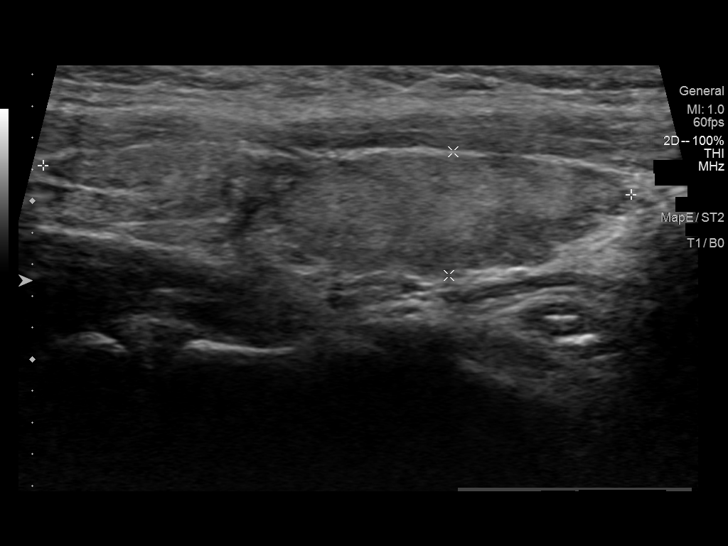
[im 28/45]
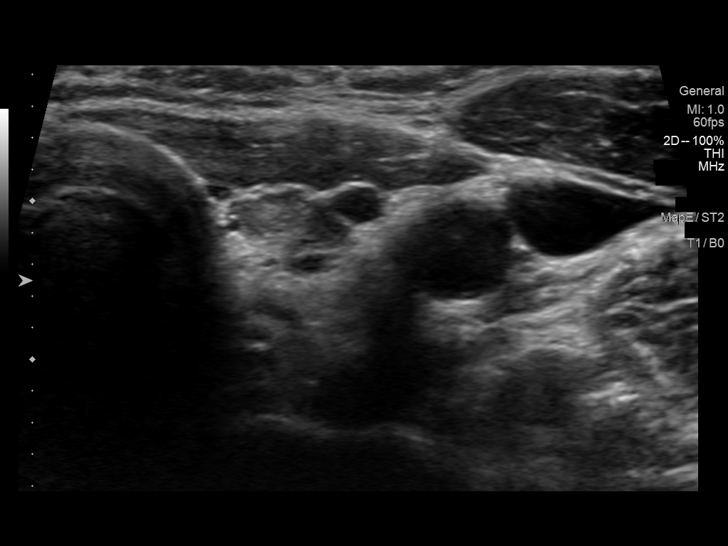
[im 30/45]
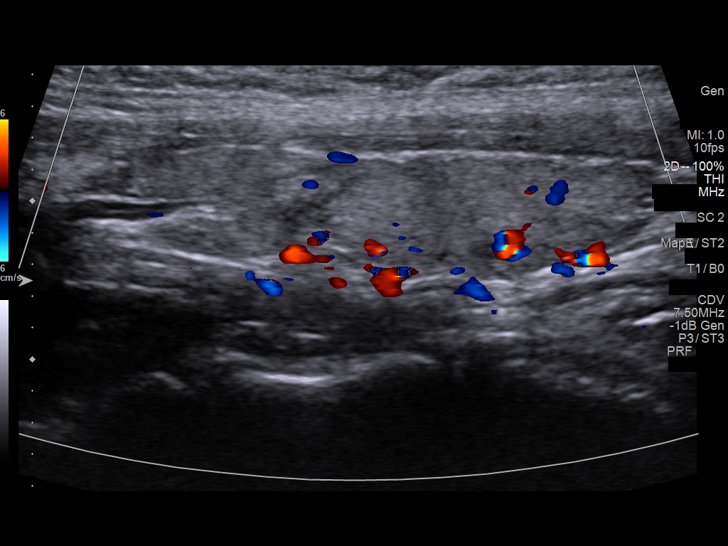
[im 34/45]
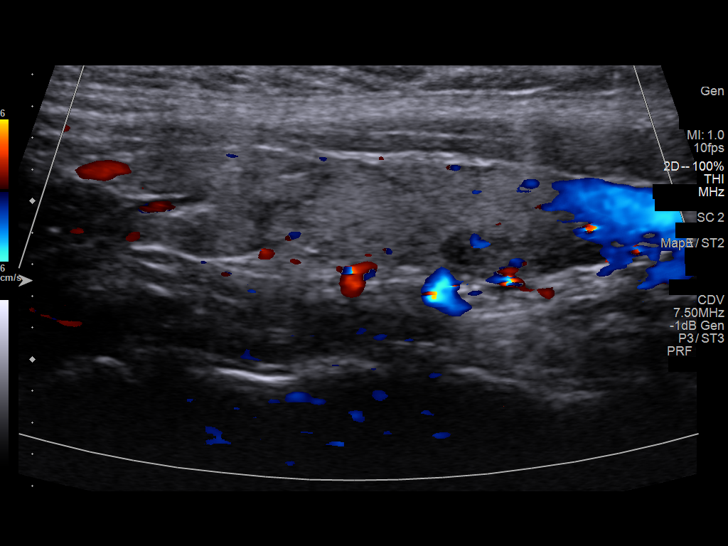
[im 37/45]
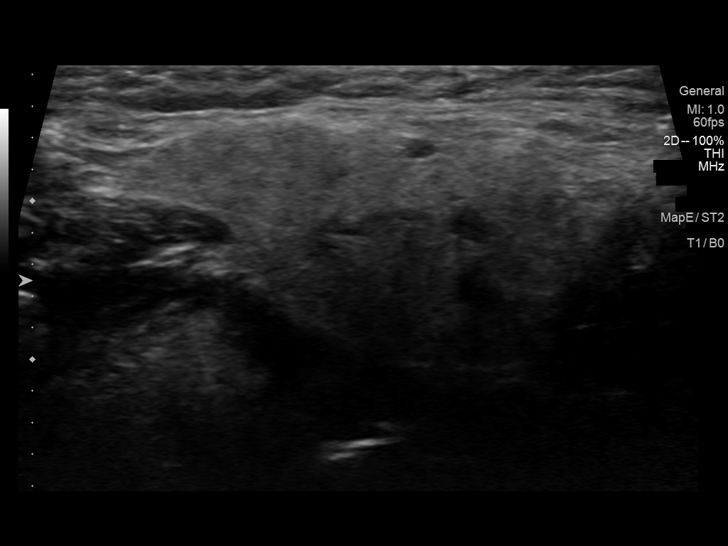
[im 41/45]
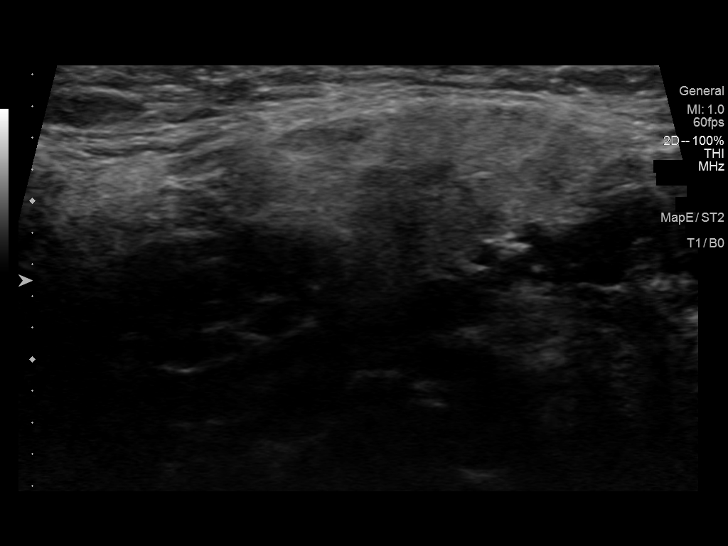
[im 45/45]
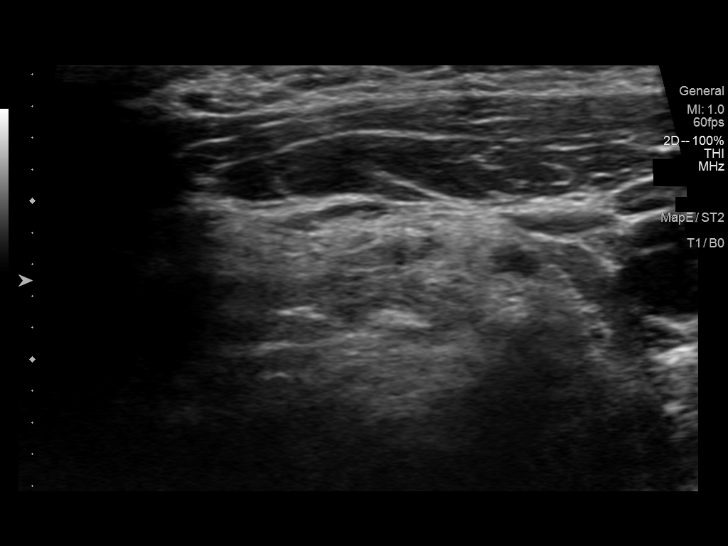

[14 of 25 positions shown; findings below may reference images not displayed]

FINDINGS: Parenchymal Echotexture: Markedly heterogenous

Isthmus:   0.1 cm thickness

Right lobe: 3.6 x 0.9 x 1.1 cm

Left lobe: 3.7 x 0.8 x 1.1 cm

_________________________________________________________

Estimated total number of nodules >/= 1 cm: 0

Number of spongiform nodules >/=  2 cm not described below (TR1): 0

Number of mixed cystic and solid nodules >/= 1.5 cm not described
below (TR2): 0

_________________________________________________________

0.3 cm hypoechoic nodule, inferior left lobe; This nodule does NOT
meet TI-RADS criteria for biopsy or dedicated follow-up. No regional
adenopathy identified.
IMPRESSION: Small heterogenous thyroid. No indication for biopsy or dedicated
imaging follow-up.

The above is in keeping with the ACR TI-RADS recommendations - [HOSPITAL] [XO];[DATE].

## 2019-12-01 NOTE — Patient Instructions (Addendum)
It was nice seeing you today! Thank you for choosing [facility] for your Primary Care.    Today we talked about:   - Heavy vaginal bleeding: We ordered a trans-vaginal ultrasound today to evaluate for cysts or fibroids that can contribute to heavy bleeding. You will be called to schedule this.   - In the upcoming cold months, please let us know if your Raynauds flares.   - Let's follow up in 6 months.   - If you have any acute symptoms occur, please contact the clinic.

## 2019-12-02 DIAGNOSIS — G901 Familial dysautonomia [Riley-Day]: Secondary | ICD-10-CM | POA: Insufficient documentation

## 2019-12-02 DIAGNOSIS — Z0000 Encounter for general adult medical examination without abnormal findings: Secondary | ICD-10-CM | POA: Insufficient documentation

## 2019-12-02 DIAGNOSIS — G7000 Myasthenia gravis without (acute) exacerbation: Secondary | ICD-10-CM | POA: Insufficient documentation

## 2019-12-02 NOTE — Assessment & Plan Note (Signed)
Laura Parrish states that she has been having approximately 1 month of persistent vaginal bleeding, that is not always heavy, but becomes heavier when her normal menstrual cycle coincides.  She states that when she is not on her menstrual cycle, she is been having to use approximately 4 pads or tampons every day for the last month.  In the past couple days, she has had to increase to 6-7 pads or tampons per day.  She has discussed in the past with her endocrinologist that she would need referral to gynecology especially in light of her complex past medical history.  It seems that her endocrinologist is currently working on this at this time.  Assessment/plan: We will defer additional management, including changing of birth control, to OB/GYN.  However given that referral will likely take a bit of time, will start work-up with a pelvic ultrasound, including TVUS to evaluated for cysts or fibroids.  Laura Parrish and her mom are in agreement with this plan.  -Pelvic ultrasound with TVUS -Follow-up with gynecology

## 2019-12-02 NOTE — Assessment & Plan Note (Signed)
Vaccinations up to date

## 2019-12-02 NOTE — Progress Notes (Signed)
CC: Establish care; abnormal vaginal bleeding   HPI:  Laura Parrish is a 19 y.o. with a PMHx as listed below who presents to the clinic for Establish care; abnormal vaginal bleeding .   Please see the Encounters tab for problem-based Assessment & Plan regarding status of patient's acute and chronic conditions.  Past Medical History:  Diagnosis Date  . Allergy to alpha-gal    elevated IgE 04/10/16  . Autonomic dysfunction   . Chiari I malformation (HCC)   . Complication of anesthesia    slow to wake up  . Dyspnea   . Eczema   . GERD (gastroesophageal reflux disease)   . Headache   . Heart murmur   . Hypoglycemia   . Hypothyroid   . IBS (irritable bowel syndrome)   . Joint pain   . Mitochondrial disease (HCC)   . Muscle pain   . Pneumonia    several times  . 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  Older sister occasionally stays there For fun, she plays with her cats and dogs  Review of Systems: Review of Systems  Constitutional: Positive for malaise/fatigue (Intermittent, chronically). Negative for chills, fever and weight loss.  HENT: Negative for congestion and sore throat.   Respiratory: Positive for shortness of breath (Chronic, unchanged). Negative for cough and wheezing.   Cardiovascular: Negative for chest pain, palpitations and leg swelling.  Gastrointestinal: Negative for abdominal pain, diarrhea, nausea and vomiting.  Genitourinary: Negative for dysuria, hematuria and urgency.       + Vaginal bleeding  Musculoskeletal: Negative for back pain, joint pain, myalgias and neck pain.  Skin: Negative for itching and rash.        + Left eye swelling, resolving  Neurological: Positive for dizziness (Chronic) and weakness (Chronic). Negative for focal weakness.   Physical Exam:  Vitals:   12/01/19 0833  BP: 116/73  Pulse: 71  Temp: 98.3 F (36.8 C)  TempSrc: Oral  SpO2: 100%  Weight: 117 lb 11.2 oz (53.4 kg)  Height: 5\' 4"  (1.626 m)   Physical Exam Vitals and nursing note reviewed.  Constitutional:      General: She is not in acute distress.    Appearance: She is normal weight.  HENT:     Head: Normocephalic and atraumatic.     Mouth/Throat:     Mouth: Mucous membranes are moist.     Pharynx: Oropharynx is clear. No oropharyngeal exudate or posterior oropharyngeal erythema.  Eyes:     Extraocular Movements: Extraocular movements intact.     Conjunctiva/sclera: Conjunctivae normal.     Pupils: Pupils are equal, round, and reactive to light.  Cardiovascular:     Rate and Rhythm: Normal rate and regular rhythm.     Heart sounds: No murmur heard.  No gallop.   Pulmonary:     Effort: Pulmonary effort is normal. No respiratory distress.     Breath sounds: No wheezing, rhonchi or rales.  Abdominal:     General: Bowel sounds are normal. There is no distension.     Palpations: Abdomen is soft.     Tenderness: There is no abdominal tenderness. There is no guarding.  Musculoskeletal:  General: No tenderness.     Right lower leg: No edema.     Left lower leg: No edema.  Skin:    General: Skin is warm and dry.  Neurological:     General: No focal deficit present.     Mental Status: She is alert and oriented to person, place, and time. Mental status is at baseline.     Gait: Gait normal.  Psychiatric:        Mood and Affect: Mood normal.        Behavior: Behavior normal.        Thought Content: Thought content normal.        Judgment: Judgment normal.    Assessment & Plan:   See Encounters Tab for problem based charting.  Patient discussed with Dr. Antony Contras

## 2019-12-02 NOTE — Assessment & Plan Note (Signed)
Neville states that she has begun the work-up for myasthenia gravis, however due to difficulty obtaining a single-fiber biopsy, her treatment team have decided to begun empiric treatment for myasthenia gravis.  She has been taking pyridostigmine 60 mg 4 times every day and has had significant improvement with her symptoms, including weakness.  She continues to follow-up with pediatric neurology at this time.  Assessment/plan: -We will continue to follow with neurology

## 2019-12-02 NOTE — Assessment & Plan Note (Signed)
Laura Parrish endorses occasional color changes within her hands and feet where they become purple or bright light.  These episodes do not generally last very long, less than 30 minutes, and are not painful for her.  She notes that she has been diagnosed with Raynaud's in the past.  Assessment/plan: Raynaud's phenomenon likely a constellation with her other autoimmune disorders.  Recommended that she reach out to the clinic should these episodes last longer than they have previously or if she develops any blackening of her fingertips or toes.  At that point, patient may benefit from a calcium channel blocker.  -We will continue to monitor

## 2019-12-05 NOTE — Progress Notes (Signed)
Internal Medicine Clinic Attending  Case discussed with Dr. Basaraba  At the time of the visit.  We reviewed the resident's history and exam and pertinent patient test results.  I agree with the assessment, diagnosis, and plan of care documented in the resident's note.  

## 2019-12-07 ENCOUNTER — Encounter (INDEPENDENT_AMBULATORY_CARE_PROVIDER_SITE_OTHER): Payer: Self-pay | Admitting: *Deleted

## 2019-12-07 ENCOUNTER — Encounter (INDEPENDENT_AMBULATORY_CARE_PROVIDER_SITE_OTHER): Payer: Self-pay | Admitting: Pediatrics

## 2019-12-12 ENCOUNTER — Ambulatory Visit (HOSPITAL_COMMUNITY): Payer: Commercial Managed Care - PPO

## 2019-12-13 ENCOUNTER — Other Ambulatory Visit (INDEPENDENT_AMBULATORY_CARE_PROVIDER_SITE_OTHER): Payer: Self-pay | Admitting: Pediatrics

## 2019-12-13 DIAGNOSIS — G43009 Migraine without aura, not intractable, without status migrainosus: Secondary | ICD-10-CM

## 2019-12-14 ENCOUNTER — Telehealth (INDEPENDENT_AMBULATORY_CARE_PROVIDER_SITE_OTHER): Payer: Self-pay | Admitting: Family

## 2019-12-14 NOTE — Telephone Encounter (Signed)
Who's calling (name and relationship to patient) : [facility]  Best contact number: [phone]  Provider they see: [name]  Reason for call: On 10/10  Correct frequency skilled nursing. This isn't new. It is the original and is what it needed to be. It had to be corrected on their end.   This order does need to be signed and dated by [name] and faxed back. Fax number: [phone]  Orders were sent again on Monday 11/29  Call ID:      PRESCRIPTION REFILL ONLY  Name of prescription:  Pharmacy:

## 2019-12-15 NOTE — Telephone Encounter (Signed)
The form was faxed to [facility] today. TG

## 2019-12-19 ENCOUNTER — Other Ambulatory Visit: Payer: Self-pay

## 2019-12-19 ENCOUNTER — Ambulatory Visit (HOSPITAL_COMMUNITY)
Admission: RE | Admit: 2019-12-19 | Discharge: 2019-12-19 | Disposition: A | Discharge status: Home / Self Care | Payer: Commercial Managed Care - PPO | Source: Ambulatory Visit | Attending: Internal Medicine | Admitting: Internal Medicine

## 2019-12-19 DIAGNOSIS — N921 Excessive and frequent menstruation with irregular cycle: Secondary | ICD-10-CM | POA: Insufficient documentation

## 2019-12-19 IMAGING — US US PELVIS COMPLETE WITH TRANSVAGINAL
1 series · 13 of 25 positions shown · non-contrast
Comparison: [DATE]

CLINICAL DATA: Heavy menstrual bleeding

EXAM:
TRANSABDOMINAL AND TRANSVAGINAL ULTRASOUND OF PELVIS
DOPPLER ULTRASOUND OF OVARIES
TECHNIQUE: Both transabdominal and transvaginal ultrasound examinations of the
pelvis were performed. Transabdominal technique was performed for
global imaging of the pelvis including uterus, ovaries, adnexal
regions, and pelvic cul-de-sac.
It was necessary to proceed with endovaginal exam following the
transabdominal exam to visualize the uterus endometrium ovaries.
Color and duplex Doppler ultrasound was utilized to evaluate blood
flow to the ovaries.

[Series 1: us pelvis complete with transvaginal · 72 acquisitions, 13 frames shown]
[im 1/72]
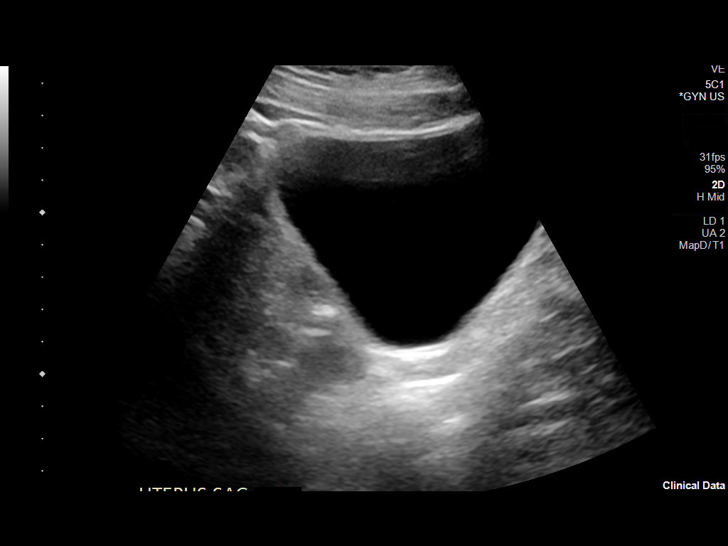
[im 6/72]
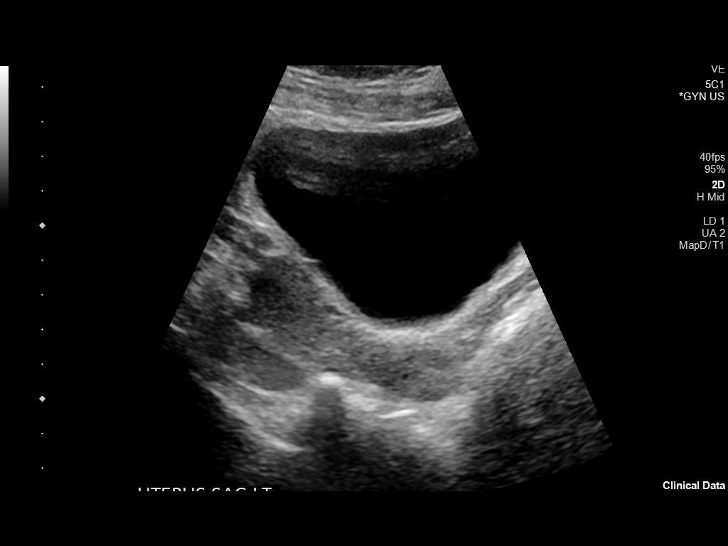
[im 12/72]
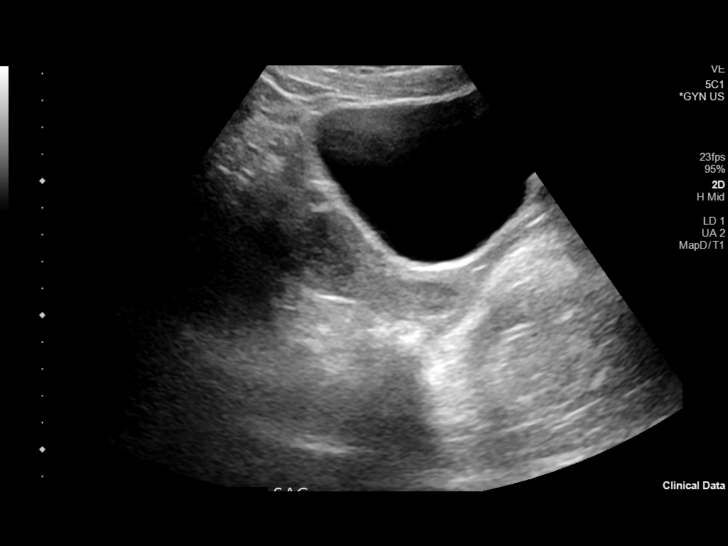
[im 18/72]
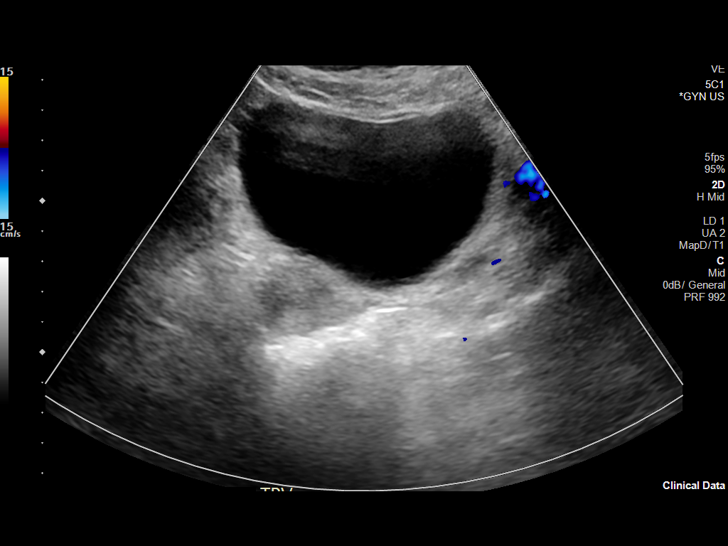
[im 24/72]
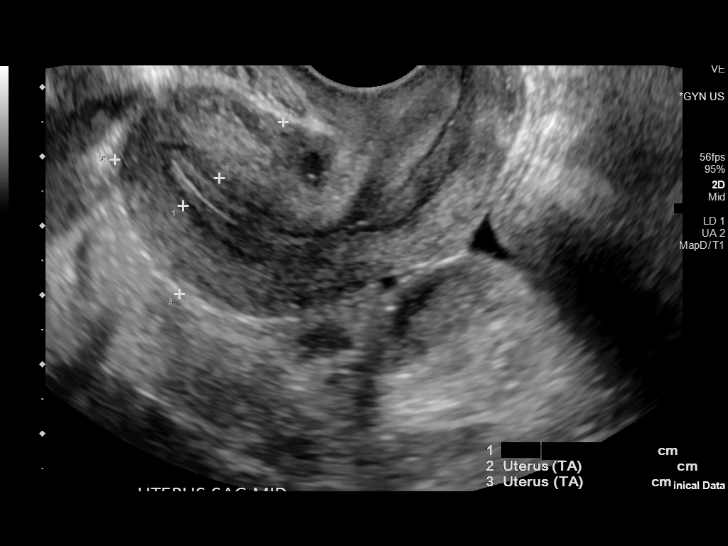
[im 30/72]
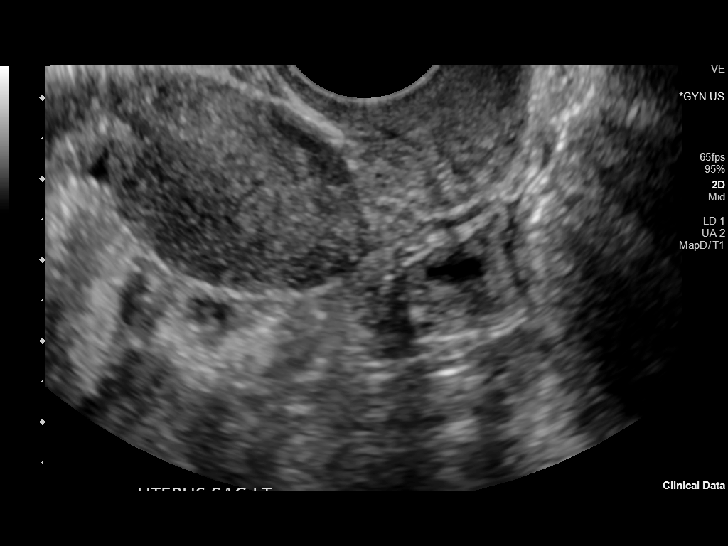
[im 36/72]
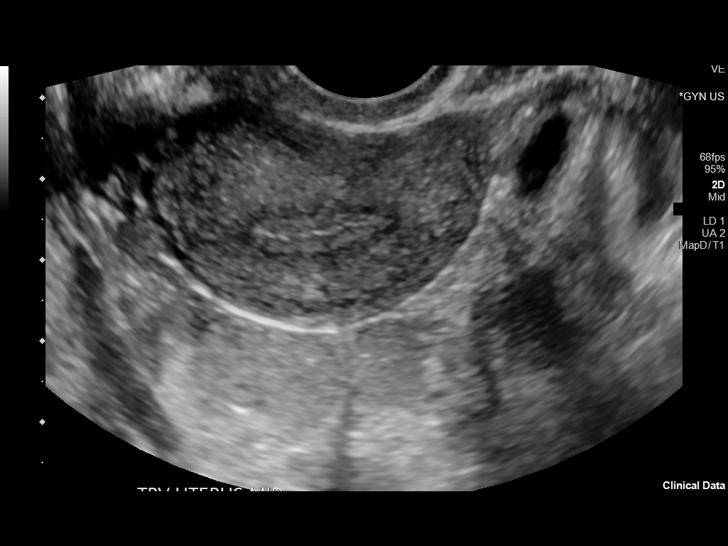
[im 42/72]
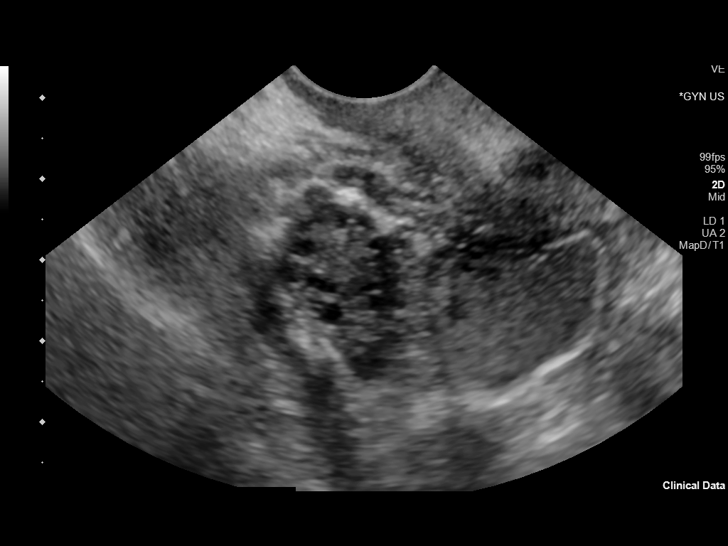
[im 48/72]
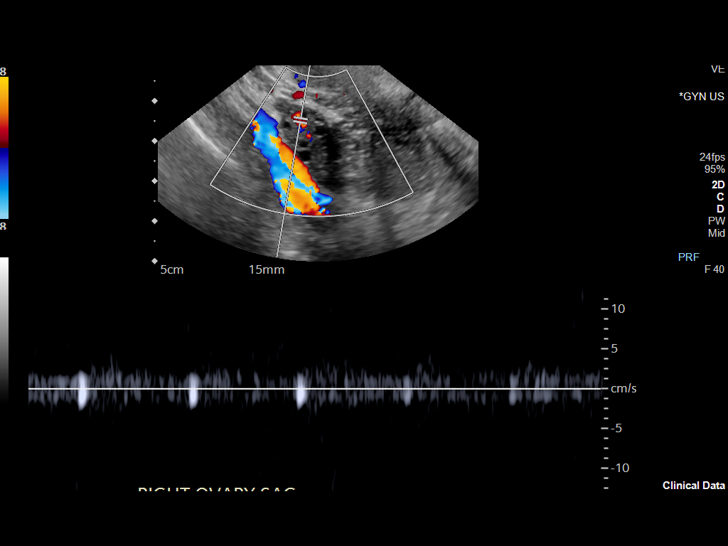
[im 54/72]
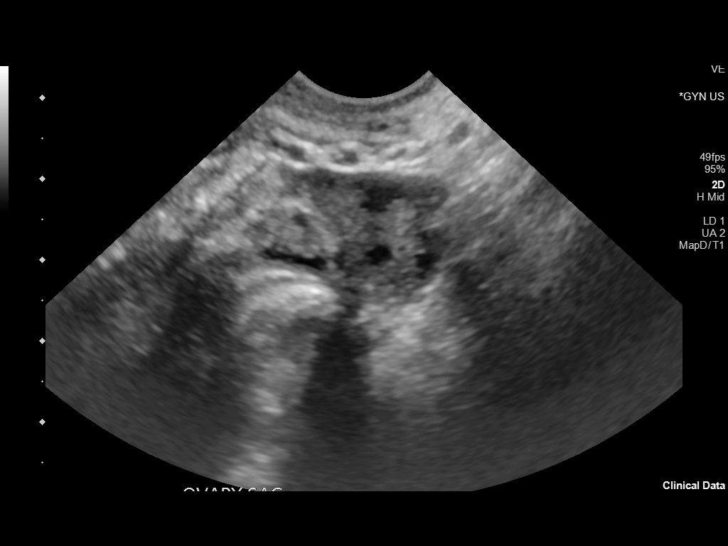
[im 60/72]
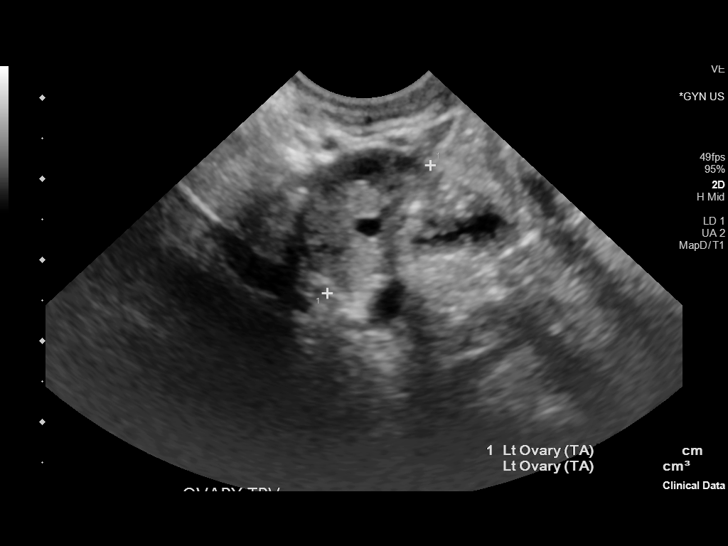
[im 66/72]
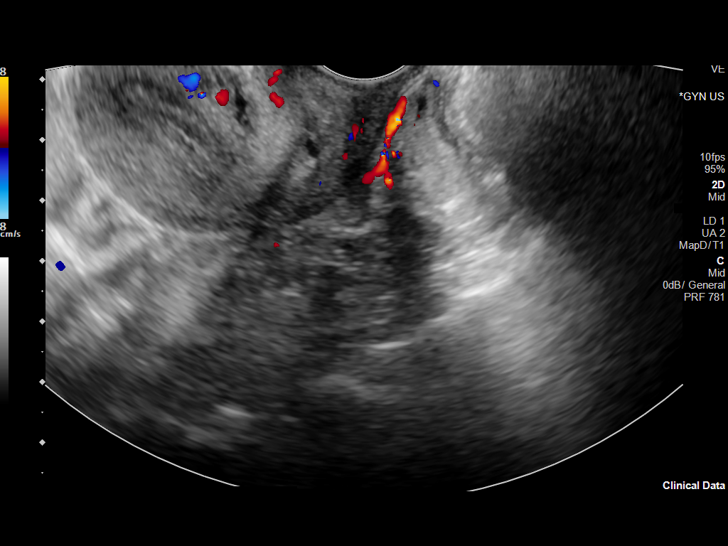
[im 72/72]
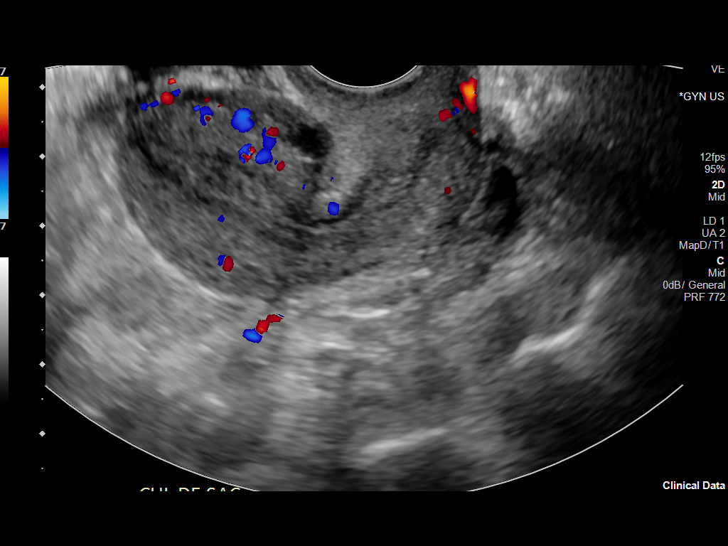

[13 of 25 positions shown; findings below may reference images not displayed]

FINDINGS: Uterus

Measurements: 6.1 x 2.9 x 4.3 cm = volume: 40 mL. No fibroids or
other mass visualized.

Endometrium

Thickness: 7 mm.  No focal abnormality visualized.

Right ovary

Measurements: 2.4 x 1.7 x 2.7 cm = volume: 6 mL. Normal
appearance/no adnexal mass.

Left ovary

Measurements: 2.3 x 2 x 2 cm = volume: 5 mL. Normal appearance/no
adnexal mass.

Pulsed Doppler evaluation of both ovaries demonstrates normal
low-resistance arterial and venous waveforms.

Other findings

Trace free fluid.
IMPRESSION: Trace free fluid in the pelvis. Otherwise negative pelvic
ultrasound.

## 2019-12-20 ENCOUNTER — Encounter (INDEPENDENT_AMBULATORY_CARE_PROVIDER_SITE_OTHER): Payer: Self-pay | Admitting: Student in an Organized Health Care Education/Training Program

## 2019-12-26 ENCOUNTER — Telehealth (INDEPENDENT_AMBULATORY_CARE_PROVIDER_SITE_OTHER): Payer: Self-pay | Admitting: Family

## 2019-12-26 ENCOUNTER — Other Ambulatory Visit (INDEPENDENT_AMBULATORY_CARE_PROVIDER_SITE_OTHER): Payer: Self-pay | Admitting: Family

## 2019-12-26 DIAGNOSIS — R0602 Shortness of breath: Secondary | ICD-10-CM

## 2019-12-26 MED ORDER — ALBUTEROL SULFATE 0.63 MG/3ML IN NEBU: INHALATION_SOLUTION | RESPIRATORY_TRACT | 5 refills | Status: DC

## 2019-12-26 MED ORDER — ALBUTEROL SULFATE 0.63 MG/3ML IN NEBU: 1.0000 | INHALATION_SOLUTION | Freq: Three times a day (TID) | RESPIRATORY_TRACT | 12 refills | Status: DC | PRN

## 2019-12-26 NOTE — Telephone Encounter (Signed)
Mom contacted me to report that Laura Parrish is feeling poorly, feels that she is unable to take a deep breath and is unusually tired. She asked to receive IV fluids by portacath again tomorrow as an extra day and I agreed. Mom also asked me to send a letter to her school requesting that she be allowed to take examinations online tomorrow because of her current condition. She asked that I send the letter to her disability counselor @ [email or web address]. I will send the letter as requested. TG

## 2019-12-26 NOTE — Telephone Encounter (Signed)
I received a call from [name] home health nurse while she was in patient's home. Laura Parrish describes feeling short of breath, or that she is unable to breathe deeply. She has no fever or signs of illness. O2 sats 98-99% on room air. She has tried Albuterol inhaler without relief. I consulted with Dr Damita Lack (pulmonology) who recommended switching to albuterol nebulizer. I sent in order for that to [facility]. TG

## 2020-01-12 ENCOUNTER — Other Ambulatory Visit (INDEPENDENT_AMBULATORY_CARE_PROVIDER_SITE_OTHER): Payer: Self-pay | Admitting: "Endocrinology

## 2020-01-12 DIAGNOSIS — E063 Autoimmune thyroiditis: Secondary | ICD-10-CM

## 2020-01-19 ENCOUNTER — Encounter (INDEPENDENT_AMBULATORY_CARE_PROVIDER_SITE_OTHER): Payer: Self-pay

## 2020-01-19 NOTE — Progress Notes (Signed)
  Form for [facility] [employer] Medical Assessment sent by mother for completion with instructions to return to [place] Coordinator with Lucky at fax [phone]  Phone [phone] email [email or web address] Form completed by Dr. Artis Flock signed and faxed. Copy made - original to batch scan. Requested to include medication and diagnosis list-    .

## 2020-01-27 ENCOUNTER — Encounter (INDEPENDENT_AMBULATORY_CARE_PROVIDER_SITE_OTHER): Payer: Self-pay

## 2020-02-06 ENCOUNTER — Telehealth (INDEPENDENT_AMBULATORY_CARE_PROVIDER_SITE_OTHER): Payer: Self-pay | Admitting: Family

## 2020-02-06 NOTE — Telephone Encounter (Signed)
I received the paperwork, completed it and returned to [facility]. TG

## 2020-02-06 NOTE — Telephone Encounter (Signed)
I called and told Laura Parrish that I have not received a form to sign. She will re-fax it. TG

## 2020-02-06 NOTE — Telephone Encounter (Signed)
  Who's calling (name and relationship to patient) : [name] with [facility]  Best contact number: [phone]  Provider they see: [name]  Reason for call: Checking status of paperwork    PRESCRIPTION REFILL ONLY  Name of prescription:  Pharmacy:

## 2020-02-08 ENCOUNTER — Other Ambulatory Visit (INDEPENDENT_AMBULATORY_CARE_PROVIDER_SITE_OTHER): Payer: Self-pay | Admitting: Pediatrics

## 2020-02-23 ENCOUNTER — Ambulatory Visit (INDEPENDENT_AMBULATORY_CARE_PROVIDER_SITE_OTHER): Payer: Commercial Managed Care - PPO

## 2020-02-23 ENCOUNTER — Other Ambulatory Visit: Payer: Self-pay

## 2020-02-23 ENCOUNTER — Encounter (INDEPENDENT_AMBULATORY_CARE_PROVIDER_SITE_OTHER): Payer: Self-pay | Admitting: Pediatrics

## 2020-02-23 ENCOUNTER — Ambulatory Visit (INDEPENDENT_AMBULATORY_CARE_PROVIDER_SITE_OTHER): Payer: Commercial Managed Care - PPO | Admitting: Pediatrics

## 2020-02-23 VITALS — BP 108/68 | HR 86 | Temp 98.6°F | Ht 64.0 in | Wt 112.6 lb

## 2020-02-23 DIAGNOSIS — G43009 Migraine without aura, not intractable, without status migrainosus: Secondary | ICD-10-CM

## 2020-02-23 DIAGNOSIS — G935 Compression of brain: Secondary | ICD-10-CM

## 2020-02-23 DIAGNOSIS — Z95828 Presence of other vascular implants and grafts: Secondary | ICD-10-CM

## 2020-02-23 DIAGNOSIS — R0602 Shortness of breath: Secondary | ICD-10-CM

## 2020-02-23 DIAGNOSIS — Q6689 Other  specified congenital deformities of feet: Secondary | ICD-10-CM

## 2020-02-23 DIAGNOSIS — E8840 Mitochondrial metabolism disorder, unspecified: Secondary | ICD-10-CM

## 2020-02-23 DIAGNOSIS — Z9189 Other specified personal risk factors, not elsewhere classified: Secondary | ICD-10-CM

## 2020-02-23 DIAGNOSIS — Z7189 Other specified counseling: Secondary | ICD-10-CM

## 2020-02-23 MED ORDER — TOPIRAMATE 25 MG PO TABS
ORAL_TABLET | ORAL | 3 refills | Status: DC
Start: 2020-02-23 — End: 2020-04-24

## 2020-02-23 MED ORDER — RIZATRIPTAN BENZOATE 10 MG PO TABS
10.0000 mg | ORAL_TABLET | ORAL | 3 refills | Status: DC | PRN
Start: 2020-02-23 — End: 2020-10-12

## 2020-02-23 MED ORDER — PYRIDOSTIGMINE BROMIDE 60 MG PO TABS
60.0000 mg | ORAL_TABLET | Freq: Four times a day (QID) | ORAL | 5 refills | Status: DC
Start: 2020-02-23 — End: 2020-07-26

## 2020-02-23 MED ORDER — PREGABALIN 25 MG PO CAPS
25.0000 mg | ORAL_CAPSULE | Freq: Two times a day (BID) | ORAL | 5 refills | Status: DC
Start: 2020-02-23 — End: 2020-07-26

## 2020-02-23 MED ORDER — METAXALONE 400 MG PO TABS
400.0000 mg | ORAL_TABLET | Freq: Every day | ORAL | 3 refills | Status: DC | PRN
Start: 2020-02-23 — End: 2020-05-04

## 2020-02-23 MED ORDER — PREGABALIN 75 MG PO CAPS: ORAL_CAPSULE | ORAL | 5 refills | Status: DC

## 2020-02-23 NOTE — Patient Instructions (Signed)
   All medications refilled  I will follow-up with [facility] for the EMG  Keep appointments with your other specialists including Dr Fransico Michael and Dr Dalbert Garnet  The mass in your neck is a tight sternocleidomastoid muscle.  I recommend stretching, using your skelaxin, and considering a session of PT to learn exercises.    Work on your sleep routine- including taking melatonin 1-2 hours before you want to fall asleep, staying out of bed and ideally out of your room EXCEPT when it is time for sleep, avoid screens 1-2 hours before bedtime. wake up at a set time in the morning and turn on lights, screen time in the morning is good. Naps are ok but ideally not longer than 30 minutes, and not after about 4pm.    Be sure to eat protein about 1-2 hours before bed.    DMV paperwork completed today

## 2020-02-23 NOTE — Progress Notes (Signed)
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 Left neck pain, tight muscle per Dr. Artis Flock and needs to stretch muscles Received forms for driving  G tube (size 99B 3.5cm low profile Mic-Key button) & Port a cath 20g 0.75" SafeStep huber needle only  Brief History:  Laura Parrish is a young woman with complicated history including Chairi malformation s/p repair, tethered cord s/p repair, feeding intolerance s/p gastrostomy tube, lactose intolerance and alpha gal allergy,  multiple hormonal irregularities including hypothyroidism, chronic headaches, chronic Gi complaints, neuropathic pain in her feet,  and multiple other symptoms concerning for mitochondrial diease although genetic testing and muscle biopsy unrevealing. She is undergoing testing for suspected myasthenia gravis and has responded well to treatment with Mestinon. We also have growing concern for connective tissue disease, however this has not been diagnostically proven either. She now receives fluid infusion of D5 1/2 NS at 250cc/hr for 12 hrs via portacath every 2 weeks for energy failure and related to inability to tolerate enteral fluids, and has been much more stable with these infusions.    Baseline Function:  Cognitive -age appropriate responses to questions, attention and comprehension appropriate  Neurologic - cognitively normal, has chronic fatigue, has intermittent headaches, chronic leg pain  Endocrine - has hypothyroidism and abnormal menses  Communication -fluent speech  Cardiovascular -normal- port a cath in place to receive medications and fluids as needed (05/27/2018)  Vision -normal, however has regular screening  Hearing -normal  Pulmonary - history of asthma  GI - Feeding intolerance with variable diarrhea and constipation, has a feeding tube in place  Urinary -normal  Motor -normal function, although low  endurance.   Guardians/Caregivers: Catarina Huntley (mother) ph [phone] Karilynn Carranza (father) ph [phone]  Care Needs/Upcoming Plan:  CAP-C ends age 34. Applying for CAP-DA- Denied by [employer] Waiver services  Needs referral to [facility] for single fiber EMG to confirm diagnosis of myasthenia gravis  Feeding: Last updated: 11/24/2019 DME: [employer]-fax [phone] Formula: Ensure Clear  Current regimen:  Day feeds: PO foods Overnight feeds: 720 mL (3 cans) + 120 ml of water @ 85 mL/hr x 10-11 hours  FWF:   Notes: Pt receives oral foods during the day, but relies of enteral nutrition overnight to meet nutritional needs. Supplements: MVI + iron, Vitamin B2, Co-Q10, and Calcium Goal to add Ensure clear, and take 32oz water in the few days leading up to infusions  Symptom management/Treatments:  Neurological - Topiramate for migraine prevention, Imitrex Ondansetron and Ibuprofen for rescue; Lyrica for neuropathic pain in her legs; Skelaxin for body pain; Fluid infusions every 2 weeks; needs frequent rest periods, IV fluids with D10 1/2 NS at 125 ml/hr x 24 hrs or 250 ml/hr x 12 hrs.   Endocrine - on Synthroid for hypothyroidism  Pulmonary - Astelin nasal spray, Xyzal, albuterol nebulizer ([employer])- works better than inhaler  GI - Cromolyn, Ensure supplements, Miralax for constipation  Musculoskeletal- Mestinon for weakness- 3 x a day cannot tolerate 4 x a day = diarrhea, pain patches for neck tightness, Skelaxin  TMJ- seeing ortho and dentist  Dietary- constipation, jittery in the mornings- suggested eating protein source at 10 pm  Past/failed meds:  Periactin caused severe fatigue and failed to control headaches  Gabapentin less helpful for neuropathic pain  Zomig - ineffective  Maxalt - intermittently helpful  Skelaxin helpful at times but causes sleepiness  Providers:  Verdene Lennert, MD (PCP) [facility] Internal  Medicine)- ph. [phone] Fax  [phone]  [name], MD [facility] Child Neurology and Pediatric Complex Care) ph [phone] fax [phone]  Annabelle Harman, RD [facility] Pediatric Complex Care dietitian) ph [phone] fax [phone]  [name] NP-C [facility] Pediatric Complex Care) ph [phone] fax [phone]  [name] Essex, MD [facility] Cardiology) ph [phone] fax [phone]  [facility] Surgery-ph. [phone] fax [phone] -portacath and G tube  Truitt Merle, MD [facility] Gastroenterology) ph. [phone] fax-[phone]  Dennie Fetters MD ([facility]) ph. [phone] fax [phone]  Molli Knock, MD [facility] Pediatric Endocrinology) ph [phone] fax [phone]  [facility] Dermatology Ph [phone] Fax- [phone].   Christeen Douglas, MD [facility] Gynecology) ph [phone] fax [phone] (referred - not yet seen)   Rutherford Limerick, MD ([facility] Mitochondrial Specialist, [place], [place]) [place] [phone] fax [phone]  Vira Blanco, MD [facility] Otolaryngology) ph. [phone] Fax [phone]  Community support/services:  [facility] [phone] fax [phone] home health nursing by [name]; portacath and infusion supplies  CAP-C thorugh Jake Seats at [place] [phone] Mother is CAP-C aid. Mom is her personal caregiver through Doctor, general practice  [employer] - albuterol + nebulizer  Equipment:  [facility]: [phone] fax: [phone]-  G-tube supplies, Port A Cath supplies, CADD pump for IV fluids, Feeding pump, Pulse ox, IV fluids and Pediasure  [employer]: ph. [phone] fax: [phone] wheelchair, hospital bed  Goals of care: Attempting all typical milestones of adulthood, with modifications for activity level  Advanced care planning: Full Code  Psychosocial: Lives with parents and 2 siblings, has driver's  license, attends community college part time  Diagnostics/Screenings:  Radiology - Brain MRI 4/08: Sequelae of suboccipital decompression for Chiari I malformation with normal CSF flow. 2. Cervical spine syrinx, more completely evaluated on dedicated MR of the cervical spine, reported separately. Repeat 7/09: unchanged CSpine MRI 4/08: syrinx is again seen spanning the C5 to T1 levels . Repeat 1/10: unchanged. EMG/NCV Normal study.  11/22/09 SSEP These lower extremity somatosensory evoked potentials are at least borderline abnormal for an increased absolute latency of the P37 responses and the L1 - P37 interpeak latencies with both left and right posterior tibial nerve stimulation. NCV/EMGs 2008: Borderline study: no electrodiagnostic evidence of sensory or motor neuropathy. Abnormal peroneal nerve responses & F-wave abnormalities are of unclear significance but could represent mild peroneal mononeuropathies of the branch to the EDB muscle bilaterally.  Muscle bx 2009: Skeletal muscle, right leg, biopsy Mild variation in fiber size, more notable in Type 1 fibers with rare atrophic Type 1 fibers EM: The normal sarcomeric architecture is preserved. Mitochondria are morphologically unremarkable. No abnormal accumulations of glycogen or lipid are seen. There are scattered mildly dilated structures between fibrils which may represent sarcotubular profiles EEG: normal awake and asleep (2010) PSG: normal sleep.  06/16/2019- Field Vision Test- normal, Normal inner and outer retina OU  06/23/2019 EMG test Brenners-I Diminished fibular motor responses possibly due to hx of tethered cord, chronic bracing and is a nonspecific finding but no evidence of myopathy or generalized neuropathy.  11/01/2019 Otolaryngology: TMJ,              Audiology exam LEFT ear:Thresholds consistent with normal hearing. Speech testing gave an SRT of 5 dBHL and WRS of 100% presented at 50 dBHL,  using a NU-6 wordlist.            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  Speech testing gave an SRT of 10 dBHL and WRS of 100%                               presented at 50 dBHL, using a NU-6 wordlist.   12/01/2019 Thyroid Ultrasound IMPRESSION: Small heterogenous thyroid. No indication for biopsy or dedicated imaging follow-up.  12/19/2019 Pelvic Ultrasound IMPRESSION:Trace free fluid in the pelvis. Otherwise negative pelvic ultrasound.  [name] NP-C and [name], MD Pediatric Complex Care Program Ph: [phone] Fax: [phone]

## 2020-02-23 NOTE — Progress Notes (Signed)
Patient: Laura Parrish MRN: [id number] Sex: female DOB: [birth year]  Provider: [name], MD Location of Care: Pediatric Specialist- Pediatric Complex Care Note type: Routine return visit  History of Present Illness: Referral Source: [name], MD History from: patient and prior records Chief Complaint: complex care  Laura Parrish is a 20 y.o. female with history of Chairi malformation s/p repair, tethered cord s/p repair, feeding intolerance s/p gastrostomy tube, lactose intolerance and alpha gal allergy, multiple hormonal irregularities including hypothyroidism, chronic headaches, chronicGIcomplaints, neuropathic pain in her feet, and symptoms concerning for mitochondrial dieasealthough genetic testing and muscle biopsy unrevealing who I am seeing in follow-up for complex care management. Patient was last seen 11/17/19. Since that appointment,  patient has had no ED visits or hospital admissions.   Patient presents today with mother .  Symptom management:  SOB: Improved with breathing treatments. Taking three times mestinon becuase four times gives her stomach pain. Inhalers do not work. Reports better breathing on steroids.   Strain when talking. Also reporting pain in her neck. No ear drainage or allergies. no changes on prednisone. Sometimes painful. Must be propped up to sleep.   Menstruation: No breakthrough bleeding. No period since November. Underwent ultrasound. Waiting to reschedule with OBGYN.   Headaches: Have improved. About once every two weeks. Worse when it is cold outside. Prefers Maxalt over Imitrex. Imitrex does not resolve her headaches. Sometime takes Phenergan when she is nauseous.   Leg pain: Going "surprisingly well"   Feeding: Drinking ensure and having 3 meals a day.  Sleep: Has not been sleeping well. Problem getting to sleep; does not feel tired. Goes into her room at 8pm but does not sleep until 10-11:00pm. During that time she is reading and  watching TV in bed. Easily wakes up through the night and goes back to sleep. Reports daytime sleepiness. Naps during the day for 2-3 hours. Has used melatonin in the past but does not work for over two days. Sometimes wakes up with the "shakes".   Care coordination (other providers): Waiting to OB/GYN  Care management needs:  Has personal care aide 30 hours a week.   Equipment needs: No needs at this time.   Decision making/Advanced care planning:  Past Medical History Past Medical History:  Diagnosis Date  . Allergy to alpha-gal    elevated IgE 04/10/16  . Autonomic dysfunction   . Chiari I malformation (HCC)   . Complication of anesthesia    slow to wake up  . Dyspnea   . Eczema   . GERD (gastroesophageal reflux disease)   . Headache   . Heart murmur   . Hypoglycemia   . Hypothyroid   . IBS (irritable bowel syndrome)   . Joint pain   . Mitochondrial disease (HCC)   . Muscle pain   . Pneumonia    several times  . PONV (postoperative nausea and vomiting)     Surgical History Past Surgical History:  Procedure Laterality Date  . ADENOIDECTOMY     2004or 2005  . chiari decompression  2007  . GALLBLADDER SURGERY  2015  . GASTROSTOMY TUBE PLACEMENT     x 2  . MUSCLE BIOPSY  2010  . Port Replacement  05/2018   x 2 orignal 08/2016 then replacement 05/2018  . PORT-A-CATH REMOVAL N/A 03/24/2019   Procedure: PORT-A-CATH REMOVAL;  Surgeon: Sheliah Hatch De Blanch, MD;  Location: [facility] OR;  Service: General;  Laterality: N/A;  . PORTACATH PLACEMENT Right 03/24/2019  Procedure: PORT-A-CATH INSERTION under ultrasound and fluoroscopic guidance;  Surgeon: Kinsinger, De Blanch, MD;  [facility] OR;  Service: General;  Laterality: Right;  . REMOVAL OF GASTROSTOMY TUBE N/A 03/24/2019   Procedure: Exchange of Gastrostomy Tube;  Surgeon: Rodman Pickle, MD;  Location[facility] OR;  Service: General;  Laterality: N/A;  . tethered cord  07/2006  . TONSILLECTOMY     [id number]    Family  History family history includes Allergic rhinitis in her brother, maternal grandmother, mother, and sister; Asthma in her brother, maternal grandmother, and mother; Diabetes type II in her maternal grandmother; Heart murmur in her mother; Lung cancer in her maternal grandfather; Mitochondrial disorder in her brother; Seizures in her brother.   Social History Social History   Social History Narrative   Laura Parrish graduated from [employer]. Will attend [employer].    She enjoys reading, playing with her cat and her dog, and hanging out with her sister.     Allergies Allergies  Allergen Reactions  . Other Anaphylaxis    Alpha Gal allergy \ Red meat\ any hoof animal  . Latex Rash  . Lactated Ringers     Contraindicated w/ mitochondrial syndrome  . Propofol Other (See Comments)    Due to possible mitochondrial disorder Due to possible mitochondrial disorder Due to possible mitochondrial disorder  . Tape Itching and Other (See Comments)    Medications Current Outpatient Medications on File Prior to Visit  Medication Sig Dispense Refill  . albuterol (ACCUNEB) 0.63 MG/3ML nebulizer solution Take 58ml by nebulizer 3 times per day as needed for shortness of breath 270 mL 5  . albuterol (PROVENTIL HFA) 108 (90 Base) MCG/ACT inhaler Inhale 2 puffs into the lungs every 4 (four) hours as needed for wheezing or shortness of breath. (Patient not taking: Reported on 11/17/2019) 6.7 g 2  . ascorbic acid (VITAMIN C) 500 MG tablet Take 500 mg by mouth daily.     Marland Kitchen azelastine (ASTELIN) 0.1 % nasal spray Place 2 sprays into both nostrils 2 (two) times daily. Use in each nostril as directed (Patient not taking: Reported on 11/17/2019) 30 mL 5  . clobetasol (OLUX) 0.05 % topical foam Apply topically 2 (two) times daily. Apply to affected areas twice daily as needed taking care to avoid axillae and groin area. (Patient not taking: Reported on 11/28/2019) 45 g 3  . Coenzyme Q-10 200 MG CAPS  Take 1 capsule by mouth daily.  (Patient not taking: Reported on 11/28/2019)    . cromolyn (GASTROCROM) 100 MG/5ML solution Take 5 mLs (100 mg total) by mouth 4 (four) times daily -  before meals and at bedtime. (Patient not taking: Reported on 11/17/2019) 600 mL 12  . EPINEPHrine 0.3 mg/0.3 mL IJ SOAJ injection Inject 0.3 mg into the muscle once. As needed (Patient not taking: Reported on 11/28/2019)    . lansoprazole (PREVACID) 30 MG capsule TAKE 1 CAPSULE BY MOUTH DAILY 90 capsule 2  . levOCARNitine (CARNITOR) 1 GM/10ML solution TAKE 2 TEASPOONFUL ( ) BY MOUTH TWICEDAILY (Patient taking differently: Place 1,000 mg into feeding tube daily. ) 118 mL 12  . lidocaine (XYLOCAINE) 5 % ointment Apply 1 application topically as needed. (Patient not taking: Reported on 11/28/2019) 35.44 g 3  . Nutritional Supplements (ENSURE CLEAR) LIQD 474 mLs by Gastrostomy Tube route daily. (Patient taking differently: 474 mLs by Gastrostomy Tube route 4 (four) times daily. During the day  By mouth or G tube) 60 Bottle 5  . Nutritional Supplements (  FEEDING SUPPLEMENT, VITAL 1.5 CAL,) LIQD Place 1,000 mLs into feeding tube continuous. bedtime (Patient not taking: Reported on 11/28/2019)    . ondansetron (ZOFRAN-ODT) 4 MG disintegrating tablet Take 1 tablet under the tongue every 6-8 hours as needed for nausea. (Patient not taking: Reported on 11/28/2019) 30 tablet 5  . PRESCRIPTION MEDICATION Apply 1 application topically as needed (port). Elma- cream     . promethazine (PHENERGAN) 25 MG tablet Take 1 tablet at onset of migraine. May repeat every 6 hours PRN (Patient taking differently: Take 25 mg by mouth every 6 (six) hours as needed for nausea (migraine.). ) 30 tablet 0  . SYNTHROID 88 MCG tablet TAKE 1 TABLET BY MOUTH EVERY MORNING ON AN EMPTY STOMACH 90 tablet 1   No current facility-administered medications on file prior to visit.   The medication list was reviewed and reconciled. All changes or newly prescribed  medications were explained.  A complete medication list was provided to the patient/caregiver.  Physical Exam BP 108/68   Pulse 86   Temp 98.6 F (37 C) (Temporal)   Ht 5\' 4"  (1.626 m)   Wt 112 lb 9.6 oz (51.1 kg)   SpO2 100%   BMI 19.33 kg/m  Weight for age: Facility age limit for growth percentiles is 20 years.  Length for age: Facility age limit for growth percentiles is 20 years. BMI: Body mass index is 19.33 kg/m. No exam data present General: NAD, well nourished  HEENT: normocephalic, no eye or nose discharge.  MMM  Cardiovascular: warm and well perfused Lungs: Normal work of breathing, no rhonchi or stridor Skin: No birthmarks, no skin breakdown Abdomen: soft, non tender, non distended Extremities: No contractures or edema. Neuro: EOM intact, face symmetric. Moves all extremities equally and at least antigravity. No abnormal movements. Normal gait.    Diagnosis:  1. Shortness of breath   2. Migraine without aura and without status migrainosus, not intractable   3. At risk for dehydration   4. Port-A-Cath in place   5. Chiari I malformation (HCC)   6. Mitochondrial disease (HCC)   7. Clubfoot, congenital      Assessment and Plan Laura Parrish is a 20 y.o. female with history of  Chairi malformation s/p repair, tethered cord s/p repair, feeding intolerance s/p gastrostomy tube, lactose intolerance and alpha gal allergy, multiple hormonal irregularities including hypothyroidism, chronic headaches, chronicGIcomplaints, neuropathic pain in her feet, and symptoms concerning for mitochondrial dieasealthough genetic testing and muscle biopsy unrevealing who presents for follow-up in the pediatric complex care clinic. Patient is doing well. On examination there was tightness in her sternocleidomastoid muscle. I recommend PT to work on stretching and also taking skelaxtin which can also help lossen the muscle. We discussed sleep hygiene such as reducing the length of naps and  creating a strict sleep schedule.  I also encouraged doing other activities outside of her room and to avoid watching tv and reading in her bed. Recommend 3-5 mg of melatonin 1-2 hours before bed and avoiding screens during that time. Recommend having protein before bed to help maintain blood sugar levels.  Patient seen by case manager, dietician, integrated behavioral health today as well, please see accompanying notes.  I discussed case with all involved parties for coordination of care and recommend patient follow their instructions as below.   Symptom management:   All medications refilled  Mass in neck is a tight sternocleidomastoid muscle, this is a new diagnosis.  I recommend stretching, using  skelaxin,  and considering a session of PT to learn exercises.    Advised Aretha to work on her sleep routine- including taking melatonin 1-2 hours before you want to fall asleep, staying out of bed and ideally out of your room EXCEPT when it is time for sleep, avoid screens 1-2 hours before bedtime. wake up at a set time in the morning and turn on lights, screen time in the morning is good. Naps are ok but ideally not longer than 30 minutes, and not after about 4pm.    Recommend protein about 1-2 hours before bed to avoid shakiness overnight  DMV paperwork completed today  Care coordination:  I will follow-up with [facility] for the EMG  Keep appointments with your other specialists including Dr Fransico Michael and Dr Dalbert Garnet    I spend 40 minutes on day of service on this patient including discussion with patient and family, coordination with other providers, and review of chart  The CARE PLAN for reviewed and revised to represent the changes above.  This is available in Epic under snapshot, and a physical binder provided to the patient, that can be used for anyone providing care for the patient.    Return in about 3 months (around 05/22/2020).  [name] MD MPH Neurology,  Neurodevelopment and  Neuropalliative care [facility] Child Neurology  [address] [place], [place], [place] [address] Phone: [phone]  By signing below, I, Denyce Robert attest that this documentation has been prepared under the direction of [name], MD.    I, [name], MD personally performed the services described in this documentation. All medical record entries made by the scribe were at my direction. I have reviewed the chart and agree that the record reflects my personal performance and is accurate and complete Electronically signed by Denyce Robert and [name], MD 03/07/20 7:19 PM

## 2020-02-29 ENCOUNTER — Other Ambulatory Visit: Payer: Self-pay | Admitting: Pediatrics

## 2020-03-12 ENCOUNTER — Other Ambulatory Visit (INDEPENDENT_AMBULATORY_CARE_PROVIDER_SITE_OTHER): Payer: Self-pay | Admitting: Pediatrics

## 2020-03-12 DIAGNOSIS — G43009 Migraine without aura, not intractable, without status migrainosus: Secondary | ICD-10-CM

## 2020-03-20 ENCOUNTER — Telehealth (INDEPENDENT_AMBULATORY_CARE_PROVIDER_SITE_OTHER): Payer: Self-pay | Admitting: Family

## 2020-03-20 DIAGNOSIS — R638 Other symptoms and signs concerning food and fluid intake: Secondary | ICD-10-CM

## 2020-03-20 DIAGNOSIS — Z95828 Presence of other vascular implants and grafts: Secondary | ICD-10-CM

## 2020-03-20 DIAGNOSIS — E8840 Mitochondrial metabolism disorder, unspecified: Secondary | ICD-10-CM

## 2020-03-20 DIAGNOSIS — Z9189 Other specified personal risk factors, not elsewhere classified: Secondary | ICD-10-CM

## 2020-03-20 DIAGNOSIS — E860 Dehydration: Secondary | ICD-10-CM

## 2020-03-20 NOTE — Telephone Encounter (Signed)
Routed to Neuro. 

## 2020-03-20 NOTE — Telephone Encounter (Signed)
I called and spoke with [name]. She said that insurance was now requiring liters to be dispensed on the order. I will put in new order and email to her. TG

## 2020-03-20 NOTE — Telephone Encounter (Signed)
     Who's calling (name and relationship to patient) : [name] [facility])  Best contact number: [phone]  Provider they see: [name]  Reason for call:  [facility] called in needing clarification for Laura Parrish's fluid orders. Please advise   Call ID:      PRESCRIPTION REFILL ONLY  Name of prescription:  Pharmacy:

## 2020-03-22 ENCOUNTER — Encounter (INDEPENDENT_AMBULATORY_CARE_PROVIDER_SITE_OTHER): Payer: Self-pay | Admitting: Family

## 2020-03-27 ENCOUNTER — Ambulatory Visit (INDEPENDENT_AMBULATORY_CARE_PROVIDER_SITE_OTHER): Payer: Commercial Managed Care - PPO | Admitting: "Endocrinology

## 2020-04-09 ENCOUNTER — Other Ambulatory Visit (INDEPENDENT_AMBULATORY_CARE_PROVIDER_SITE_OTHER): Payer: Self-pay | Admitting: Pediatrics

## 2020-04-09 DIAGNOSIS — G43009 Migraine without aura, not intractable, without status migrainosus: Secondary | ICD-10-CM

## 2020-04-21 LAB — CBC WITH DIFFERENTIAL/PLATELET
Absolute Monocytes: 459 cells/uL (ref 200–950)
Basophils Absolute: 82 cells/uL (ref 0–200)
Basophils Relative: 1 %
Eosinophils Absolute: 435 cells/uL (ref 15–500)
Eosinophils Relative: 5.3 %
HCT: 44 % (ref 35.0–45.0)
Hemoglobin: 14.5 g/dL (ref 11.7–15.5)
Lymphs Abs: 3485 cells/uL (ref 850–3900)
MCH: 28.5 pg (ref 27.0–33.0)
MCHC: 33 g/dL (ref 32.0–36.0)
MCV: 86.6 fL (ref 80.0–100.0)
MPV: 10.2 fL (ref 7.5–12.5)
Monocytes Relative: 5.6 %
Neutro Abs: 3739 cells/uL (ref 1500–7800)
Neutrophils Relative %: 45.6 %
Platelets: 428 10*3/uL — ABNORMAL HIGH (ref 140–400)
RBC: 5.08 10*6/uL (ref 3.80–5.10)
RDW: 12.1 % (ref 11.0–15.0)
Total Lymphocyte: 42.5 %
WBC: 8.2 10*3/uL (ref 3.8–10.8)

## 2020-04-21 LAB — TSH: TSH: 1.14 mIU/L

## 2020-04-21 LAB — T3, FREE: T3, Free: 2.9 pg/mL — ABNORMAL LOW (ref 3.0–4.7)

## 2020-04-21 LAB — T4, FREE: Free T4: 1.4 ng/dL (ref 0.8–1.4)

## 2020-04-22 NOTE — Progress Notes (Signed)
Subjective:  Subjective  Patient Name: Laura Parrish Date of Birth: Dec 10, 2000  MRN: [id number]  Laura Parrish  presents for her clinic visit today for follow up evaluation and management of her acquired hypothyroidism due to Hashimoto's thyroiditis and many other health problems.   HISTORY OF PRESENT ILLNESS:   Laura Parrish is a 20 y.o. Caucasian young lady.   Laura Parrish was accompanied by her mother  1. Laura Parrish's initial pediatric endocrine clinic visit here at [facility] occurred on 01/29/18. The following information is a composite of the history gained at her initial visit, information gained after 7 hours of reviewing her records in [place], and information gleaned on 02/05/18:   A. Laura Parrish has had a very complicated medical and surgical history. She has been hospitalized many times during her life. Her problem list includes the following:    1). Chronic GI problems, with frequent constipation, frequent diarrhea, BMs occurring immediately after eating, nausea, abdominal pains, and feeding difficulties requiring the use of both oral and nocturnal G-tube feedings: Previous EGD and colonoscopy have been unremarkable. TTG IgA was negative on 12/29/17.    2). Chronic allergies, including seasonal allergies, allergies to other environmental agents, latex, Propofol, fish and shellfish according to rashes but not to testing, and Alpha-Gal allergy with a positive result, but not specifically beef, pork, or lamb;   3). Chronic fatigue   4). Iron deficiency anemia   5). Hypothyroidism with elevated TPO and thyroglobulin antibodies, c/w Hashimoto's thyroiditis;       6). Muscle weakness and myalgias   7). 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 Family was offered treatment with Florinef, but declined because their pharmacist advised against starting Florinef due to possible adverse effects.     8). Intermittent dehydration requiring  administration of iv fluids via her port-a-cath every 6 weeks, but more recently every 2 weeks   9). Chronic headaches, to include migraines             10). 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 Chronic swelling of her hands and feet   13). Chronic pain of her back, joints, legs, and feet   14). Skin rashes, hives, eczema, and flushing   15). Irregular menses and menorrhagia   16). Nocturnal hypoglycemia if she does not have nocturnal pump feedings.    17). Chronic illness with low grade fevers, sometimes up to 101, but mostly 99-100 degrees   18). 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 However, her testing did not support that hypothesis. Mom has the same genetic variant, but not all of the same problems.   B. Perinatal history: Gestational Age: [redacted]w[redacted]d; 5 lb 7 oz (2.466 kg); Healthy newborn, except noted to have a right club foot deformity.    C. Infancy: She had to be hospitalized several times for unexplained dehydration. She had club foot surgery at about 36 weeks of age.   D. Childhood:   1). She was sick a lot.    2). She had PT for her club foot.    3). A Chiari malformation was discovered at age 65. She had decompression surgery in 2007.  4). In 2008 she was discovered at [name] to have a tethered cord. She had a release procedure in 2008.   5). In 2010 she had a muscle BX that showed mild variation in fiber size, more notable in Type I fibers with rare atrophic Type I fibers. EM showed that the normal sarcomeric architecture was preserved. Mitochondria were morphologically unremarkable. Dr. 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    6). In 2010 she continued to have severe problems with lack of appetite, nausea, and feeding difficulties. In late 2010 she had a G-tube placed.    7). Genetic testing was performed at [facility] on 09/02/08. Karyotype and chromosomal array were normal. 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 Mitochondrial DNA copy number in muscle tissue was normal, 123% of the mean value of the age and tissue tested controls.   8). On 10/18/12 Laura Parrish's TFTs were c/w primary hypothyroidism. Levothyroxine treatment was initiated.    9). She had a cholecystectomy in June 2015 for biliary hypokinesia.    10). A port-a-cath was placed in August 2018.    11). In December 2018 she saw a geneticist, Dr. Penni Bombard, in [place] for evaluation of a mitochondrial dysfunction. 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 Targeted GeneDX testing was performed in June-July 2019 and reportedly showed allergies in both Laura Parrish and her brother. Testing in Laura Parrish's brother also showed genetic tendencies for seizures and developmental delays.     E. Chief complaint:   1). Hypothyroidism was diagnosed on 18 October 2012 after several years of variable thyroid hormone test results. Her TSH was high 10.35 (ref 0.5-4.5) and free T4 was low at 0.67 (ref 0.80-2.0). TPO antibody and thyroglobulin antibody were both reportedly positive. She started thyroid hormone at about that time. She takes generic levothyroxine, 50 mcg/day. That dose has not been changed since it was started. She does not think that she has ever had thyroid swelling or pain. Mom said that Laura Parrish's TSH and free T4 values have been normal. However, her TSH was elevated again in February 2019.   2). Hypoglycemia: At about age 19 she had low BGs almost all the time. If she did not get G-tube feedings at night, the BGs in the mornings  could be in the 40s-50s. She had never been evaluated for adrenal insufficiency. If her BGs are low during the day she can get dizzy.   3). Dizziness: She has been dizzy for many years. The dizziness was originally ascribed to her Arnold-Chiari malformation, but did not improve after surgery. About 30% of her dizziness was associated with low BGs. Some of her dizziness was a spinning dizziness.  Some of her dizziness appears to be orthostatic dizziness. This latter dizziness occurs almost every morning when she sits up from a supine or prone position. The dizziness can also occur if she stands up too fast from a seated position. Whenever she gets dizzy for any reason, she often gets black spots in front of her eyes and feels like she will pass out.   4). Abnormal periods and excessive menstrual and intermenstrual bleeding: Menarche occurred at age 22. Periods have never been regular. When she took most OCPs she had persistent vaginal bleeding. Seasonale caused her to have menses every 3 months for about one year, then it became less effective. She continued to have heavy spotting. This problem has been sometimes attributed to a  connective tissue disorder.    5). 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 Her initial [facility] consultation occurred on 03/15/12.      1). The report of her 2009 muscle BX was reviewed. The genetics reports from [facility] done in July 2010 and from [place] in 2010 were reviewed. Also reviewed was a report from Dr. Salomon Fick of the [facility] who did not endorse support for a diagnosis of mitochondrial disease.      2). [facility] geneticist noted that none of Joycelin's previous testing supported a diagnosis of a mitochondrial disorder. The geneticist also stated that no specific genetic disorders are known to cause her constellation of symptoms shared by her and her younger brother, including Chiari I malformation. Genome sequencing testing,  to determine if River and her brother had a unique gene mutation underlying their presentations, was offered to the family. Unfortunately the costs of that testing were too high for the family to agree to the testing     B). Her [facility] record note from 10/15/15 shows that Takhia's previous tests for myopathy and mitochondrial function were all negative. Genetic whole exome testing was negative for a 20 y.o. female with Chiari I malformation, fatigue, muscle weakness, feeding difficulties, tethered cord, and clinical diagnosis of Ehlers-Danlos syndrome. GeneDX testing showed a variant in the mitochondrial genome of uncertain significance that was found in 200% of Anays's mitochondria and 100% of her mother's mitochondria. Brother presumably had the same variant. It was noted that since Aiea and Braxton inherited the same change as the mother and at the same levels, it was unlikely that this variant was causing Lilybeth's and Braxton's symptoms, because the mother did not exhibit similar symptoms. [Addendum 02/05/18: Mom says that she did have some of the same symptoms that her children had, but the geneticist might not have realized that mom did have some of the same symptoms. Mom did not have as many symptoms as Shelbie and Braxton had.] Multiple rounds of genetic testing including WES and mitochondrial genome sequencing, were not suggestive of mitochondrial disorder or any other genetic diseases. The family was offered the option of applying for further testing in the [facility]. The family applied for that program, but were not accepted due to the fact that the program wanted patients with different issues.     C). When Zivah saw Dr Penni Bombard, a geneticist in [place], he confirmed that she does have a mitochondrial disorder and also has Ehlers-Danlos syndrome.     6). Feeling unwell, fatigue, fevers, headaches, nasal and head congestion, lack of appetite: These problems vary from one part of  the day to another. She has at least one of them every day.     A). Fatigue: She does not feel tired when she awakens, but does begin to feel tired about 2 PM. If she naps for an hour or two, the fatigue improves for several hours, but then recurs around 6 PM. If she does not nap, she remains tired and develops a bitemporal headache about 4-5 PM. She occasionally has a problem with insomnia, but not with early awakening. Sleeping in on weekends does not improve her fatigue.      B). Fevers: When she feels bad she usually has fevers. Most of her fevers are in the 98.8-99.2 range. These "fevers" occur about 5-7 PM.     C). Nasal and head congestion: She has had a stuffed up nose for 4-5 months.  When she has nasal congestion she also sometimes has head congestion. However, she does not have head congestion without having nasal congestion.      D). Headaches: She often has nuchal headaches when her trapezius muscles are sore/stiff. Sometimes these nuchal headache extend to the vertex. She also has bitemporal headaches when she gets tired.   F. Pertinent family history:   1). Stature and puberty: Mom is 5-1. Dad is 5-5. Mom had menarche at age 21. Mom has had irregular menses prior to her first pregnancy. She was diagnosed with endometriosis after her first pregnancy. After treatment for endometriosis, presumably GnRH analogues for 6 months, her menses were normal.     2). Obesity: Maternal grandmother   3). DM: Maternal grandmother has T2DM. A maternal grand uncle had DM, possibly type 1.    4). Thyroid: 21 y.o. sister has had a few abnormal thyroid hormone levels. Paternal grandmother is hypothyroid, without having had thyroid surgery, irradiation. or been on a prolonged low iodine diet.    5). ASCVD: Mother has a heart murmur.    6). Cancers: paternal grandfather dies of lung CA. There was also leukemia in a distant paternal relative.    7). Others: Older sister has had oligomenorrhea frequently. Mother  has migraines. Younger brother has worse mitochondrial dysfunction and seizures. Maternal grandmother has osteoarthritis and fibromyalgia.  Maternal grandmother takes B12 shots for anemia. Maternal grandmother also has hypertension.  One maternal uncle had cardiomyopathy. Maternal grand uncle had his first CVA at age 21. A distant paternal second cousin and a paternal first cousin had lupus.]  G. Lifestyle:   1). Raphaela's diet: She can eat food normally and typically eats 3 small meals per day. She estimates that about 40% of her calories comes from oral intake. She does not eat much at any one time. She is not a "foodie" by nature and is a very picky eater.  She has G-tube feedings every night: 2 cans of Vital Peptide, 1.5 calories per mL, continuous feeding, from 9:30 PM to 7:30 AM via a pump. She does not usually take fluids by the G-tube during the day, but may do so during the Summer months to prevent dehydration.    2). She receives iv fluid infusions by her port-a-cath every other week: D5 1/2 NS, three 1000 ml bags over 12 hours. If she gets sick or gets dehydrated, she gets 3000 ml of D10NS iv over 12 hour. She receives substantial home health nursing support from [facility].     3). Physical activities: She does not walk much for exercise because walking causes her feet to swell. She does not go outside much during the Winter months. She was told by one doctor that she had angioedema.    4). Medications:    A). Morning medications: CoQ10, vitamin E, vitamin C. Prevacid, and allergy medications. Breakfast is usually a fruit smoothy.     B). Bedtime: LT4, Topamax, Lyrica, Seasonale,  2. Clinical course:  A. In May 2020 she had a central venous catheter access device inserted. The catheter is working well.  Crissie FiguresB. Jakyrah saw Dr. Damita LackStoudemire on 11/19/18. Her PFTs were essentially normal. The MIP/MEPs were better, but still low. Her sleep study was normal. He put her on Mestinon three times daily.  The Mestinon helped for several hours.   C. She also saw Dr. Artis FlockWolfe on 11/19/18. Dr. Artis FlockWolfe [facility]   Gerarda Gunther. Kanyah has a long history of  developing physical symptoms that she attributes to adverse reactions to medications and then stopping those medications without discussing the issues with the physicians that prescribed the medications. I wonder if some of those reactions were really due to the medications involved. She also has a history of not always following up with specialists that she has seen if what they prescribed she felt was ineffective or might have caused adverse reactions .   E. I tried to start her on fludrocortisone in January 2021, but she stopped it after about 3 weeks because it made her feel bad.   F. On 03/04/19 Dr. Artis Flock had a video visit with Liba and increased her mestinon to 120 mg, three times daily. Teleshia feels that the mestinon is helping "a little bit".   G. On 03/06/19 she saw a GI specialist at [facility]. She was treated with IBGard as needed. She did not feel that the IBGard helped very much and did not contact the specialist for follow up.   H. On 03/24/19 Dashana's G-tube and port-a-cath were replaced.    I. On 05/11/19 she was seen in ortho. A diagnosis of bilateral shoulder subluxations was made, presumably due to Ehlers-Danlos syndrome. She is in PT now.   J. On 06/23/19 she had a NCV/EMG study at [facility]. Family does not know the results.   K. She saw ENT in October 2021. 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 The ENT suggested that she might have TMJ.  3. Shalamar's last [facility] clinic visit occurred on 11/28/19. I continued her on Synthroid 88 mcg/day.   A. In the interim she has been fairly healthy, except for a "cold" and some migraine headaches. She still has a lot of back pain. She has not had any further excess vaginal bleeding. She does have menses every 3 months with her Seasonale OCPs that she takes daily. She does  not have a regular OB/GYN provider  B. She has not had as many sensations of having elevated temperatures to 100 degrees in the evenings, so she is not checking her temperatures anymore. She is still often tired, but not as often as before. She is still having alternating sensations of being hot and cold. She thinks her headaches are better. She still has orthostatic dizziness.   C Her breathing difficulties have remained about the same. Dr. Damita Lack feels that she may have muscle weakness. He has referred her back to Dr. Artis Flock.   D. She still receiving feedings orally and also via her G-tube. Most of her nutrition is coming through food that she takes in orally.    E. She still takes 88 mcg/day of Synthroid. She still has weekly 12-hour iv infusions of D5. She feels better for about a week after the infusions.  F.  She continues to have episodic pains in the TMJ area.   G. The eczema around her eyes and forehead has flared up. Peds Neuro referred her to dermatology in [place].   H. She says she is eating more. Mom says, "maybe, but she doesn't eat much at any one time."  I. She still has a lot of nausea in the middle of the night and in the mornings. The nausea often takes away her appetite. Sometimes when she eats the nausea gets worse. She still takes Prevacid daily.   4. Pertinent Review of Systems:  Constitutional: Eilah feels "good: She is "less tired". She is colder than others all of the time. When she was out in the heat  during the Summer, she sometimes sweated, but since then the only times she sweats is in her hand.  Eyes: As above. Vision is "not great". She saw a neuroophthalmologist on 06/16/19. The possibility of optic neuropathy, similar to her brother, was raised. She will have an annual follow up. There are no other significant eye complaints. Neck: The patient has not had any complaints of anterior neck soreness and difficulty swallowing recently.  Heart: Her heart rate in the past  was usually in the 60-70s, but when she was tired the heart rate can be in the 50s-60s. She has not been checking her HR for some time. The patient has few complaints of palpitations, irregular heat beats, chest pain, or chest pressure. Gastrointestinal: As above. She has frequent nausea in the middle of the night. The nausea can last for up to three hours if she does not take Zofran. She sometimes also has nausea during the day, but not as much. She has not been able to identify a particular trigger. She also occasionally has some mild reflux. She infrequently has epigastric pains. Bowel movents have been more normal,. She is not having as much alternating diarrhea and constipation. The patient has no complaints of excessive hunger. Hands: Always wet and cold.  Legs: Muscle mass and strength "are okay". She occasionally has pains of her anterior thighs and anterior shins. There are no other complaints of numbness, tingling, burning, or pain. She notes edema about 3-4 days per week.  Feet: Feet are always wet and cold. There are no obvious foot problems. She occasionally has numbing of the medial 3-4 toes. There are no other complaints of numbness, tingling, burning, or pain. She often has dorsal edema.  GYN: As above. Her Seasonale contraceptive is causing her to have menses about every three months.   PAST MEDICAL, FAMILY, AND SOCIAL HISTORY  Past Medical History:  Diagnosis Date  . Allergy to alpha-gal    elevated IgE 04/10/16  . Autonomic dysfunction   . Chiari I malformation (HCC)   . Complication of anesthesia    slow to wake up  . Dyspnea   . Eczema   . GERD (gastroesophageal reflux disease)   . Headache   . Heart murmur   . Hypoglycemia   . Hypothyroid   . IBS (irritable bowel syndrome)   . Joint pain   . Mitochondrial disease (HCC)   . Muscle pain   . Pneumonia    several times  . PONV (postoperative nausea and vomiting)     Family History  Problem Relation Age of Onset  .  Asthma Mother   . Allergic rhinitis Mother   . Heart murmur Mother   . Allergic rhinitis Sister   . Allergic rhinitis Brother   . Asthma Brother   . Seizures Brother   . Mitochondrial disorder Brother   . Allergic rhinitis Maternal Grandmother   . Asthma Maternal Grandmother   . Diabetes type II Maternal Grandmother   . Lung cancer Maternal Grandfather   . Angioedema Neg Hx   . Eczema Neg Hx   . Immunodeficiency Neg Hx   . Urticaria Neg Hx      Current Outpatient Medications:  .  albuterol (ACCUNEB) 0.63 MG/3ML nebulizer solution, Take 3ml by nebulizer 3 times per day as needed for shortness of breath, Disp: 270 mL, Rfl: 5 .  ascorbic acid (VITAMIN C) 500 MG tablet, Take 500 mg by mouth daily. , Disp: , Rfl:  .  lansoprazole (PREVACID) 30  MG capsule, TAKE 1 CAPSULE BY MOUTH DAILY, Disp: 90 capsule, Rfl: 2 .  leucovorin (WELLCOVORIN) 25 MG tablet, Take 1 tablet by mouth 2 (two) times daily., Disp: , Rfl:  .  levonorgestrel-ethinyl estradiol (SEASONALE) 0.15-0.03 MG tablet, Take 1 tablet by mouth daily., Disp: 90 tablet, Rfl: 2 .  metaxalone (SKELAXIN) 400 MG tablet, Take 1 tablet (400 mg total) by mouth daily as needed (Muslce pain)., Disp: 60 tablet, Rfl: 3 .  pregabalin (LYRICA) 25 MG capsule, Take 1 capsule (25 mg total) by mouth 2 (two) times daily., Disp: 60 capsule, Rfl: 5 .  pregabalin (LYRICA) 75 MG capsule, TAKE 2 CAPSULES BY MOUTH EACH NIGHT AT BEDTIME, Disp: 60 capsule, Rfl: 5 .  PRESCRIPTION MEDICATION, Apply 1 application topically as needed (port). Elma- cream , Disp: , Rfl:  .  pyridostigmine (MESTINON) 60 MG tablet, Take 1 tablet (60 mg total) by mouth in the morning, at noon, in the evening, and at bedtime., Disp: 120 tablet, Rfl: 5 .  rizatriptan (MAXALT) 10 MG tablet, Take 1 tablet (10 mg total) by mouth as needed for migraine (May repeat in 2 hours if needed). May repeat in 2 hours if needed, Disp: 12 tablet, Rfl: 3 .  SYNTHROID 88 MCG tablet, TAKE 1 TABLET BY MOUTH  EVERY MORNING ON AN EMPTY STOMACH, Disp: 90 tablet, Rfl: 1 .  topiramate (TOPAMAX) 25 MG tablet, TAKE 1 TABLET BY MOUTH EACH NIGHT AT BEDTIME, Disp: 30 tablet, Rfl: 3 .  albuterol (PROVENTIL HFA) 108 (90 Base) MCG/ACT inhaler, Inhale 2 puffs into the lungs every 4 (four) hours as needed for wheezing or shortness of breath. (Patient not taking: Reported on 11/17/2019), Disp: 6.7 g, Rfl: 2 .  azelastine (ASTELIN) 0.1 % nasal spray, Place 2 sprays into both nostrils 2 (two) times daily. Use in each nostril as directed (Patient not taking: Reported on 11/17/2019), Disp: 30 mL, Rfl: 5 .  clobetasol (OLUX) 0.05 % topical foam, Apply topically 2 (two) times daily. Apply to affected areas twice daily as needed taking care to avoid axillae and groin area. (Patient not taking: Reported on 11/28/2019), Disp: 45 g, Rfl: 3 .  Coenzyme Q-10 200 MG CAPS, Take 1 capsule by mouth daily.  (Patient not taking: Reported on 11/28/2019), Disp: , Rfl:  .  cromolyn (GASTROCROM) 100 MG/5ML solution, Take 5 mLs (100 mg total) by mouth 4 (four) times daily -  before meals and at bedtime. (Patient not taking: Reported on 11/17/2019), Disp: 600 mL, Rfl: 12 .  EPINEPHrine 0.3 mg/0.3 mL IJ SOAJ injection, Inject 0.3 mg into the muscle once. As needed (Patient not taking: Reported on 11/28/2019), Disp: , Rfl:  .  levOCARNitine (CARNITOR) 1 GM/10ML solution, TAKE 2 TEASPOONFUL (10ML) BY MOUTH TWICEDAILY (Patient taking differently: Place 1,000 mg into feeding tube daily. ), Disp: 118 mL, Rfl: 12 .  lidocaine (XYLOCAINE) 5 % ointment, Apply 1 application topically as needed. (Patient not taking: Reported on 11/28/2019), Disp: 35.44 g, Rfl: 3 .  Nutritional Supplements (ENSURE CLEAR) LIQD, 474 mLs by Gastrostomy Tube route daily. (Patient taking differently: 474 mLs by Gastrostomy Tube route 4 (four) times daily. During the day  By mouth or G tube), Disp: 60 Bottle, Rfl: 5 .  Nutritional Supplements (FEEDING SUPPLEMENT, VITAL 1.5 CAL,) LIQD,  Place 1,000 mLs into feeding tube continuous. bedtime (Patient not taking: Reported on 11/28/2019), Disp: , Rfl:  .  ondansetron (ZOFRAN-ODT) 4 MG disintegrating tablet, Take 1 tablet under the tongue every 6-8 hours as needed  for nausea. (Patient not taking: Reported on 11/28/2019), Disp: 30 tablet, Rfl: 5 .  promethazine (PHENERGAN) 25 MG tablet, Take 1 tablet at onset of migraine. May repeat every 6 hours PRN (Patient taking differently: Take 25 mg by mouth every 6 (six) hours as needed for nausea (migraine.). ), Disp: 30 tablet, Rfl: 0  Allergies as of 04/23/2020 - Review Complete 04/23/2020  Allergen Reaction Noted  . Other Anaphylaxis 03/29/2013  . Latex Rash 04/08/2012  . Lactated ringers  05/28/2018  . Propofol Other (See Comments) 05/17/2012  . Tape Itching and Other (See Comments) 05/17/2012     reports that she has never smoked. She has never used smokeless tobacco. She reports that she does not drink alcohol and does not use drugs. Pediatric History  Patient Parents  . Barthel,Leesa (Mother)  . Hilbert Corrigan (Father)   Other Topics Concern  . Not on file  Social History Narrative   Kasi graduated from [employer]. Will attend [employer].    She enjoys reading, playing with her cat and her dog, and hanging out with her sister.     1. School and Family: She attends [employer] with on-line classes and lives at home.  2. Activities: Sedentary 3. Primary Care Provider: Verdene Lennert, MD She is due to see a new internist soon.  4. Peds Neurology: Dr. [name], MD 5. Allergy: [facility]. Malachi Bonds[facility] [place] 6. GI: No one at present 7. Peds End[facility]in[facility]: [facility] 8. Peds Pulmonary: Dr. Damita Lack 9. Genetics: [facility] in the past 10. GYN: None  REVIEW OF SYSTEMS: There are no other significant problems involving Joury's other body systems.    Objective:  Objective  Vital Signs:  BP 118/80 (BP Location:  Right Arm, Patient Position: Sitting, Cuff Size: Normal)   Wt 110 lb 6.4 oz (50.1 kg)   BMI 18.95 kg/m    Ht Readings from Last 3 Encounters:  02/23/20  (1.626 m)  12/01/19  (1.626 m)  11/17/19  (1.626 m)   Wt Readings from Last 3 Encounters:  04/23/20 110 lb 6.4 oz (50.1 kg)  02/23/20 112 lb 9.6 oz (51.1 kg)  12/01/19 117 lb 11.2 oz (53.4 kg)   HC Readings from Last 3 Encounters:  No data found for [facility]   Body surface area is 1.5 meters squared. Facility age limit for growth percentiles is 20 years. Facility age limit for growth percentiles is 20 years.   PHYSICAL EXAM:  Constitutional: Nastasia looks somewhat tired. She is awake and alert, but fairly passive. She answers questions with 1-3 word answers, but does not volunteer any information. Her affect is rather flat. Her insight is good. She has lost 7 pounds in 5 months.   Eyes: There is no arcus or proptosis.  Mouth: The oropharynx appears normal. The tongue appears normal. There is normal oral moisture. There is no obvious gingivitis. Thre is no oral hyperpigmentation.  Neck: There are no bruits present. The thyroid gland appears mildly enlarged.  The thyroid gland is still approximately 21-22. grams in size. However, the right lobe has shrunk back to top-normal size and her left lobe is larger. The consistency of the left lobe of the gland is full, but the right lobe is normal.  She has some discomfort of her left lobe today.  Lungs: The lungs are clear. Air movement is good. Heart: The heart rhythm and rate appear normal. Heart sounds S1 and S2 are normal. I do  not appreciate any pathologic heart murmurs. Abdomen: The abdominal size is normal. Bowel sounds are normal. The G-tube site is clean. There is no obviously palpable hepatomegaly, splenomegaly, or other masses. There was no tenderness to palpation.  Arms: Muscle mass appears appropriate for age.  Hands: There is no obvious tremor. Phalangeal and  metacarpophalangeal joints appear normal. Palms are normal. Nails are moderately pallid.  Legs: Muscle mass appears appropriate for age. There is no edema.  Neurologic: Muscle strength is 4/5 in the UEs and 4-5/5 in th LEs. Muscle tone appears normal. Sensation to touch is normal in the legs.    LAB DATA:   Labs 04/20/20: TSH 1.14, free T4 1.4, free T3 2.9; CBC normal, except platelets 428 (ref 140-400);   Labs 11/17/19: TSH 1.21, free T4 1.1, free T3 2.9 (ref 3.0-4.7); CBC normal; iron 69; LH <0.2, FSH 0.7,   Labs 07/25/19: HbA1c 4.8%,  TSH 1.67, free T4 1.3, free T3 3.0; CBC normal, except RBC 5.22 (ref 3.8-5.1) and Hct 45.5% (ref 35-45%); iron 79 (ref 40-190)  Labs 03/24/19: Serum sodium 141, potassium 5.2, chloride 19, CO2 18, glucose 81  Labs 02/10/19: HbA1c 5.0%; TSH 0.73, free T4 1.3, free T3 3.0; CMP normal  Labs 11/12/18: TSH 1.12, free T4 1.38, T3 156 (ref 71-180); prolactin 11.3 (ref 4.8-23.3)  Labs 09/16/18: Androstenedione 55 (ref 51-230), DHEAS 67 (ref 51-321)  Labs 08/30/18 at 8:43 AM: ACTH 13 (ref 6-50), cortisol 26.2 (ref 4-22), aldosterone 4 (ref < 28), plasma renin activity 2.4 (ref 0.25-5.82),   Labs 08/02/18: TSH 2.12, free T4 1.3, free T3 3.0; CMP normal; aldosterone <1 (reg 3-28), plasma renin activity (PRA) 0.92 (ref 0.25-5.82); prolactin 9.9 (ref 3-30)  Labs 02/01/18 at 8 AM: TSH 4.16 (ref 0.50-4.30, but many endocrinologists consider 3.4 to be the true physiologic upper limit of normal), free T4 1.4 (ref 0.8-1.4), free T3 2.7 (ref 3.0-4.7), TPO antibody 241 (ref <9), thyroglobulin antibody 1 (ref < or = 1); CMP normal except CO2 19; cholesterol 123, triglycerides 114, HDL 37, LDL 66; Prolactin 86.7 (ref 3.3-20); IGFBP-3 7.8 (ref 3.2-7.9); IGF-1 249 (ref 108-548, but very appropriate for a young woman whose height has plateaued); iron 177 (ref 27-164); CBC normal; androstenedione 46 (ref 51-230), DHEAS 74 (ref 51-321); ACTH 14, cortisol 28.9  Labs 11/25/17: TSH 1.58; CBC  normal except 736 eosinophils (ref 15-500)  Labs 06/24/17: TSH 2.418, free T4 0.98  Labs 04/21/17: TSH 1.30  Labs 03/10/17: TSH 7.87, free T4 1.2; CBC normal; prolactin 7.1  Labs 06/24/16: TSH 1.960, free T4 1.12  Labs 04/10/16: TSH 2.28, TPO antibody 165 (ref <9), thyroglobulin antibody 2 (ref <2)  Labs 05/29/15: TSH 1.640, free T4 1.09  Labs 11/28/14: TSH 1.87, free T4 0.80  Labs 05/31/2014: TSH 1.680, free T4 1.23  Labs 04/19/14: TSH 1.19  Labs 09/30/13: TSH 2.53, free T4 1.14  Labs 02/04/13: TSH 0.88, T4 11.3  Labs 10/18/12: TSH 10.35 (ref 0.5-4.5), free T4 0.67 (ref 0.8-2.0)  IMAGING  Thyroid ultrasound 12/01/19: Small, heterogenous thyroid. 3 mm hypoechoic nodule left inferior lobe. No indication for biopsy or dedicated imaging follow up.     Assessment and Plan:  Assessment  ASSESSMENT:  1-3. Hypothyroid, acquired/goiter/thyroiditis:   A. The occurrence of autoimmune thyroid disease with positive anti-thyroid antibodies is very common.   B. At her visit in January 2020 her TSH was >4.0 and her TPO antibody level was higher. We increased her Synthroid dose to 88 mcg/day.   C. From January to August  2020 her goiter remained the same in overall size, but the lobes had shifted in size. The process of waxing and waning of thyroid gland size was c/w evolving Hashimoto's thyroiditis.   D. At her August visit her TFTs were at about the 30% of the physiologic range. I would usually have  increased the Synthroid dose to 100 mcg/day, but decided to await the results of her Hypothalamic-Pituitary-Adrenal (HPA) axis testing. Fortunately, those results were normal.   E. Since her August 2020 visit she has had intermittent flare ups of thyroiditis.   E. Her TFTs drawn on 11/12/18 were mid-euthyroid on her current Synthroid dose of 88 mcg/day. Her TFTs on 02/10/19 were at about the 75% of the physiologic range. Her TFTs in July 2021 were mid-euthyroid. Her TFTS in November were mid-euthyroid  according to her TSH and free T4, but slightly low according to her free T3. These changes were c/w a flare up of thyroiditis, which she is still having. She looked mildly hypothyroid clinically at that time.   F. Her thyroid US in November 2021 was read as normal by the radiologist. By our standards the left and right lobes were mildly enlarged.   G. .Her goiter is more asymmetric today and is moderately uncomfortable on the left side. Her TFTs in April 2022 were mid-normal. We will continue the 88 mcg dose for now, but repeat her TFTs prior to her next visit.   H. The episodic tenderness of the gland and the shifting of size in the lobes from visit to visit is c/w evolving Hashimoto's thyroiditis.  4. Dizziness/hypotension:   A. She still occasionally has elements of spinning dizziness c/w vertigo, orthostatic dizziness c/w POTS /dysautonomia and intermittent dehydration. Fortunately, these symptoms have been less frequent and less severe.   B. Chewing gum and taking meclizine can help the vertigo.  C. Remaining well hydrated can help with the ortho stasis.    D. Since both her aldosterone and renin were "lowish" in October. It was reasonable to conduct a therapeutic trial of fludrocortisone. 0.1 mg/day. Unfortunately, she felt that the medication did not help her, so stopped it.  5-7. Weakness/fatigue/cold sensation:    A. The cause(s) of these problems is/are unclear. 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   B. Her ACTH and adrenal hormone tests were normal in January 2020 and again in August and September 2020. Her aldosterone was very low in July 2020 but normal in August.   C. As noted above, she was euthyroid in July and November 2021.   D. She could have fibromyalgia, chronic fatigue, or some other "autoimmune" problem. 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   E. Giving the infusions of D5W every week has made Beckham feel better.   F. Hot  showers cause her to feel weak and dizzy. This problem is c/w orthostatic hypotension and with dysautonomia.  G. Interestingly, her fatigue is better and her evening mildly elevated temperature sensations are better.  8. Hypoglycemia:   A. The cause(s) of this problem is/are also unclear. Her hypothalamic-pituitary-hepatic axis for [facility] and her hypothalamic-pituitary-adrenal axis seemed to be normal previously. She could have celiac disease, but her tests in December 2019 were negative. She could have a problem with glycogen storage, glycogenolysis, and/or gluconeogenesis. The fact that she often does not eat much, and the fact that she appears to have a very active gastro-colic reflex may interact to impair glucose intake, glucose absorption, and glycogen  synthesis.  B. She has still occasionally had some hypoglycemic symptoms, but less frequently.  9. Abnormal menses:   A. The cause(s) of this problem is/are also unclear. Mother gives a good history for her own endometriosis that resolved after treatment with GnRH agonists.   BGerarda Gunther has had vaginal bleeding for about the past 3 weeks. She needs to see GYN.   C. Her menses are now c/w her [place] OCPs.  10. Alternating constipation and diarrhea: She has a very active gastro-colic reflex. She was seen by GI in February 2021, but has not returned to clinic. She is doing well now.  11. Hyperprolactinemia:   A. It is highly likely that her elevated prolactin In January 2020 was due to her hypothyroidism.   B. Her prolactin level in July 2020 was well within normal, paralleling her improvement in thyroid hormone status. Her prolactin level in October 2020 was also quite normal.     12. Hyperhidrosis, palms and soles: She may have dysautonomia.  13. Dehydration: She is consciously drinking 32 ounces of fluid per day. This problem could be due to an inadequate renin-angiotensin-aldosterone system, or to chronic diarrhea, or to other causes.   14.  Bradycardia:   A. She is not on a beta-blocker or combined alpha-beta blocker. The fact that mom also has bradycardia suggests a dysautonomia. Hypothyroidism may also have been a causal factor in the past.  B. Recently, most of her HRs have been in the 60s-70s. 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 The aldosterone level and the renin level in August were normal. Her renal function and her adrenal functions were normal in August and September. As noted above, we tried to  conduct a therapeutic trial of fludrocortisone.  16. Breathing difficulty:  Dr. Damita Lack was following this problem, but referred her back to Dr. Artis Flock.   17. Pallid nail beds. Her CBC in July and November 2021 and in April 2022 were essentially normal. Her iron levels in July and November were normal. She still has pallor today. 18. Nausea/heartburn: She may have developed tachyphylaxis to lansoprazole. Rabeprazole might help.  19. Weight loss, unintentional: Nausea sometimes adversely affects her appetite. If she eats too much, she develops stomach pains.  These symptoms are c/w hyperacidity.   PLAN:  1. Diagnostic: I reviewed lab results. I ordered TFTs, CBC, iron, and prolactin to be done 1-2 weeks prior to her next visit.   2. Therapeutic: Continue Synthroid 88 mcg/day for now.  D/c Prevacid. Start rabeprazole, 20 mg, twice daily. 3. Patient education: We discussed all of the above at great length. We again discussed the [place] method to maintain BP when she arises from a supine or sitting position.   4. Follow-up: 4 months.     David Stall, MD, CDE Pediatric and Adult Endocrinology

## 2020-04-23 ENCOUNTER — Ambulatory Visit (INDEPENDENT_AMBULATORY_CARE_PROVIDER_SITE_OTHER): Payer: Commercial Managed Care - PPO | Admitting: "Endocrinology

## 2020-04-23 ENCOUNTER — Encounter (INDEPENDENT_AMBULATORY_CARE_PROVIDER_SITE_OTHER): Payer: Self-pay | Admitting: "Endocrinology

## 2020-04-23 ENCOUNTER — Other Ambulatory Visit: Payer: Self-pay

## 2020-04-23 VITALS — BP 118/80 | Wt 110.4 lb

## 2020-04-23 DIAGNOSIS — E063 Autoimmune thyroiditis: Secondary | ICD-10-CM | POA: Diagnosis not present

## 2020-04-23 DIAGNOSIS — R1013 Epigastric pain: Secondary | ICD-10-CM

## 2020-04-23 DIAGNOSIS — R12 Heartburn: Secondary | ICD-10-CM | POA: Diagnosis not present

## 2020-04-23 DIAGNOSIS — R110 Nausea: Secondary | ICD-10-CM

## 2020-04-23 DIAGNOSIS — R5383 Other fatigue: Secondary | ICD-10-CM

## 2020-04-23 DIAGNOSIS — R634 Abnormal weight loss: Secondary | ICD-10-CM

## 2020-04-23 DIAGNOSIS — R231 Pallor: Secondary | ICD-10-CM

## 2020-04-23 DIAGNOSIS — E049 Nontoxic goiter, unspecified: Secondary | ICD-10-CM

## 2020-04-23 DIAGNOSIS — I951 Orthostatic hypotension: Secondary | ICD-10-CM

## 2020-04-23 DIAGNOSIS — N926 Irregular menstruation, unspecified: Secondary | ICD-10-CM

## 2020-04-23 DIAGNOSIS — E221 Hyperprolactinemia: Secondary | ICD-10-CM

## 2020-04-23 MED ORDER — RABEPRAZOLE SODIUM 20 MG PO TBEC: DELAYED_RELEASE_TABLET | ORAL | 6 refills | Status: DC

## 2020-04-23 NOTE — Patient Instructions (Addendum)
Follow up in 4 months. Please repeat lab tests 1-2 weeks prior. Please call in two weeks if rabeprazole is not helping.

## 2020-04-24 ENCOUNTER — Encounter (INDEPENDENT_AMBULATORY_CARE_PROVIDER_SITE_OTHER): Payer: Self-pay

## 2020-04-24 ENCOUNTER — Encounter (INDEPENDENT_AMBULATORY_CARE_PROVIDER_SITE_OTHER): Payer: Self-pay | Admitting: Dietician

## 2020-04-24 DIAGNOSIS — G43009 Migraine without aura, not intractable, without status migrainosus: Secondary | ICD-10-CM

## 2020-04-24 MED ORDER — TROKENDI XR 50 MG PO CP24: ORAL_CAPSULE | ORAL | 5 refills | Status: DC

## 2020-04-24 MED ORDER — TOPIRAMATE 25 MG PO TABS: ORAL_TABLET | ORAL | 3 refills | Status: DC

## 2020-05-02 ENCOUNTER — Other Ambulatory Visit (INDEPENDENT_AMBULATORY_CARE_PROVIDER_SITE_OTHER): Payer: Self-pay | Admitting: Pediatrics

## 2020-05-04 ENCOUNTER — Encounter (INDEPENDENT_AMBULATORY_CARE_PROVIDER_SITE_OTHER): Payer: Self-pay | Admitting: Family

## 2020-05-04 NOTE — Progress Notes (Signed)
I received a request for PA for [facility] 800mg  for Laura Parrish. The previous PA was for [facility] 400mg  but it is no longer available. I did the PA for the 800mg  dose and updated her medication list. TG

## 2020-05-14 ENCOUNTER — Telehealth (INDEPENDENT_AMBULATORY_CARE_PROVIDER_SITE_OTHER): Payer: Self-pay | Admitting: Pediatrics

## 2020-05-14 NOTE — Telephone Encounter (Signed)
  Who's calling (name and relationship to patient) :Cipriano Mile ( mom)  Best contact number:[phone]  Provider they see: Dr. Artis Flock  Reason for call: Patient had sent me a Myhart message about scheduling her Follow up visit but she is not sure if she needs to schedule or wait for the referral to EMG at [facility] she said she still hasn't received this  and didn't want to schedule until she gets the referral. If someone could call her back and verify what the net step is with the patient after the referrals are sent please      PRESCRIPTION REFILL ONLY  Name of prescription:  Pharmacy:

## 2020-05-16 NOTE — Telephone Encounter (Signed)
I am fine with patient holding on appointment with me until she gets the EMG.  Sarah, please follow-up on referral. Patient has cancelled appointment with [facility], that needs to be taken up with [facility].   [name] MD MPH

## 2020-05-17 ENCOUNTER — Ambulatory Visit (INDEPENDENT_AMBULATORY_CARE_PROVIDER_SITE_OTHER): Payer: Commercial Managed Care - PPO | Admitting: Dietician

## 2020-05-17 ENCOUNTER — Encounter (INDEPENDENT_AMBULATORY_CARE_PROVIDER_SITE_OTHER): Payer: Self-pay

## 2020-05-21 NOTE — Telephone Encounter (Signed)
Confirmed correct fax for Diagnostic Neurology is [phone]  Re-faxed referral with notes and faxed a letter explaining information as well.  Called to confirm receipt spoke to Jasmine December she reports fax number is correct but she cannot see the fax until it is logged into their system.

## 2020-05-22 NOTE — Telephone Encounter (Signed)
I confirmed with Maralyn Sago the last EMG order was actually sent to [facility].  Sarah to cancel new order at [facility] and resend order to [facility].    [name] MD MPH

## 2020-05-22 NOTE — Telephone Encounter (Signed)
Call to [facility] Neurology- transferred to [facility] EMG lab- [phone] requested fax number to fax referral [phone]- referral and note faxed through Epic

## 2020-05-31 ENCOUNTER — Encounter: Payer: Self-pay | Admitting: Internal Medicine

## 2020-05-31 ENCOUNTER — Ambulatory Visit (INDEPENDENT_AMBULATORY_CARE_PROVIDER_SITE_OTHER): Payer: Commercial Managed Care - PPO | Admitting: Internal Medicine

## 2020-05-31 ENCOUNTER — Other Ambulatory Visit: Payer: Self-pay

## 2020-05-31 VITALS — BP 115/68 | HR 76 | Temp 98.2°F | Ht 63.0 in | Wt 118.8 lb

## 2020-05-31 DIAGNOSIS — F5101 Primary insomnia: Secondary | ICD-10-CM

## 2020-05-31 DIAGNOSIS — I7300 Raynaud's syndrome without gangrene: Secondary | ICD-10-CM | POA: Diagnosis not present

## 2020-05-31 DIAGNOSIS — G4700 Insomnia, unspecified: Secondary | ICD-10-CM | POA: Insufficient documentation

## 2020-05-31 MED ORDER — TRAZODONE HCL 50 MG PO TABS: ORAL_TABLET | ORAL | 1 refills | Status: DC

## 2020-05-31 NOTE — Assessment & Plan Note (Addendum)
Ms. Laura Parrish states that she continues to have episodes of Raynaud's where her fingers will turn white.  Episodes last between 20 minutes to 30 minutes.  Pain associated with these episodes is manageable.  We discussed that management for Raynaud's generally includes calcium channel blockers that may decrease her blood pressure; this produced concerned that she already experiences orthostatic hypotension that is quite symptomatic.  Assessment/plan: At this time, given patient's history of orthostatic hypotension, will hold off on pharmacological treatment.  Per chart review, I cannot find work-up for lupus or rheumatoid arthritis that is commonly associated with Raynaud's.  Due to this we will start work-up at this time.  - Counseled to contact the clinic if Raynaud's episodes become longer than 30 minutes or if she develops any skin changes consistent with necrosis - Obtained ANA, double-stranded DNA, rheumatoid factor, anti-CCP  ADDENDUM:  ANA, double-stranded DNA, rheumatoid factor and anti-CCP negative.  Discussed this with Laura Parrish's mom who states that geneticist in [place] did have a high suspicion for there to be an autoimmune etiology behind patient's pain; pain is located in the back, feet and hands.  We discussed that there is a chance that anybody testing can be negative and there still be an autoimmune component ongoing.  Given patient's complicated history, rheumatology evaluation would be reasonable.  -Referral to rheumatology placed

## 2020-05-31 NOTE — Progress Notes (Signed)
   CC: Insomnia  HPI:  Ms.Liese M Loomer is a 20 y.o. with a PMHx as listed below who presents to the clinic for insomnia.   Please see the Encounters tab for problem-based Assessment & Plan regarding status of patient's acute and chronic conditions.  Past Medical History:  Diagnosis Date  . Allergy to alpha-gal    elevated IgE 04/10/16  . Autonomic dysfunction   . Chiari I malformation (HCC)   . Complication of anesthesia    slow to wake up  . Dyspnea   . Eczema   . GERD (gastroesophageal reflux disease)   . Headache   . Heart murmur   . Hypoglycemia   . Hypothyroid   . IBS (irritable bowel syndrome)   . Joint pain   . Mitochondrial disease (HCC)   . Muscle pain   . Pneumonia    several times  . PONV (postoperative nausea and vomiting)    Review of Systems: Review of Systems  Constitutional: Negative for chills and fever.  HENT: Negative for congestion and sore throat.   Neurological: Positive for dizziness (with standing). Negative for focal weakness.  Psychiatric/Behavioral: Negative for depression. The patient has insomnia. The patient is not nervous/anxious.    Physical Exam:  Vitals:   05/31/20 0857  BP: 115/68  Pulse: 76  Temp: 98.2 F (36.8 C)  TempSrc: Oral  SpO2: 100%  Weight: 118 lb 12.8 oz (53.9 kg)  Height: 5\' 3"  (1.6 m)   Physical Exam Vitals and nursing note reviewed.  Constitutional:      General: She is not in acute distress.    Appearance: She is normal weight. She is not toxic-appearing.  Cardiovascular:     Rate and Rhythm: Normal rate and regular rhythm.     Heart sounds: No murmur heard. No gallop.   Pulmonary:     Comments: Difficulty with inspiratory effort. Decreased breathe sounds throughout. No wheezing, rales or rhonchi auscultated.  Musculoskeletal:     Right lower leg: Edema (mild non pitting ) present.     Left lower leg: Edema (mild non pitting) present.  Skin:    General: Skin is warm and dry.  Neurological:      General: No focal deficit present.     Mental Status: She is alert and oriented to person, place, and time. Mental status is at baseline.     Gait: Gait normal.  Psychiatric:        Mood and Affect: Mood normal.        Behavior: Behavior normal.        Thought Content: Thought content normal.        Judgment: Judgment normal.    Assessment & Plan:   See Encounters Tab for problem based charting.  Patient discussed with Dr. 

## 2020-05-31 NOTE — Assessment & Plan Note (Signed)
Ms. Fredia Sorrow states that she continues to have insomnia for at least the past 4 months.  She states she has a past medical history of this and tends to be intermittent, triggered by any small changes in her routine.  She followed up with Dr. Sheppard Penton regarding this who recommended working on sleep hygiene.  She has been working on this without any relief.  She states that she reads before bedtime, and does not play on her phone or watch television.  She lays in bed for about an hour before she is able to fall asleep and once asleep, only sleeps for about 4 hours at maximum.  She will occasionally take naps, always before 1 PM lasting between 30 minutes to 1 hour.  She notes trying over-the-counter medications, including melatonin without relief.  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  Given patient has attempted conservative management with sleep hygiene unsuccessfully, will start medication therapy with trazodone.  We will started a low-dose and titrate as needed.  -Trazodone 25 mg, may increase to 50 mg if no effect to be taken 30 minutes before bedtime - Follow-up in the next 1 to 2 months if no improvement

## 2020-05-31 NOTE — Patient Instructions (Addendum)
It was nice seeing you today! Thank you for choosing [facility] for your Primary Care.    Today we talked about:   1. Insomnia: We are starting a new medication called Trazodone. Start with half a tablet, 30 minutes before you want to fall asleep. If this does not help, you can take a full tablet. After you take the medication, turn off all lights and try laying down.  a. If this does not work, schedule a follow up visit in 1-2 months.   2. Raynaud's: Medications we use to treat this all can affect your blood pressure. We could try a cream if needed but let's work on non-medication treatments first.  a. I am going to check some blood work to rule out other autoimmune disorders.

## 2020-06-02 LAB — ANTI-DNA ANTIBODY, DOUBLE-STRANDED: dsDNA Ab: 1 IU/mL (ref 0–9)

## 2020-06-02 LAB — RHEUMATOID FACTOR: Rheumatoid fact SerPl-aCnc: <10 IU/mL (ref <14.0)

## 2020-06-02 LAB — CYCLIC CITRUL PEPTIDE ANTIBODY, IGG/IGA: Cyclic Citrullin Peptide Ab: 1 units (ref 0–19)

## 2020-06-02 LAB — ANTINUCLEAR ANTIBODIES, IFA: ANA Titer 1: NEGATIVE

## 2020-06-04 NOTE — Progress Notes (Signed)
Internal Medicine Clinic Attending  Case discussed with Dr. Basaraba  At the time of the visit.  We reviewed the resident's history and exam and pertinent patient test results.  I agree with the assessment, diagnosis, and plan of care documented in the resident's note.  

## 2020-06-06 NOTE — Addendum Note (Signed)
Addended by: Verdene Lennert on: 06/06/2020 10:44 AM   Modules accepted: Orders

## 2020-06-18 ENCOUNTER — Telehealth (INDEPENDENT_AMBULATORY_CARE_PROVIDER_SITE_OTHER): Payer: Self-pay

## 2020-06-18 NOTE — Telephone Encounter (Signed)
[name] FNP received message from mom that she has not received a call from [facility] for her EMG. RN advised referral has been sent last sent on 5/10 and called to follow up. Person reported they could see it in the system and would be calling family soon.  Call back to EMG lab today- left message to call RN back and confirm they have the referral

## 2020-06-19 ENCOUNTER — Encounter (INDEPENDENT_AMBULATORY_CARE_PROVIDER_SITE_OTHER): Payer: Self-pay

## 2020-06-19 DIAGNOSIS — E8840 Mitochondrial metabolism disorder, unspecified: Secondary | ICD-10-CM

## 2020-06-19 DIAGNOSIS — Z931 Gastrostomy status: Secondary | ICD-10-CM

## 2020-06-19 DIAGNOSIS — R638 Other symptoms and signs concerning food and fluid intake: Secondary | ICD-10-CM

## 2020-06-19 NOTE — Telephone Encounter (Signed)
Left another message to confirm receipt of order for EMG  Called again reached a person and placed on hold 30 min call with Lowella Bandy- she reports yes they received the referrals but they are behind and it will take a few days to get it in the system. They will call and schedule as soon as they get to hers.   message sent to [facility] in Mychart to advise

## 2020-06-21 NOTE — Progress Notes (Signed)
MEDICAL GENETICS NEW PATIENT EVALUATION  Patient name: Laura Parrish DOB: [birth year] Age: 20 y.o. MRN: [id number]  Referring Provider/Specialty: Laura Coaster, MD / Pediatric Neurology Date of Evaluation: 06/22/2020 Chief Complaint/Reason for Referral: Complex medical history including chronic fatigue and weakness, extremity edema; review genetic testing  HPI: Laura Parrish is a 20 y.o. female who presents today for an initial genetics evaluation to establish local care. She has had extensive genetic testing. She is accompanied by her mother and brother, who is similarly affected, at today's visit.  Laura Parrish has an extensive medical history for which a specific cause has not been identified. 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 These were repaired by orthopedics at a young age. She showed delayed motor milestones early on and was hypotonic. She began physical and occupational therapy through early intervention services at 2 mo. She rolled at 6 mo, crawled at 9 mo, and walked at 18 mo. She cooed at 5 mo, babbled at 7 mo, and first word around 8 mo. She has not needed speech therapy.  As an infant Laura Parrish was a slow nurser. There were times she had poor appetite and would sleep excessively. She was admitted for fluids twice within the first 6 months of life for dehydration. She was identified to have a functional heart murmur which is still present and she continues to follow with cardiology.  Significant concerns began around 20 yo. Laura Parrish began experiencing unexplained headaches and chronic fatigue- she could not even get through a half day at preschool. She was not gaining weight well. She frequently fell and experienced leg pain. If she had a particularly long day she could not use her legs the next day. She has had to occasionally use a power chair or walker to get around. Various studies were performed to evaluate a cause of Laura Parrish's symptoms. 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 She underwent corrective surgery, and a year later the tether cord was released. After the chiari surgery, Laura Parrish could not return to school full time because of low energy. Mother feels her symptoms got worse over time. After procedures in general during which anesthesia is used, or after illnesses, it takes a long time for Laura Parrish to recover.  Various measures have been put in place to aid Laura Parrish. A g-tube was placed in 2010. She does not use it as much lately but tends to more during the summer time when she is lower energy. A port was placed in 2018. She receives weekly infusions to aid with dehydration, and receives infusions of D-10 when sick. More recently Laura Parrish has better learned her limits and needs, and is able to plan her day in a way that better preserves her energy. She takes supplements similar to a mitochondrial "cocktail."  Laura Parrish has a collection of other symptoms. She did not sweat until this year and in the past did not really make tears. She has recently begun experiencing significant swelling in her hands and feet accompanied by pain and tingling sensation. She has hypothyroidism. She has long and heavy periods that made her anemic. She is now on birth control pills to help make them less frequent. She has tinnitus. Laura Parrish has some symptoms suggestive of Ehlers Danlos syndrome or other connective tissue disorder, including hypermobility, issues with wound healing, keloid scars, and joints that frequently pop out of place. She is allergic to many things and lately has begun  breaking out in a rash. There is consideration of [facility] and she has a rheumatology appointment at the end of July. She has a lingering alpha gal meat allergy. Recently there has been a question of whether Laura Parrish may have myasthenia gravis. Initially studies have been normal but more are planned. In the meantime she has begun pyridostigmine and has seen  improvement of symptoms.   Prior genetic testing has been performed. Laura Parrish was seen by genetics at [facility]. An extensive workup has been performed and has been nondiagnostic. EMG, NCS, and muscle biopsy were unremarkable. There is another EMG planned at [facility] in the near future. Plasma amino acids, urine organic acids, carnitine, and acyl-carnitine profile unremarkable. She does not demonstrate findings of lactic acidemia. Per [employer] note, SNP microarray, PHOX2B testing, and testing of some Ehlers Danlos syndrome genes (COL3A1, COL6A, COL5A1, COL5A2) was done through [facility] and were normal, though a copy pf these results was not available for review. Whole exome sequencing with mitochondrial analysis as a quad with parents and brother (who has similar symptoms) was performed. 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 Per [employer] note, mitochondrial testing through [facility] had previously been performed that also identified the MT-ND6 variant but it was predicted to be benign.  There has been question of whether Laura Parrish and her brother have a mitochondrial disorder. Testing and evaluations have thus far been unrevealing and both [facility] and [facility] genetics felt that the siblings were less likely to have a mitochondrial disorder but rather a collagen disorder or other neuromuscular disorder.  Pregnancy/Birth History: Laura Parrish was born to a then 21 year old G2P1 -> P2 mother. The pregnancy was conceived naturally and was complicated by twin pregnancy with twin demise at 59 months gestation but placenta kept thriving. Additionally, mother experienced preterm labor and was on bed rest for 14 weeks. There was exposure to several medications for chronic migraines and nausea. Labs were normal. Ultrasounds were abnormal for retained placenta after twin demise. Amniotic fluid levels were normal. Fetal activity was normal.  Genetic testing performed during the pregnancy included amniocentesis because early screening showed "increased risk". Unclear what testing was done on the amniotic fluid but it was reportedly normal.  Laura Parrish was born at Gestational Age: [redacted]w[redacted]d gestation at [facility] via vaginal delivery. There were no complications. Birth weight 5 lb 7 oz (2.466 kg) (75%), birth length 21 in/53.3 cm (>90%), head circumference unknown. Right clubfoot, left toes curled in. She was in the NICU for 1 day for difficulty maintaining temperature. She was discharged home 1 days after birth. She passed the newborn screen, hearing test and congenital heart screen.  Past Medical History: Past Medical History:  Diagnosis Date   Allergy to alpha-gal    elevated IgE 04/10/16   Autonomic dysfunction    Chiari I malformation (HCC)    Complication of anesthesia    slow to wake up   Dyspnea    Eczema    GERD (gastroesophageal reflux disease)    Headache    Heart murmur    Hypoglycemia    Hypothyroid    IBS (irritable bowel syndrome)    Joint pain    Mitochondrial disease (HCC)    Muscle pain    Pneumonia    several times   PONV (postoperative nausea and vomiting)    Patient Active Problem List   Diagnosis Date Noted  Insomnia 05/31/2020   Dysautonomia (HCC) 12/02/2019   Myasthenia gravis (HCC) 12/02/2019   Healthcare maintenance 12/02/2019   Raynaud's phenomenon 12/01/2019   Menorrhagia 12/01/2019   Referred ear pain, bilateral 11/01/2019   Feeding by G-tube (HCC) 03/07/2019   Thyroiditis, autoimmune 02/06/2018   Goiter 02/06/2018   Weakness 02/06/2018   Hyperprolactinemia (HCC) 02/06/2018   Hyperhidrosis of palms and soles 02/06/2018   Dehydration 02/06/2018   At risk for dehydration 10/12/2017   Breakthrough bleeding on birth control pills 06/25/2017   Inadequate fluid intake 06/25/2017   Ehlers-Danlos syndrome 12/29/2016   Snoring 09/28/2016   Port-A-Cath in place 09/22/2016   Iron  deficiency anemia 07/23/2016   Health care home, active care coordination 07/23/2016   Complex care coordination 07/23/2016   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   Chronic pain 05/22/2016   Clubfoot, 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   Paresthesia 05/22/2016   Premature birth 05/22/2016   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   Allergy to insect bites and stings 04/10/2016   Atopic dermatitis 04/10/2016   Contact dermatitis due to chemicals 04/10/2016   Chronic rhinitis 04/10/2016   Allergic urticaria 04/10/2016   Chronic fatigue 02/15/2015   Lactose intolerance 01/27/2013   Migraine without aura and without status migrainosus, not intractable 10/01/2012   Mitochondrial disease (HCC) 03/15/2012   Irregular menstrual cycle 09/29/2011   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   [place] 05/29/2000    Past Surgical History:  Past Surgical History:  Procedure Laterality Date   ADENOIDECTOMY     2004or 2005   chiari decompression  2007   GALLBLADDER SURGERY  2015   GASTROSTOMY TUBE PLACEMENT     x 2   MUSCLE BIOPSY  2010   Port Replacement  05/2018   x 2 orignal 08/2016 then replacement 05/2018   PORT-A-CATH REMOVAL N/A 03/24/2019   Procedure: PORT-A-CATH REMOVAL;  Surgeon: Rodman Pickle, MD;  Location: [facility] OR;  Service: General;  Laterality: N/A;   PORTACATH PLACEMENT Right 03/24/2019   Procedure: PORT-A-CATH INSERTION under ultrasound and fluoroscopic guidance;  Surgeon: Rodman Pickle, MD;  Location: [facility] OR;  Service: General;  Laterality: Right;   REMOVAL OF GASTROSTOMY TUBE N/A 03/24/2019   Procedure: Exchange of Gastrostomy Tube;  Surgeon: Rodman Pickle, MD;  Location: [facility] OR;  Service: General;  Laterality: N/A;   tethered cord  07/2006   TONSILLECTOMY     [id number]    Developmental History: Milestones -- mild motor delays in early childhood.   Therapies -- physical and occupational in the past.  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 Attends [employer].    She enjoys reading, playing with her cat and her dog, and hanging out with her sister.     Medications: Current Outpatient Medications on File Prior to Visit  Medication Sig Dispense Refill   albuterol (ACCUNEB) 0.63 MG/3ML nebulizer solution Take 79ml by nebulizer 3 times per day as needed for shortness of breath 270 mL 5   ascorbic acid (VITAMIN C) 500 MG tablet Take 500 mg by mouth daily.      Coenzyme Q-10 200 MG CAPS Take 1 capsule by mouth daily.     leucovorin (WELLCOVORIN) 25 MG tablet Take 1 tablet by mouth 2 (two) times daily.     levOCARNitine (CARNITOR) 1  GM/10ML solution TAKE 2 TEASPOONFUL (10ML) BY MOUTH TWICEDAILY (Patient taking differently: Place 1,000 mg into feeding tube daily.) 118 mL 12   levonorgestrel-ethinyl estradiol (SEASONALE) 0.15-0.03 MG tablet Take 1 tablet by mouth daily. 90 tablet 2   lidocaine (XYLOCAINE) 5 % ointment Apply 1 application topically as needed. 35.44 g 3   metaxalone (SKELAXIN) 800 MG tablet TAKE 1/2 TABLET (400MG  TOTAL DOSE) BY MOUTH EVERY DAY 15 tablet 3   Nutritional Supplements (ENSURE CLEAR) LIQD 474 mLs by Gastrostomy Tube route daily. (Patient taking differently: 474 mLs by Gastrostomy Tube route 4 (four) times daily. During the day  By mouth or G tube) 60 Bottle 5   Nutritional Supplements (FEEDING SUPPLEMENT, VITAL 1.5 CAL,) LIQD Place 1,000 mLs into feeding tube continuous. bedtime     ondansetron (ZOFRAN-ODT) 4 MG disintegrating tablet Take 1 tablet under the tongue every 6-8 hours as needed for nausea. 30 tablet 5   pregabalin (LYRICA) 75 MG capsule TAKE 2 CAPSULES BY MOUTH EACH NIGHT AT BEDTIME 60 capsule 5   PRESCRIPTION MEDICATION Apply 1 application topically as needed  (port). [place]- cream      promethazine (PHENERGAN) 25 MG tablet Take 1 tablet at onset of migraine. May repeat every 6 hours PRN (Patient taking differently: Take 25 mg by mouth every 6 (six) hours as needed for nausea (migraine.).) 30 tablet 0   pyridostigmine (MESTINON) 60 MG tablet Take 1 tablet (60 mg total) by mouth in the morning, at noon, in the evening, and at bedtime. 120 tablet 5   RABEprazole (ACIPHEX) 20 MG tablet TAKE ONE TABLET TWICE DAILY. 60 tablet 6   rizatriptan (MAXALT) 10 MG tablet Take 1 tablet (10 mg total) by mouth as needed for migraine (May repeat in 2 hours if needed). May repeat in 2 hours if needed 12 tablet 3   SYNTHROID 88 MCG tablet TAKE 1 TABLET BY MOUTH EVERY MORNING ON AN EMPTY STOMACH 90 tablet 1   topiramate (TOPAMAX) 25 MG tablet TAKE 1 TABLET BY MOUTH EACH NIGHT AT BEDTIME. THIS IN ADDITION TO THE TROKENDI XR 50MG  EACH DAY 30 tablet 3   traZODone (DESYREL) 50 MG tablet Take half a tablet 30 minutes before bedtime. If needed, may increase to 1 tablet before bedtime. 30 tablet 1   TROKENDI XR 50 MG CP24 Take 1 capsule per day. This is in addition to the Topiramate 25mg  tablet at bedtime 30 capsule 5   albuterol (PROVENTIL HFA) 108 (90 Base) MCG/ACT inhaler Inhale 2 puffs into the lungs every 4 (four) hours as needed for wheezing or shortness of breath. (Patient not taking: Reported on 11/17/2019) 6.7 g 2   azelastine (ASTELIN) 0.1 % nasal spray Place 2 sprays into both nostrils 2 (two) times daily. Use in each nostril as directed (Patient not taking: No sig reported) 30 mL 5   clobetasol (OLUX) 0.05 % topical foam Apply topically 2 (two) times daily. Apply to affected areas twice daily as needed taking care to avoid axillae and groin area. (Patient not taking: No sig reported) 45 g 3   cromolyn (GASTROCROM) 100 MG/5ML solution Take 5 mLs (100 mg total) by mouth 4 (four) times daily -  before meals and at bedtime. (Patient not taking: No sig reported) 600 mL 12    EPINEPHrine 0.3 mg/0.3 mL IJ SOAJ injection Inject 0.3 mg into the muscle once. As needed (Patient not taking: No sig reported)     lansoprazole (PREVACID) 30 MG capsule TAKE 1 CAPSULE BY MOUTH DAILY (  Patient not taking: Reported on 06/22/2020) 90 capsule 2   pregabalin (LYRICA) 25 MG capsule Take 1 capsule (25 mg total) by mouth 2 (two) times daily. (Patient not taking: Reported on 06/22/2020) 60 capsule 5   No current facility-administered medications on file prior to visit.    Allergies:  Allergies  Allergen Reactions   Other Anaphylaxis    Alpha Gal allergy \ Red meat\ any hoof animal   Latex Rash   Lactated Ringers     Contraindicated w/ mitochondrial syndrome   Propofol Other (See Comments)    Due to possible mitochondrial disorder Due to possible mitochondrial disorder Due to possible mitochondrial disorder   Tape Itching and Other (See Comments)    Immunizations: up to date  Review of Systems: General: Chronic fatigue. Dehydration requiring IVF and g-tube fluids. Eyes/vision: eyes occasionally twitch. Eyes droop sometimes. Good vision but sometimes get tired. Does not think she makes tears when crying. Ears/hearing: sees ENT every two years. 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 Tinnitus.  Dental: history of an impacted tooth.  Respiratory: no concerns. Cardiovascular: functional heart murmur. Follows with cardiology. Gastrointestinal: no concerns now; history of poor appetite as a baby. Has G-tube but rarely utilizes now. Genitourinary: no concerns. Endocrine: Long heavy periods- anemia. On OCPs. Hypothyroidism. Hematologic: no concerns. Immunologic: Difficult recovery following illness and anesthesia. ?myasthenia gravis. Alpha gal allergy. Neurological: h/o chiari malformation, syringomyelia, tethered cord s/p repair. Migraines. Psychiatric: no concerns. Musculoskeletal: Leg pain and weakness. Tingling in hands/feet. Edema of the hands, feet, legs.  Clammy hands. Hypermobility. Joints sublux/dislocate. Congenital right clubbed foot and toes that curled in on the left s/p repair. Skin, Hair, Nails: rashes- scaly. Different locations. Sometimes when stressed, other times no trigger. Anhydrosis in the past, more recently has sweat a little.  Family History: See pedigree below obtained during today's visit:   Notable family history: Laura Parrish is one of three children to her parents, and there is a miscarriage in the sibship. The older sister is 68 yo, has thyroid concerns, is missing three permanent teeth, and has two large birthmarks that are being monitored and measured carefully for any changes. Laura Parrish's younger brother is 55 yo and has symptoms similar to Laura Parrish- headaches, fatigue, muscle weakness requiring occasional aid with a walker or wheelchair, g-tube, port infusions, hypermobility, and a chiari malformation that was repaired. He also has autism and optic neuropathy. He underwent whole genome sequencing through [facility] which showed the same homoplasmic MT-ND6 variant identified in [place] and their mother, as well as two heterozygous variants of uncertain signficance in the SZT2 gene that are likely not of clinical significance.  Laura Parrish's mother is 30 yo and 5'1". She has bilateral sensorineural hearing loss, a heart murmur, asthma, glasses, hypermobility, and is missing four permanent teeth. The MT-ND6 variant was identified in her at homoplasmic levels. The mother has two brothers who are twins and both have a learning disability/possible autism. They are able to function and live independently and one has a son who is healthy. The maternal grandmother has diabetes, fibromyalgia, hypermobility, and has had several joint surgeries. The maternal grandfather, who died of lung cancer at 65, had a heart condition that was present from birth.  Laura Parrish is 20 yo, 5'6", and healthy. His family history is generally unremarkable.  Mother's  ethnicity: White Parrish's ethnicity: White Consanguinity: Denies  Physical Examination: Weight: 52.2 kg (28%) Height: 5'3.9" (45%); midparental 10% Head circumference: 53 cm (10-25%)  Ht 5' 3.98" (1.625 m)  Wt 115 lb (52.2 kg)   HC 53 cm (20.87")   BMI 19.75 kg/m   General: Alert, appropriately interactive and contributes to conversation as expected, thin body habitus Head: Normocephalic Eyes: Normoset, Normal lids, lashes, brows Ears: Normoset and normally formed, no pits, tags or creases Neck: Normal appearance; well-healed scar Chest: Port scar on chest Heart: Warm and well perfused Lungs: No increased work of breathing Abdomen: Soft, non-distended, no masses, no hepatosplenomegaly, no hernias, g-tube in place Skin: Dry skin; normal scar tissue on back of neck; skin has normal texture and turgor (not hyperextensible or elastic), no stretch marks Hair: Normal anterior and posterior hairline, normal texture Neurologic: Normal gross motor by observation, no abnormal movements Psych: Age-appropriate interactions Back/spine: Mild scoliosis Extremities: Symmetric and proportionate, +edema of bilateral lower extremities from the calf region down Hands/Feet: +Edema of the hands and feet bilaterally; Perspiration on palms of hands; 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 There is another EMG planned at [facility] in the near future.  Plasma amino acids, urine organic acids, carnitine, and acyl-carnitine profile unremarkable. She does not demonstrate findings of lactic acidemia. 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  Assessment: DYANI BABEL is a 20 y.o. female with a complex medical history beginning at a young age without a unifying genetic etiology despite extensive work-up. This is our first meeting today to establish local genetics care and I believe her most active issues are as follows: chronic fatigue, muscle weakness and dehydration that improve with fluid supplementation (which is the reason for port and g-tube placement), edema of the hands, feet, ankles, calves, numbness/tingling of the hands and feet, tinnitus, recurrent rashes of unclear etiology. She has a history of anhydrosis but more recently feels like she sweats somewhat now. 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 She had hypotonia as an infant but was able to meet motor milestones appropriately with therapies. Speech development normal. She is academically doing well. Growth parameters show symmetric growth although height is above predicted mid-parental somewhat. 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 Family history is notable for her younger brother with somewhat overlapping symptoms.  Previous genetic testing was reviewed with the family. The family is aware that we have over 20,000 genes, each with an important role in the body. All of the genes are packaged into structures called chromosomes. We have two copies of every chromosome- one that is inherited from the mother and one that is inherited from the Parrish- and thus two copies of every gene. Most of the genes are located in  the nucleus of the cell and are inherited from both parents. There are some genes located in the mitochondria of the cell that are inherited only from the mother.   Changes (variants) in the chromosomes and genes  can result in symptoms. On the chromosome level, there may be missing (deletions) or extra (duplications) pieces of the chromosomes. There can also be single gene causes. Thus far Jalecia has had chromosomal analysis (through microarray) which was normal. She has also undergone testing of both the nuclear genes and mitochondrial genes through whole exome sequencing with mitochondrial analysis. All testing has been nondiagnostic at this time. (Note: Vonita underwent whole exome sequencing while her brother underwent whole genome sequencing. Exome focuses on only the exons of the genes, which makes up about 1-2% of the DNA. 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 Variants of uncertain significance (VUS) represent alterations in a gene that have not been seen with sufficient frequency to know with certainty whether they do or do not contribute to a specific cause of disease. Over time as more is learned about the variant, the lab will hopefully be able to classify the variant as either harmless (benign) or disease causing (pathogenic).  Pathogenic variants in MT-ND6 can be associated with a variety of mitochondrial disorders. Homoplasmic variants are present in all mitochondria, whereas heteroplasmic are only present in some. Shamyra's particular variant was also identified in her brother and in her mother at a homoplasmic level. At this time, Jazmen's features do not appear to fit one unifying diagnosis. Additionally, her mother does not have the same features as Yara and her brother despite having the homoplasmic variant. Also, Terryn and Tereasa Coop have an asymptomatic sibling who presumably  also has the same homoplasmic variant (since inherited from the mother). This seems to suggest that this variant may not be the cause of the siblings symptoms, but it remains a variant of uncertain significance at this time. Further, there has been no evidence of mitochondrial dysfunction by genetic testing, biochemical testing or muscle biopsy in [place].  There has been question of possible myasthenia gravis for which further clinical evaluation is ongoing. Definitive genetic etiologies of myasthenia gravis remain unclear at this time.  In summary, a particular genetic cause of Heran or her brother's symptoms has not been identified. There has been consideration of whether a mitochondrial disorder is present but testing thus far has been unrevealing. Rather, the possibility of a collagen disorder (such as Ehlers Danlos syndrome) or other neuromuscular process has been considered by various genetics doctors. Of the genes known to cause various types of EDS testing has been negative. However, the gene for hypermobile EDS is not known at this time and therefore genetic testing for this type is not possible. Debanhi also does not meet clinical criteria for hypermobile Ehlers Danlos Syndrome today.   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 We have requested that the family share Analicia's exome results with [place]. Once received, we will reach out to the performing laboratory for specific comment on the GLA gene, which is associated with Fabry disease. Enzymatic testing would not be beneficial in females as the condition is X-linked.  We recommend that Elysia return in two years for reanalysis of the exome and mitochondrial data to determine if any additional findings can be discovered. In the meantime, the family should continue following with their doctors for symptom management. We also  requested that the family share copies of Addalyne's genetic testing with [place] for review and  to aid in ordering of exome reanalysis in the future. The family is encouraged to reach out with any updates otherwise.  Recommendations: No additional genetic testing at this time Follow-up in 2 years, sooner if new medical diagnoses arise Mom to send [place] copies of all genetic testing reports for our review and records Once records are received, I will ask the lab who performed the exome to comment on the GLA gene (Fabry disease)   Charline Bills, MS, [facility] Certified Genetic Counselor  Loletha Grayer, D.O. Attending Physician, Medical [facility] Date: 07/04/2020 Time: 1:45pm   Total time spent: 80 minutes Time spent includes face to face and non-face to face care for the patient on the date of this encounter (history and physical, genetic counseling, coordination of care, data gathering and/or documentation as outlined)

## 2020-06-22 ENCOUNTER — Encounter (INDEPENDENT_AMBULATORY_CARE_PROVIDER_SITE_OTHER): Payer: Self-pay | Admitting: Pediatric Genetics

## 2020-06-22 ENCOUNTER — Other Ambulatory Visit: Payer: Self-pay

## 2020-06-22 ENCOUNTER — Ambulatory Visit (INDEPENDENT_AMBULATORY_CARE_PROVIDER_SITE_OTHER): Payer: Commercial Managed Care - PPO | Admitting: Pediatric Genetics

## 2020-06-22 VITALS — Ht 63.98 in | Wt 115.0 lb

## 2020-06-22 DIAGNOSIS — R5382 Chronic fatigue, unspecified: Secondary | ICD-10-CM | POA: Diagnosis not present

## 2020-06-22 DIAGNOSIS — Z7183 Encounter for nonprocreative genetic counseling: Secondary | ICD-10-CM | POA: Diagnosis not present

## 2020-06-22 DIAGNOSIS — H9313 Tinnitus, bilateral: Secondary | ICD-10-CM

## 2020-06-22 DIAGNOSIS — Z1371 Encounter for nonprocreative screening for genetic disease carrier status: Secondary | ICD-10-CM

## 2020-06-22 DIAGNOSIS — M6281 Muscle weakness (generalized): Secondary | ICD-10-CM

## 2020-06-22 DIAGNOSIS — R600 Localized edema: Secondary | ICD-10-CM | POA: Diagnosis not present

## 2020-06-28 ENCOUNTER — Telehealth (INDEPENDENT_AMBULATORY_CARE_PROVIDER_SITE_OTHER): Payer: Self-pay | Admitting: Family

## 2020-06-28 NOTE — Telephone Encounter (Signed)
I received a call from [name] with [facility] while at home visit with patient. Kyree reports episodes of feeling weak. Mom said that she had similar episodes when she was young and had hypoglycemia, so she is going to start checking blood sugars. Gennie receives overnight g-tube feedings so is not fasting in the mornings so [name] talked with Gerarda Gunther and mom about when to check blood sugars during the day - for example before meals and 2 hours after meals, and before snacks if she is symptomatic. Mom will keep a log and will share results after she has compiled some information.   Dr Fransico Michael, do you have other recommendations? Thanks, [name]

## 2020-07-03 ENCOUNTER — Encounter (INDEPENDENT_AMBULATORY_CARE_PROVIDER_SITE_OTHER): Payer: Self-pay

## 2020-07-03 ENCOUNTER — Telehealth (INDEPENDENT_AMBULATORY_CARE_PROVIDER_SITE_OTHER): Payer: Self-pay

## 2020-07-03 MED ORDER — RABEPRAZOLE SODIUM 20 MG PO TBEC: DELAYED_RELEASE_TABLET | ORAL | 6 refills | Status: DC

## 2020-07-04 ENCOUNTER — Encounter (INDEPENDENT_AMBULATORY_CARE_PROVIDER_SITE_OTHER): Payer: Self-pay | Admitting: Pediatric Genetics

## 2020-07-04 NOTE — Telephone Encounter (Signed)
I received an update from Chena's nurse. She said that Lyanna has recorded blood sugars from 90-113. The reading of 113 was a 2 hour post meal in which she had a sandwich, chips and a honeybun. She continues to have episodes of feeling weak, and afterwards is fairly exhausted for the day. She recently helped with cleaning out a car, worked only a short time and became so tired that she had to lie down for the remainder of the day.   Dr Fransico Michael, do you have recommendations? Thanks, [name]

## 2020-07-05 ENCOUNTER — Other Ambulatory Visit (HOSPITAL_COMMUNITY): Payer: Self-pay | Admitting: General Surgery

## 2020-07-05 DIAGNOSIS — Z95828 Presence of other vascular implants and grafts: Secondary | ICD-10-CM

## 2020-07-06 ENCOUNTER — Other Ambulatory Visit: Payer: Self-pay

## 2020-07-06 ENCOUNTER — Ambulatory Visit (HOSPITAL_COMMUNITY)
Admission: RE | Admit: 2020-07-06 | Discharge: 2020-07-06 | Disposition: A | Discharge status: Home / Self Care | Payer: Commercial Managed Care - PPO | Source: Ambulatory Visit | Attending: General Surgery | Admitting: General Surgery

## 2020-07-06 DIAGNOSIS — Z95828 Presence of other vascular implants and grafts: Secondary | ICD-10-CM

## 2020-07-06 DIAGNOSIS — Z452 Encounter for adjustment and management of vascular access device: Secondary | ICD-10-CM | POA: Diagnosis present

## 2020-07-06 HISTORY — PX: IR CV LINE INJECTION: IMG2294

## 2020-07-06 IMAGING — XA IR CENTRAL VENOUS CATHETER
1 series · 1 of 1 positions shown · IV contrast (IODINE)
Comparison: none

CLINICAL DATA: Status post surgical placement of right-sided
Port-A-Cath on [DATE]. The patient complains of sensation of
swelling in the right neck near the port catheter insertion site in
the jugular vein.

[Series 300: line placements · 1 of 1 slices shown]
[im 1/1]
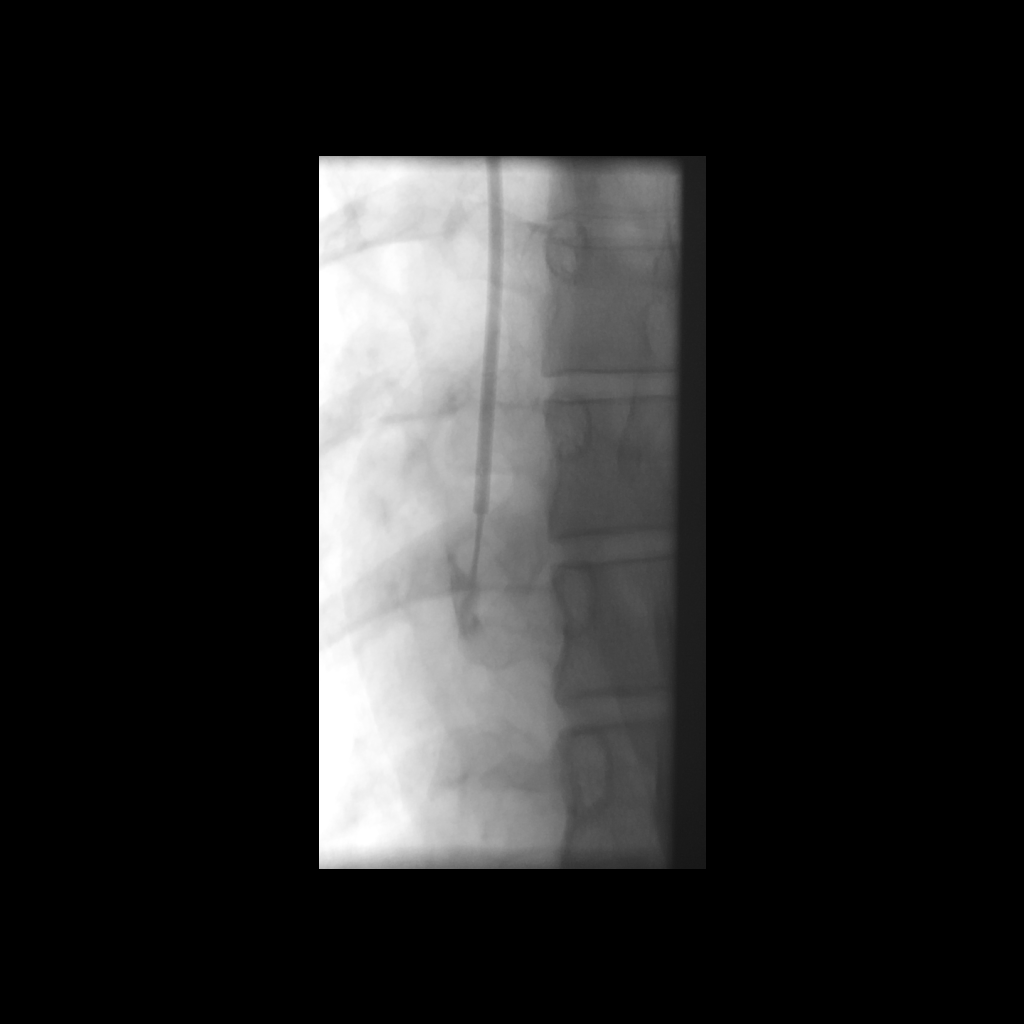

[1 of 1 positions shown; findings below may reference images not displayed]

EXAM:
CONTRAST INJECTION OF PORT A CATH UNDER FLUOROSCOPY

CONTRAST:  7 mL Omnipaque 300

FLUOROSCOPY TIME:  18 seconds.  16.0 mGy.

PROCEDURE:
Initial fluoroscopy was performed of the indwelling Port-A-Cath.
Contrast was administered via the indwelling port after it was
accessed. Fluoroscopic spot images were obtained of the catheter
during injection.
FINDINGS: The port reservoir and attached catheter are normally patent. During
contrast injection, no contrast extravasation is identified along
the course of the catheter or at the level of the subcutaneous port.
Magnified [HOSPITAL] the level of the tip of the Port-A-Cath shows
free flow of contrast from the tip at the level of the SVC/RA
junction. No evidence of significant fibrin sheath or catheter
occlusion.
IMPRESSION: Normal port injection study demonstrating no evidence of port
catheter malpositioning, contrast extravasation or occlusion.

## 2020-07-06 MED ORDER — HEPARIN SOD (PORK) LOCK FLUSH 100 UNIT/ML IV SOLN
INTRAVENOUS | Status: AC
  Administered 2020-07-06: 500 [IU]
  Filled 2020-07-06: qty 5

## 2020-07-12 ENCOUNTER — Other Ambulatory Visit (INDEPENDENT_AMBULATORY_CARE_PROVIDER_SITE_OTHER): Payer: Self-pay | Admitting: Family

## 2020-07-12 ENCOUNTER — Other Ambulatory Visit (INDEPENDENT_AMBULATORY_CARE_PROVIDER_SITE_OTHER): Payer: Self-pay | Admitting: Pediatrics

## 2020-07-17 ENCOUNTER — Encounter: Payer: Self-pay | Admitting: *Deleted

## 2020-07-24 NOTE — Progress Notes (Signed)
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   Brief History:  Laura Parrish is a young woman with complicated history including Chairi malformation s/p repair, tethered cord s/p repair, feeding intolerance s/p gastrostomy tube, lactose intolerance and alpha gal allergy,  multiple hormonal irregularities including hypothyroidism, chronic headaches, chronic Gi complaints, neuropathic pain in her feet,  and multiple other symptoms concerning for mitochondrial diease although genetic testing and muscle biopsy unrevealing. She is undergoing testing for suspected myasthenia gravis and has responded well to treatment with Mestinon. We also have growing concern for connective tissue disease, however this has not been diagnostically proven either. She now receives fluid infusion of D5 1/2 NS at 250cc/hr for 12 hrs via portacath every 2 weeks for energy failure and related to inability to tolerate enteral fluids, and has been much more stable with these infusions.    Baseline Function: Cognitive -age appropriate responses to questions, attention and comprehension appropriate Neurologic - cognitively normal, has chronic fatigue, has intermittent headaches, chronic leg pain Endocrine - has hypothyroidism and abnormal menses Communication -fluent speech Cardiovascular -normal- port a cath in place to receive medications and fluids as needed (05/27/2018) Vision -normal, however has regular screening Hearing -normal Pulmonary - history of asthma GI - Feeding intolerance with variable diarrhea and constipation, has a feeding tube in place Urinary -normal Motor -normal function, although low endurance.    Guardians/Caregivers: Margit Batte (mother) ph [phone] Tabor Bartram (father) ph [phone]  Care Needs/Upcoming Plan: 06/19/20 referred to [facility] for EMG- do not see that it was completed CAP-DA- started 08/27/20 9:45 AM Dr. Fransico Michael 09/07/20 9:45 AM Dr. Corliss Skains 09/11/2020 2:00 PM Dr. Dalbert Garnet  Recent Events ANA, double-stranded DNA, rheumatoid factor and anti-CCP negative Seen by Dr. Roetta Sessions- f/u 2 yrs mom was to bring records  Feeding: Last updated: 07/26/2020 DME: [employer]-fax [phone] Formula: Ensure Clear  Current regimen:  Day feeds: PO foods Overnight feeds: 720 mL (3 cans) + 120 ml of water @ 85 mL/hr x 10-11 hours  FWF:   Notes: Pt receives oral foods during the day, but relies of enteral nutrition overnight to meet nutritional needs. Supplements: MVI + iron, Vitamin B2, Co-Q10, and Calcium Goal to add Ensure clear, and take 32oz water in the few days leading up to infusions  Symptom management/Treatments: Neurological - Topiramate for migraine prevention, Maxalt, Ondansetron and Ibuprofen for rescue; Lyrica for neuropathic pain in her legs; Skelaxin for body pain; Fluid infusions every 2 weeks; needs frequent rest periods, IV fluids with D10 1/2 NS at 125 ml/hr x 24 hrs or 250 ml/hr x 12 hrs.  Endocrine - on Synthroid for hypothyroidism Pulmonary - Astelin nasal spray, Xyzal, albuterol nebulizer ([employer]) works better than inhalers GI - Cromolyn, Ensure supplements, Miralax for constipation Musculoskeletal- Mestinon for weakness, muscle patches left neck, Skelaxin  TMJ- seeing ortho and dentist Sleep-  Trazadone 25-50 mg per PCP  Past/failed meds: Periactin caused severe fatigue and failed to control headaches Gabapentin less helpful for neuropathic pain Zomig - ineffective Maxalt - intermittently helpful better than Imitrex Skelaxin helpful at times but causes  sleepiness Imitrex- caused dizziness does not like  Providers: Verdene Lennert, MD (PCP) [facility]  Health Internal Medicine)- ph. [phone] Fax [phone] [name], MD [facility] Child Neurology and Pediatric Complex Care) ph [phone] fax [phone] John Giovanni, RD [facility] Health Pediatric Complex Care dietitian) ph [phone] fax [phone] [name] NP-C [facility] Pediatric Complex Care) ph [phone] fax [phone] [name] Essex, MD [facility] Cardiology) ph [phone] fax [phone] [facility] Surgery-ph. [phone] fax [phone] -portacath and G tube Truitt Merle, MD [facility] Gastroenterology) ph. [phone] fax-[phone] Dennie Fetters MD ([facility]) ph. [phone] fax [phone] Molli Knock, MD [facility] Health Pediatric Endocrinology) ph [phone] fax [phone] [facility] Dermatology Ph [phone] Fax- [phone].  Christeen Douglas, MD [facility] Gynecology) ph [phone] fax [phone] (referred - not yet seen)  Vira Blanco, MD [facility] Otolaryngology) ph. [phone] Fax [phone] Pollyann Savoy, MD (Rheumatology) ph. [phone] fax [phone] Loletha Grayer, [facility] Pediatric Genetics) ph. [phone]  Community support/services: [facility] [phone] fax [phone] home health nursing by [name]; portacath and infusion supplies [facility]: Caprice Renshaw-  [facility]  Equipment: [facility]: [phone] fax: [phone]-  G-tube supplies, Port A Cath supplies, CADD pump for IV fluids, Feeding pump, Pulse ox, IV fluids and Pediasure [employer]: ph. 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 2. Cervical spine syrinx, more completely evaluated on dedicated MR of the cervical spine, reported separately. Repeat 7/09: unchanged CSpine MRI 4/08: syrinx is again seen spanning the C5 to T1 levels . Repeat 1/10: unchanged. EMG/NCV Normal study.  11/22/09 SSEP These lower extremity somatosensory evoked potentials are at least borderline abnormal for an increased absolute latency of the P37 responses and the L1 - P37 interpeak latencies with both left and right posterior tibial nerve stimulation. NCV/EMGs 2008: Borderline study: no electrodiagnostic evidence of sensory or motor neuropathy. Abnormal peroneal nerve responses & F-wave abnormalities are of unclear significance but could represent mild peroneal mononeuropathies of the branch to the EDB muscle bilaterally.  Muscle bx 2009: Skeletal muscle, right leg, biopsy Mild variation in fiber size, more notable in Type 1 fibers with rare atrophic Type 1 fibers EM: The normal sarcomeric architecture is preserved. Mitochondria are morphologically unremarkable. No abnormal accumulations of glycogen or lipid are seen. There are scattered mildly dilated structures between fibrils which may represent sarcotubular profiles EEG: normal awake and asleep (2010) PSG: normal sleep. 06/16/2019- Field Vision Test- normal, Normal inner and outer retina OU 06/23/2019 EMG test Brenners-I Diminished fibular motor responses possibly due to hx of tethered cord, chronic bracing and is a nonspecific finding but no evidence of myopathy or generalized neuropathy. 11/01/2019 Otolaryngology: TMJ,              Audiology exam LEFT ear:Thresholds consistent with normal hearing. Speech testing gave an SRT of 5 dBHL and WRS of 100% presented at 50 dBHL, using a NU-6 wordlist.            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  Speech testing gave an  SRT of 10 dBHL and WRS of 100% presented at 50 dBHL, using a NU-6  wordlist.  12/01/2019 Thyroid Ultrasound IMPRESSION: Small heterogenous thyroid. No indication for biopsy or dedicated imaging follow-up. 12/19/2019 Pelvic Ultrasound IMPRESSION:Trace free fluid in the pelvis. Otherwise negative pelvic ultrasound. 06/03/20 Echo: normal  [name] NP-C and [name], MD Pediatric Complex Care Program Ph: [phone] Fax: [phone]

## 2020-07-25 NOTE — Telephone Encounter (Signed)
Records shredded per notes below.

## 2020-07-26 ENCOUNTER — Ambulatory Visit (INDEPENDENT_AMBULATORY_CARE_PROVIDER_SITE_OTHER): Payer: Commercial Managed Care - PPO

## 2020-07-26 ENCOUNTER — Ambulatory Visit (INDEPENDENT_AMBULATORY_CARE_PROVIDER_SITE_OTHER): Payer: Commercial Managed Care - PPO | Admitting: Dietician

## 2020-07-26 ENCOUNTER — Ambulatory Visit (INDEPENDENT_AMBULATORY_CARE_PROVIDER_SITE_OTHER): Payer: Commercial Managed Care - PPO | Admitting: Pediatrics

## 2020-07-26 ENCOUNTER — Other Ambulatory Visit: Payer: Self-pay

## 2020-07-26 ENCOUNTER — Encounter (INDEPENDENT_AMBULATORY_CARE_PROVIDER_SITE_OTHER): Payer: Self-pay | Admitting: Pediatrics

## 2020-07-26 VITALS — BP 110/67 | HR 80 | Temp 96.4°F | Resp 14 | Ht 64.13 in | Wt 118.6 lb

## 2020-07-26 DIAGNOSIS — G43009 Migraine without aura, not intractable, without status migrainosus: Secondary | ICD-10-CM

## 2020-07-26 DIAGNOSIS — Z95828 Presence of other vascular implants and grafts: Secondary | ICD-10-CM | POA: Diagnosis not present

## 2020-07-26 DIAGNOSIS — N946 Dysmenorrhea, unspecified: Secondary | ICD-10-CM | POA: Diagnosis not present

## 2020-07-26 DIAGNOSIS — E8840 Mitochondrial metabolism disorder, unspecified: Secondary | ICD-10-CM

## 2020-07-26 DIAGNOSIS — G7000 Myasthenia gravis without (acute) exacerbation: Secondary | ICD-10-CM

## 2020-07-26 DIAGNOSIS — Z7189 Other specified counseling: Secondary | ICD-10-CM

## 2020-07-26 DIAGNOSIS — E162 Hypoglycemia, unspecified: Secondary | ICD-10-CM

## 2020-07-26 DIAGNOSIS — M792 Neuralgia and neuritis, unspecified: Secondary | ICD-10-CM

## 2020-07-26 MED ORDER — PREGABALIN 25 MG PO CAPS: 25.0000 mg | ORAL_CAPSULE | Freq: Two times a day (BID) | ORAL | 5 refills | Status: DC

## 2020-07-26 MED ORDER — PREGABALIN 75 MG PO CAPS: ORAL_CAPSULE | ORAL | 5 refills | Status: DC

## 2020-07-26 MED ORDER — PROMETHAZINE HCL 25 MG PO TABS: ORAL_TABLET | ORAL | 0 refills | Status: DC

## 2020-07-26 MED ORDER — PYRIDOSTIGMINE BROMIDE 60 MG PO TABS: 60.0000 mg | ORAL_TABLET | Freq: Four times a day (QID) | ORAL | 5 refills | Status: DC

## 2020-07-26 NOTE — Progress Notes (Signed)
Patient: Laura Parrish MRN: [id number] Sex: female DOB: 07/14/2000  Provider: [name], MD Location of Care: Pediatric Specialist- Pediatric Complex Care Note type: Routine return visit  History of Present Illness: Referral Source: [name], MD History from: patient and prior records Chief Complaint: complex care  Laura Parrish is a 20 y.o. 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 I have recently been working to access symptoms of myasthenia gravis. Patient was last seen 02/23/20.   Patient presents today with mother. They report their largest concern is episodes of hypoglycemia.  If she goes longer than 1-2 hours, she starts to feel run down and  weak.  When she eats, she feels better.  She has ha difficulties with measuring blood sugars because she has poor bloodflow to hands and feet.  Family wondering if she could have a continuous glucose monitor to keep better tabs on her blood glucose.    Symptom management:  Otherwise, doing well. Headaches, leg pain, gyn bleeding all well controlled.  Sleep is improved.    Care coordination (other providers): Mother has been unable to schedule EMG at [facility].  Previous EMG x2 at [facility] did not complete single fiber EMG for myasthenia.   Seen by Dr Roetta Sessions in June, very happy with this appointment and plan to continue there.  No longer seeing mitchondrial disease specialist in [place]. Follow-up with Rheumatology, Cardiology since last appointment. Schedule to see Dr Fransico Michael next month, need to follow-up with Gyn.   Care  management needs:  Upcoming IV fluids infusion with home health nurse, requesting yearly lab orders to be drawn when port is accessed.   Equipment needs:  DME orders needed for feeding supplies.   Social:  Patient returning to college in the fall.   Past Medical History Past Medical History:  Diagnosis Date   Allergy to alpha-gal    elevated IgE 04/10/16   Autonomic dysfunction    Chiari I malformation (HCC)    Complication of anesthesia    slow to wake up   Dyspnea    Eczema    GERD (gastroesophageal reflux disease)    Headache    Heart murmur    Hypoglycemia    Hypothyroid    IBS (irritable bowel syndrome)    Joint pain    Mitochondrial disease (HCC)    Muscle pain    Pneumonia    several times   PONV (postoperative nausea and vomiting)     Surgical History Past Surgical History:  Procedure Laterality Date   ADENOIDECTOMY     2004or 2005   chiari decompression  2007   GALLBLADDER SURGERY  2015   GASTROSTOMY TUBE PLACEMENT     x 2   IR CV LINE INJECTION  07/06/2020   MUSCLE BIOPSY  2010   Port Replacement  05/2018   x 2 orignal 08/2016 then replacement 05/2018   PORT-A-CATH REMOVAL N/A 03/24/2019   Procedure: PORT-A-CATH REMOVAL;  Surgeon: Rodman Pickle, MD;  Location: [facility] OR;  Service: General;  Laterality: N/A;   PORTACATH PLACEMENT Right 03/24/2019   Procedure: PORT-A-CATH INSERTION under ultrasound and fluoroscopic guidance;  Surgeon: Rodman Pickle, MD;  Location: [facility]  OR;  Service: General;  Laterality: Right;   REMOVAL OF GASTROSTOMY TUBE N/A 03/24/2019   Procedure: Exchange of Gastrostomy Tube;  Surgeon: Kinsinger, De Blanch, MD;  Location: [facility] OR;  Service: General;  Laterality: N/A;   tethered cord  07/2006   TONSILLECTOMY     [id number]    Family History family history includes Allergic rhinitis in her brother, maternal grandmother, mother, and sister; Asthma in her brother, maternal grandmother, and mother; Diabetes type II in her maternal  grandmother; Heart murmur in her mother; Lung cancer in her maternal grandfather; Mitochondrial disorder in her brother; Seizures in her brother.   Social History Social History   Social History Narrative   Laura Parrish graduated from [employer]. Attends [employer]. She is majoring in History This is her senior year!    She enjoys reading, playing with her cat and her dog, and hanging out with her sister.     Allergies Allergies  Allergen Reactions   Other Anaphylaxis    Alpha Gal allergy \ Red meat\ any hoof animal   Latex Rash   Lactated Ringers     Contraindicated w/ mitochondrial syndrome   Propofol Other (See Comments)    Due to possible mitochondrial disorder Due to possible mitochondrial disorder Due to possible mitochondrial disorder   Tape Itching and Other (See Comments)    Medications Current Outpatient Medications on File Prior to Visit  Medication Sig Dispense Refill   ascorbic acid (VITAMIN C) 500 MG tablet Take 500 mg by mouth daily.      azelastine (ASTELIN) 0.1 % nasal spray Place 2 sprays into both nostrils 2 (two) times daily. Use in each nostril as directed 30 mL 5   Coenzyme Q-10 200 MG CAPS Take 1 capsule by mouth daily.     leucovorin (WELLCOVORIN) 25 MG tablet TAKE 1 TABLET BY MOUTH 2 TIMES A DAY 180 tablet 0   levOCARNitine (CARNITOR) 1 GM/10ML solution TAKE 2 TEASPOONFUL ( ) BY MOUTH TWICEDAILY (Patient taking differently: Place 1,000 mg into feeding tube daily.) 118 mL 12   levonorgestrel-ethinyl estradiol (SEASONALE) 0.15-0.03 MG tablet Take 1 tablet by mouth daily. 90 tablet 2   lidocaine (XYLOCAINE) 5 % ointment Apply 1 application topically as needed. 35.44 g 3   metaxalone (SKELAXIN) 800 MG tablet TAKE 1/2 TABLET (400MG  TOTAL DOSE) BY MOUTH EVERY DAY 15 tablet 3   ondansetron (ZOFRAN-ODT) 4 MG disintegrating tablet Take 1 tablet under the tongue every 6-8 hours as needed for nausea. 30 tablet 5   PRESCRIPTION MEDICATION Apply 1  application topically as needed (port). Elma- cream      RABEprazole (ACIPHEX) 20 MG tablet TAKE ONE TABLET TWICE DAILY. 60 tablet 6   rizatriptan (MAXALT) 10 MG tablet Take 1 tablet (10 mg total) by mouth as needed for migraine (May repeat in 2 hours if needed). May repeat in 2 hours if needed 12 tablet 3   topiramate (TOPAMAX) 25 MG tablet TAKE 1 TABLET BY MOUTH EACH NIGHT AT BEDTIME 30 tablet 3   traZODone (DESYREL) 50 MG tablet Take half a tablet 30 minutes before bedtime. If needed, may increase to 1 tablet before bedtime. 30 tablet 1   TROKENDI XR 50 MG CP24 Take 1 capsule per day. This is in addition to the Topiramate 25mg  tablet at bedtime 30 capsule 5   albuterol (ACCUNEB) 0.63 MG/3ML nebulizer solution Take 3ml by nebulizer 3 times per day as needed for shortness of breath 270 mL 5   albuterol (PROVENTIL HFA)  108 (90 Base) MCG/ACT inhaler Inhale 2 puffs into the lungs every 4 (four) hours as needed for wheezing or shortness of breath. (Patient not taking: Reported on 11/17/2019) 6.7 g 2   clobetasol (OLUX) 0.05 % topical foam Apply topically 2 (two) times daily. Apply to affected areas twice daily as needed taking care to avoid axillae and groin area. 45 g 3   EPINEPHrine 0.3 mg/0.3 mL IJ SOAJ injection Inject 0.3 mg into the muscle once. As needed     Nutritional Supplement LIQD Place 720 mLs into feeding tube as directed. Dispense Ensure Clear for nightly tube feedings of 720 ml at 85 ml/hr 216000 mL 12   No current facility-administered medications on file prior to visit.   The medication list was reviewed and reconciled. All changes or newly prescribed medications were explained.  A complete medication list was provided to the patient/caregiver.  Physical Exam BP 110/67   Pulse 80   Temp (!) 96.4 F (35.8 C)   Resp 14   Ht 5' 4.13" (1.629 m)   Wt 118 lb 9.7 oz (53.8 kg)   LMP 05/27/2020   SpO2 100%   BMI 20.27 kg/m  Weight for age: Facility age limit for growth percentiles is  20 years.  Length for age: Facility age limit for growth percentiles is 20 years. BMI: Body mass index is 20.27 kg/m. No results found. General: NAD, well nourished  HEENT: normocephalic, no eye or nose discharge.  MMM  Cardiovascular: warm and well perfused Lungs: Normal work of breathing, no rhonchi or stridor Skin: No birthmarks, no skin breakdown Abdomen: soft, non tender, non distended Extremities: No contractures or edema. Neuro: EOM intact, face symmetric. Moves all extremities equally and at least antigravity. No abnormal movements. Normal gait.     Diagnosis:  1. Port-A-Cath in place   2. Migraine without aura and without status migrainosus, not intractable   3. Mitochondrial disease (HCC)   4. Dysmenorrhea   5. Myasthenia gravis (HCC)   6. Neuropathic pain   7. Hypoglycemia      Assessment and Plan Laura Parrish is a 20 y.o. female with history of Chairi malformation s/p repair, tethered cord s/p repair, feeding intolerance s/p gastrostomy tube, and multiple symptoms concerning for mitochondrial disease, however evaluation has thus been negative who presents for follow-up in the pediatric complex care clinic.  Patient seen by case manager today as well, please see accompanying notes. Patient has had recent symptoms consistent with myasthenia gravis, such as limited pulmonary force and fatigue with repetitive movement.  We have been unable to complete a single fiber EMG.  I communicated with Dr Damita Lack, her pulmonologist, personally today requesting that he order and EMG internally at [facility] so we can finalize this evaluation.  Today, she complains of symptoms of hypoglycemia if she does not eat almost continuously, which may also be contributing to symptoms.We may be able to try repetitive blood draws or continuous glucose monitor to further evaluate this problem.  Otherwise, her neurologic symptoms are stable and I recommend she stay her same home regimen.    Symptom  management:  I will speak with Dr Fransico Michael about her perceived hypoglycemia symptoms when going 1-2 hours without food.  Consider continuous glucose monitor for further evaluation.  Continue all other medications.  Refills placed today for Lyrica for nerve pain, phenergan for nausea associated with headaches, and mestinon for muscle weakness.    Care coordination: I have already reached out to Dr Damita Lack about the  EMG, will follow-up results.   Care management needs:  Labwork ordered for blood draw.  Will send to home health nurse.   Equipment needs:  DME ordered for feeding supplies.  Patient continues to require gtube feeds due to feeding intolerance orally.   The CARE PLAN for reviewed and revised to represent the changes above.  This is available in Epic under snapshot, and a physical binder provided to the patient, that can be used for anyone providing care for the patient.   I spend 40 minutes on day of service on this patient including review of chart, discussion with patient and family, coordination with other providers and management of orders and paperwork.   Return in about 6 months (around 01/26/2021). Laura Parrish will follow-up via phone regarding EMG and continuous glucose monitor.   [name] MD MPH Neurology,  Neurodevelopment and Neuropalliative care [facility] Child Neurology  [address] [place], [place], [place] [address] Phone: [phone]

## 2020-07-26 NOTE — Patient Instructions (Addendum)
I will speak with Dr Fransico Michael about her hypoglycemia I have already reached out to Dr Damita Lack about the EMG Continue all other medications Labwork ordered for blood draw DME ordered for feeding supplies Maralyn Sago will follow-up with you via phone.  I will follow-up with you in 6 months

## 2020-07-27 ENCOUNTER — Encounter (INDEPENDENT_AMBULATORY_CARE_PROVIDER_SITE_OTHER): Payer: Self-pay

## 2020-07-27 ENCOUNTER — Telehealth (INDEPENDENT_AMBULATORY_CARE_PROVIDER_SITE_OTHER): Payer: Self-pay

## 2020-07-27 MED ORDER — NUTRITIONAL SUPPLEMENT PO LIQD: 720.0000 mL | ORAL | 12 refills | Status: DC

## 2020-07-27 NOTE — Telephone Encounter (Signed)
Call to [facility] EMG lab- they report they have the referral but they have 2 schedulers out and not sure when they will get to her name. They have a person who is working on the list but may be awhile. She could not give RN a time frame. RN updated Tawanna.

## 2020-07-30 NOTE — Addendum Note (Signed)
Addended by: Vita Barley B on: 07/30/2020 05:05 PM   Modules accepted: Orders

## 2020-07-31 ENCOUNTER — Telehealth (INDEPENDENT_AMBULATORY_CARE_PROVIDER_SITE_OTHER): Payer: Self-pay | Admitting: Pharmacist

## 2020-07-31 ENCOUNTER — Other Ambulatory Visit (INDEPENDENT_AMBULATORY_CARE_PROVIDER_SITE_OTHER): Payer: Self-pay | Admitting: "Endocrinology

## 2020-07-31 DIAGNOSIS — E063 Autoimmune thyroiditis: Secondary | ICD-10-CM

## 2020-07-31 DIAGNOSIS — E162 Hypoglycemia, unspecified: Secondary | ICD-10-CM

## 2020-07-31 NOTE — Telephone Encounter (Signed)
Patient will require Dexcom G6 CGM prior authorizations for [facility] AND  Medicaid as patient has dual insurance. Patient is currently struggling with concerns for persistent hypoglycemia. Prior authorizations will likely get denied and I will have to complete appeal. Family is aware of process and likelihood extended timeframe this may take.   Will route note to Angelene Giovanni, RN, for assistance to complete prior authorization (assistance appreciated).  Thank you for involving clinical pharmacist/diabetes educator to assist in providing this patient's care.   Zachery Conch, PharmD, BCACP, CDCES, CPP

## 2020-07-31 NOTE — Telephone Encounter (Signed)
Contacted family.  Explained Dr. Fransico Michael would like to initiate patient on CGM. Discussed CGM options. Patient would prefer to initiate Dexcom G6 CGM.   Will work on Community education officer as patient will require prior authorization for 1) [employer] 2) Medicaid as patient has two insurances. Then she will need appeals for both. Explained insurance process can take time.  Will schedule CGM training appt on 08/27/20 as patient has appt with Dr. Fransico Michael this day. If we have insurance approval for Dexcom we will plan to start Dexcom. If we do not have insurance approval at the time we will start Freestyle Libre 2.0 CGM (only CGM I have adequate supply of samples). Family verbalized understanding.  Thank you for involving clinical pharmacist/diabetes educator to assist in providing this patient's care.   Zachery Conch, PharmD, BCACP, CDCES, CPP

## 2020-08-01 NOTE — Telephone Encounter (Signed)
Initiated prior authorizations through [employer]   Faxed paperwork and notes to [facility]

## 2020-08-03 ENCOUNTER — Encounter: Payer: Self-pay | Admitting: Pediatrics

## 2020-08-06 MED ORDER — DEXCOM G6 SENSOR MISC: 1.0000 | 11 refills | Status: DC

## 2020-08-06 MED ORDER — DEXCOM G6 TRANSMITTER MISC: 1.0000 | 3 refills | Status: DC

## 2020-08-06 MED ORDER — DEXCOM G6 RECEIVER DEVI: 1.0000 | 2 refills | Status: DC

## 2020-08-06 NOTE — Addendum Note (Signed)
Addended by: Buena Irish on: 08/06/2020 11:04 AM   Modules accepted: Orders

## 2020-08-06 NOTE — Telephone Encounter (Signed)
Will send in Dexcom G6 CGM prescriptions to preferred local pharmacy.  [facility] - [place], [place] - [address]  [address] [place], [place] [address]  Phone:  [phone]  Fax:  [phone]  DEA #:  --  DAW Reason: --    Followed up with local pharmacy to determine copay.  Pharmacist stated rejection saying "drug not covered at this location".   Contacted patient's pharmacy primary insurance [facility]; [phone]). Spoke with Lany.  [facility] is not an in Teacher, adult education with [employer]. Lany informed me that [facility], [facility], or [facility] pharmacy is within network with [employer] and is located in [place], [place].   Asked Lany for test claim for Dexcom G6 CGM. Tamala Fothergill stated there was an error running test claim. Specific error stated "relationship code 5, person code 4."  Lany could not resolve issue so asked Lany to transfer me to her supervisor. Person code should be 5. Supervisor was able to run test claim  Dexcom G6 CGM supplies -Sensors (3 for 30 days): $80 for 30 day supply  -Transmitter (1 for 90 days): $80 for 90 day supply  -Receiver (1 for 365 days): $80 for 365 day supply   Freestyle Libre 2.0 CGM supplies -Sensors (2 for 28 days): $59.22 for 30 day supply   Called patient's mother on 08/06/2020 at 11:00 AM   Provided update. Mother was appreciative. Mother did inform me father will be changing insurance soon (primary insurance). Advised her to upload photo of her new insurnace card once she receives it. It is likely Imberly will remain having Medicaid as secondary insurance so I will start working on appeal for Medicaid at this time.   Thank you for involving clinical pharmacist/diabetes educator to assist in providing this patient's care.   Zachery Conch, PharmD, BCACP, CDCES, CPP

## 2020-08-09 ENCOUNTER — Ambulatory Visit: Payer: Commercial Managed Care - PPO | Admitting: Rheumatology

## 2020-08-10 ENCOUNTER — Telehealth (INDEPENDENT_AMBULATORY_CARE_PROVIDER_SITE_OTHER): Payer: Self-pay | Admitting: Pediatrics

## 2020-08-10 NOTE — Telephone Encounter (Signed)
Please call mom and let her know I received labs back for Kyleena and all were normal. Her ferritin, which shows iron stores, was sufficient but still on the low end.  I recommend she continue Vitron C daily.  If mother wants the results sent to her, they are in my scan box.   [name] MD MPH

## 2020-08-10 NOTE — Telephone Encounter (Signed)
Left voicemail for mom to call back

## 2020-08-13 NOTE — Telephone Encounter (Signed)
Spoke with mom and let her know per Dr. Artis Flock "Please call mom and let her know I received labs back for Laura Parrish and all were normal. Her ferritin, which shows iron stores, was sufficient but still on the low end.  I recommend she continue Vitron C daily.  If mother wants the results sent to her, they are in my scan box."   Mom states understanding and ended the call.

## 2020-08-15 ENCOUNTER — Encounter (INDEPENDENT_AMBULATORY_CARE_PROVIDER_SITE_OTHER): Payer: Self-pay

## 2020-08-16 LAB — CBC WITH DIFFERENTIAL/PLATELET
Absolute Monocytes: 423 cells/uL (ref 200–950)
Basophils Absolute: 58 cells/uL (ref 0–200)
Basophils Relative: 0.7 %
Eosinophils Absolute: 241 cells/uL (ref 15–500)
Eosinophils Relative: 2.9 %
HCT: 44.4 % (ref 35.0–45.0)
Hemoglobin: 14.7 g/dL (ref 11.7–15.5)
Lymphs Abs: 2366 cells/uL (ref 850–3900)
MCH: 28.7 pg (ref 27.0–33.0)
MCHC: 33.1 g/dL (ref 32.0–36.0)
MCV: 86.7 fL (ref 80.0–100.0)
MPV: 9.9 fL (ref 7.5–12.5)
Monocytes Relative: 5.1 %
Neutro Abs: 5212 cells/uL (ref 1500–7800)
Neutrophils Relative %: 62.8 %
Platelets: 351 10*3/uL (ref 140–400)
RBC: 5.12 10*6/uL — ABNORMAL HIGH (ref 3.80–5.10)
RDW: 11.9 % (ref 11.0–15.0)
Total Lymphocyte: 28.5 %
WBC: 8.3 10*3/uL (ref 3.8–10.8)

## 2020-08-16 LAB — COMPREHENSIVE METABOLIC PANEL
AG Ratio: 1.7 (calc) (ref 1.0–2.5)
ALT: 13 U/L (ref 6–29)
AST: 17 U/L (ref 10–30)
Albumin: 4.5 g/dL (ref 3.6–5.1)
Alkaline phosphatase (APISO): 63 U/L (ref 31–125)
BUN: 8 mg/dL (ref 7–25)
CO2: 19 mmol/L — ABNORMAL LOW (ref 20–32)
Calcium: 9.5 mg/dL (ref 8.6–10.2)
Chloride: 108 mmol/L (ref 98–110)
Creat: 0.82 mg/dL (ref 0.50–0.96)
Globulin: 2.7 g/dL (calc) (ref 1.9–3.7)
Glucose, Bld: 99 mg/dL (ref 65–139)
Potassium: 4 mmol/L (ref 3.5–5.3)
Sodium: 139 mmol/L (ref 135–146)
Total Bilirubin: 0.6 mg/dL (ref 0.2–1.2)
Total Protein: 7.2 g/dL (ref 6.1–8.1)

## 2020-08-16 LAB — TSH: TSH: 0.96 mIU/L

## 2020-08-16 LAB — PROLACTIN: Prolactin: 7.1 ng/mL

## 2020-08-16 LAB — IRON: Iron: 102 ug/dL (ref 40–190)

## 2020-08-16 LAB — T3, FREE: T3, Free: 2.8 pg/mL (ref 2.3–4.2)

## 2020-08-16 LAB — T4, FREE: Free T4: 1.3 ng/dL (ref 0.8–1.8)

## 2020-08-17 ENCOUNTER — Encounter (INDEPENDENT_AMBULATORY_CARE_PROVIDER_SITE_OTHER): Payer: Self-pay

## 2020-08-20 NOTE — Progress Notes (Addendum)
S:     Chief Complaint  Patient presents with   Hypoglycemia    Dexcom G6 CGM Training    Endocrinology provider: Dr. Tobe Sos (upcoming appt 08/27/20 9:45 am)  Patient referred to me by Dr. Tobe Sos for [place] training. PMH significant for migraine without aura, chronic rhinitis, thyroiditis, goiter, hyperprolactinemia, chiari I malformation, dysautonomia, syringomyelia and syringobulbia, tethered cord, myasthenia gravis, hyperhidrosis, ehlers-danlos syndrome, atopic dermatitis, alpha-gal hypersensitivity, mitochondrial disease, raynaud's phenomenon, lactose intolerance, iron deficiency anemia, menorrhagia, and insomnia.   Patient presents today with her mother Laretta Alstrom). Patient is having hypoglycemic episodes (BG ~50 mg/dL) sporadically throughout the day. Hypoglycemia occurs multiple times throughout the day and can even be 1 hour after tube feedings.   Insurance Coverage: recently changed Rayann Heman / [facility] for medical), unsure pharmacy however Rx[facility] is [phone] and PCN is [id number]  Preferred Pharmacy: [facility] [place], [place] - [address]  [place], [place] [address]  Phone:  [phone]  Fax:  [phone]  DEA #:  --  DAW Reason: --    Medication Adherence -Current diabetes medications include: none -Prior diabetes medications include: none  Patient denies taking hydroxyurea and/or >4 g of APAP.  Dexcom G6 patient education Person(s)instructed: mom, patient  Instruction: Patient oriented to three components of Dexcom G6 continuous glucose monitor (sensor, transmitter, receiver/cellphone) Receiver or cellphone: cell phone -Dexcom G6 AND dexcom clarity app downloaded onto cellphone  -Patient educated that Dexom G6 app must always be running (patient should not close out of app) -If using Dexcom G6 app, patient may share blood glucose data with up to 10 followers on dexcom follow app. Sensor code: [phone] Transmitter code:  [id number]  CGM overview and set-up  1. Button, touch screen, and icons 2. Power supply and recharging 3. Home screen 4. Date and time 5. Set BG target range: 70-300 6. Set alarm/alert tone  7. Interstitial vs. capillary blood glucose readings  8. When to verify sensor reading with fingerstick blood glucose 9. Blood glucose reading measured every five minutes. 10. Sensor will last 10 days 11. Transmitter will last 90 days and must be reused  12. Transmitter must be within 20 feet of receiver/cell phone.  Sensor application -- sensor placed on back of right arm 1. Site selection and site prep with alcohol pad 2. Sensor prep-sensor pack and sensor applicator 3. Sensor applied to area away from waistband, scarring, tattoos, irritation, and bones 4. Transmitter sanitized with alcohol pad and inserted into sensor. 5. Starting the sensor: 2 hour warm up before BG readings available 6. Sensor change every 10 days and rotate site 7. Call Dexcom customer service if sensor comes off before 10 days  Safety and Troubleshooting 1. Do a fingerstick blood glucose test if the sensor readings do not match how    you feel 2. Remove sensor prior to magnetic resonance imaging (MRI), computed tomography (CT) scan, or high-frequency electrical heat (diathermy) treatment. 3. Do not allow sun screen or insect repellant to come into contact with Dexcom G6. These skin care products may lead for the plastic used in the Dexcom G6 to crack. 4. Dexcom G6 may be worn through a Environmental education officer. It may not be exposed to an advanced Imaging Technology (AIT) body scanner (also called a millimeter wave scanner) or the baggage x-ray machine. Instead, ask for hand-wanding or full-body pat-down and visual inspection.  5. Doses of acetaminophen (Tylenol) >1 gram every 6 hours may cause false high  readings. 6. Hydroxyurea (Hydrea, Droxia) may interfere with accuracy of blood glucose readings from Dexcom G6. 7. Store  sensor kit between 36 and 86 degrees Farenheit. Can be refrigerated within this temperature range.  Contact information provided for [facility] customer service and/or trainer.   Patient taking >500 mg Vitamin C: denies  Freestyle Libre 2.0 patient education Person(s)instructed: mom, patient  Instruction: CGM overview and set-up 1. Button, touch screen, and icons 2. Power supply and recharging 3. Home screen 4. Date and time 5. Set BG target range: 70-300 mg/dL 6. Set alarm/alert tone  7. Interstitial vs. capillary blood glucose readings  8. When to verify sensor reading with fingerstick blood glucose  Sensor application 1. Site selection and site prep with alcohol pad/Skin Tac 2. Sensor prep-sensor pack and sensor applicator 3. Starting the sensor: 1 hour warm up before BG readings available    Will ask for fingersticks the first 12 hours   4. Sensor change every 14 days and rotate site 5. Call [employer] customer service if sensor comes off before 14 days  Safety and Troubleshooting 1. Scan the sensor at least every 8 hours 2. When the "test BG" symbol appears, test fingerstick blood glucose prior to    making treatment decisions 3. Do a fingerstick blood glucose test if the sensor readings do not match how    you feel 4. Remove sensor prior for MRI or CT. Sensor may be damaged by exposure to    airport x-ray screening 5. Vitamin C may cause false high readings and aspirin may cause false low     readings 6. Store sensor kit between 39 and 77 degrees. Can be refrigerated at this temp.  Contact information provided for [employer] customer service and/or trainer.   O:   Labs:   There were no vitals filed for this visit.  Lab Results  Component Value Date   HGBA1C 4.8 07/25/2019   HGBA1C 5.0 02/10/2019    No results found for: CPEPTIDE     Component Value Date/Time   CHOL 123 02/01/2018 0000   TRIG 114 (H) 02/01/2018 0000   HDL 37 (L) 02/01/2018 0000   CHOLHDL 3.3  02/01/2018 0000   LDLCALC 66 02/01/2018 0000    No results found for: MICRALBCREAT  Assessment: Dexcom G6 CGM placed on back of patient's left arm successfully. Synched patient's Dexcom Clarity account to [facility] Clarity account. Discussed difference between glucose reading from blood vs interstitial fluid, how to interpret Dexcom arrows, how to order Dexcom sensor overpatches, and use of Skin Tac/Tac Away to assist with CGM adhesion/removal. High alarms have been turned off. Also discussed FSL 2.0 CGM use. Provided handouts with all of this information as well. She will use FSL  2.0 CGM once her sensor sample expires (provided 2 FSL 2.0 samples). Will contact family once I receive update regarding insurance appeal response.   Plan: Monitoring:  Continue wearing Dexcom G6 CGM Follow Up: prn  Written patient instructions provided.    This appointment required 60 minutes of patient care (this includes precharting, chart review, review of results, face-to-face care, etc.).  Thank you for involving clinical pharmacist/diabetes educator to assist in providing this patient's care.  Drexel Iha, PharmD, BCACP, CDCES, CPP  Tillman Sers, MD, CDE

## 2020-08-26 NOTE — Progress Notes (Signed)
Subjective:  Subjective  Patient Name: Cressie Betzler Date of Birth: 06-25-2000  MRN: [id number]  Rory Montel  presents for her clinic visit today for follow up evaluation and management of her acquired hypothyroidism due to Hashimoto's thyroiditis, goiter, chronic fatigue, dyspepsia, GERD, and many other health problems.   HISTORY OF PRESENT ILLNESS:   Addalynne is a 20 y.o. Caucasian young woman.   Nasiya was accompanied by her mother.  1. Levonia's initial pediatric endocrine clinic visit here at [facility] occurred on 01/29/18. The following information is a composite of the history gained at her initial visit, information gained after 7 hours of reviewing her records in [place], and information gleaned on 02/05/18:   A. Tylene has had a very complicated medical and surgical history. She has been hospitalized many times during her life. Her problem list includes the following:    1). Chronic GI problems, with frequent constipation, frequent diarrhea, BMs occurring immediately after eating, nausea, abdominal pains, and feeding difficulties requiring the use of both oral and nocturnal G-tube feedings: Previous EGD and colonoscopy have been unremarkable. TTG IgA was negative on 12/29/17.    2). Chronic allergies, including seasonal allergies, allergies to other environmental agents, latex, Propofol, fish and shellfish according to rashes but not to testing, and Alpha-Gal allergy with a positive result, but not specifically beef, pork, or lamb;   3). Chronic fatigue   4). Iron deficiency anemia   5). Hypothyroidism with elevated TPO and thyroglobulin antibodies, c/w Hashimoto's thyroiditis;       6). Muscle weakness and myalgias   7). 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 Family was offered treatment with Florinef, but declined because their pharmacist advised against starting Florinef due to possible adverse effects.     8).  Intermittent dehydration requiring administration of iv fluids via her port-a-cath every 6 weeks, but more recently every 2 weeks   9). Chronic headaches, to include migraines             10). 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 Chronic swelling of her hands and feet   13). Chronic pain of her back, joints, legs, and feet   14). Skin rashes, hives, eczema, and flushing   15). Irregular menses and menorrhagia   16). Nocturnal hypoglycemia if she does not have nocturnal pump feedings.    17). Chronic illness with low grade fevers, sometimes up to 101, but mostly 99-100 degrees   18). 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 However, her testing did not support that hypothesis. Mom has the same genetic variant, but not all of the same problems.   B. Perinatal history: Gestational Age: [redacted]w[redacted]d; 5 lb 7 oz (2.466 kg); Healthy newborn, except noted to have a right club foot deformity.    C. Infancy: She had to be hospitalized several times for unexplained dehydration. She had club foot surgery at about 66 weeks of age.   D. Childhood:   1). She was sick a lot.    2). She had PT for her club foot.    3). A Chiari malformation was discovered at age 40. She had decompression surgery  in 2007.    4). In 2008 she was discovered at [facility] to have a tethered cord. She had a release procedure in 2008.   5). In 2010 she had a muscle BX that showed mild variation in fiber size, more notable in Type I fibers with rare atrophic Type I fibers. EM showed that the normal sarcomeric architecture was preserved. Mitochondria were morphologically unremarkable. Dr. 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    6). In 2010 she continued to have severe problems with lack of appetite, nausea, and feeding difficulties. In late 2010 she had a G-tube placed.    7). Genetic testing was performed at [facility] on 09/02/08. Karyotype and chromosomal array were normal. 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 Mitochondrial DNA copy number in muscle tissue was normal, 123% of the mean value of the age and tissue tested controls.   8). On 10/18/12 Nakyah's TFTs were c/w primary hypothyroidism. Levothyroxine treatment was initiated.    9). She had a cholecystectomy in June 2015 for biliary hypokinesia.    10). A port-a-cath was placed in August 2018.    11). In December 2018 she saw a geneticist, Dr. Penni Bombard, in [place] for evaluation of a mitochondrial dysfunction. 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 Targeted GeneDX testing was performed in June-July 2019 and reportedly showed allergies in both Colburn and her brother. Testing in Treanna's brother also showed genetic tendencies for seizures and developmental delays.     E. Chief complaint:   1). Hypothyroidism was diagnosed on 18 October 2012 after several years of variable thyroid hormone test results. Her TSH was high 10.35 (ref 0.5-4.5) and free T4 was low at 0.67 (ref 0.80-2.0). TPO antibody and thyroglobulin antibody were both reportedly positive. She started thyroid hormone at about that time. She takes generic levothyroxine, 50 mcg/day. That dose has not been changed since it was started. She does not think that she has ever had thyroid swelling or pain. Mom said that Loredana's TSH and free T4 values have been normal. However, her TSH was elevated again in February 2019.   2). Hypoglycemia: At about age 58 she had low BGs almost all the time. If she did not get G-tube feedings at  night, the BGs in the mornings could be in the 40s-50s. She had never been evaluated for adrenal insufficiency. If her BGs are low during the day she can get dizzy.   3). Dizziness: She has been dizzy for many years. The dizziness was originally ascribed to her Arnold-Chiari malformation, but did not improve after surgery. About 30% of her dizziness was associated with low BGs. Some of her dizziness was a spinning dizziness.  Some of her dizziness appears to be orthostatic dizziness. This latter dizziness occurs almost every morning when she sits up from a supine or prone position. The dizziness can also occur if she stands up too fast from a seated position. Whenever she gets dizzy for any reason, she often gets black spots in front of her eyes and feels like she will pass out.   4). Abnormal periods and excessive menstrual and intermenstrual bleeding: Menarche occurred at age 74. Periods have never been regular. When she took most OCPs she had persistent vaginal bleeding. Seasonale caused her to have menses every 3 months for about one year, then it became less effective. She continued to have heavy spotting. This problem has  been sometimes attributed to a connective tissue disorder.    5). 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 Her initial [facility] consultation occurred on 03/15/12.      1). The report of her 2009 muscle BX was reviewed. The genetics reports from [facility] done in July 2010 and from [facility] in 2010 were reviewed. Also reviewed was a report from [facility] Salomon Fick [facility] who did not endorse support for a diagnosis of mitochondrial disease.      2). The [facility] geneticist noted that none of Pernella's previous testing supported a diagnosis of a mitochondrial disorder. The geneticist also stated that no specific genetic disorders are known to cause her constellation of symptoms shared by her and her younger brother, including Chiari I  malformation. Genome sequencing testing, to determine if Dawnn and her brother had a unique gene mutation underlying their presentations, was offered to the family. Unfortunately the costs of that testing were too high for the family to agree to the testing     B). Her [facility] record note from 10/15/15 shows that Lacey's previous tests for myopathy and mitochondrial function were all negative. Genetic whole exome testing was negative for a 20 y.o. female with Chiari I malformation, fatigue, muscle weakness, feeding difficulties, tethered cord, and clinical diagnosis of Ehlers-Danlos syndrome. GeneDX testing showed a variant in the mitochondrial genome of uncertain significance that was found in 200% of Yaniah's mitochondria and 100% of her mother's mitochondria. Brother presumably had the same variant. It was noted that since [place] and Braxton inherited the same change as the mother and at the same levels, it was unlikely that this variant was causing Sachiko's and Braxton's symptoms, because the mother did not exhibit similar symptoms. [Addendum 02/05/18: Mom says that she did have some of the same symptoms that her children had, but the geneticist might not have realized that mom did have some of the same symptoms. Mom did not have as many symptoms as Kylin and Braxton had.] Multiple rounds of genetic testing including WES and mitochondrial genome sequencing, were not suggestive of mitochondrial disorder or any other genetic diseases. The family was offered the option of applying for further testing in the [facility]. The family applied for that program, but were not accepted due to the fact that the program wanted patients with different issues.     C). When Barney saw Dr Penni Bombard, a geneticist in [place], he confirmed that she does have a mitochondrial disorder and also has Ehlers-Danlos syndrome.     6). Feeling unwell, fatigue, fevers, headaches, nasal and head congestion, lack of  appetite: These problems vary from one part of the day to another. She has at least one of them every day.     A). Fatigue: She does not feel tired when she awakens, but does begin to feel tired about 2 PM. If she naps for an hour or two, the fatigue improves for several hours, but then recurs around 6 PM. If she does not nap, she remains tired and develops a bitemporal headache about 4-5 PM. She occasionally has a problem with insomnia, but not with early awakening. Sleeping in on weekends does not improve her fatigue.      B). Fevers: When she feels bad she usually has fevers. Most of her fevers are in the 98.8-99.2 range. These "fevers" occur about 5-7 PM.     C). Nasal and head congestion: She has had a stuffed  up nose for 4-5 months. When she has nasal congestion she also sometimes has head congestion. However, she does not have head congestion without having nasal congestion.      D). Headaches: She often has nuchal headaches when her trapezius muscles are sore/stiff. Sometimes these nuchal headache extend to the vertex. She also has bitemporal headaches when she gets tired.   F. Pertinent family history:   1). Stature and puberty: Mom is 5-1. Dad is 5-5. Mom had menarche at age 55. Mom has had irregular menses prior to her first pregnancy. She was diagnosed with endometriosis after her first pregnancy. After treatment for endometriosis, presumably GnRH analogues for 6 months, her menses were normal.     2). Obesity: Maternal grandmother   3). DM: Maternal grandmother has T2DM. A maternal grand uncle had DM, possibly type 1.    4). Thyroid: 76 y.o. sister has had a few abnormal thyroid hormone levels. Paternal grandmother is hypothyroid, without having had thyroid surgery, irradiation. or been on a prolonged low iodine diet.    5). ASCVD: Mother has a heart murmur.    6). Cancers: paternal grandfather dies of lung CA. There was also leukemia in a distant paternal relative.    7). Others: Older  sister has had oligomenorrhea frequently. Mother has migraines. Younger brother has worse mitochondrial dysfunction and seizures. Maternal grandmother has osteoarthritis and fibromyalgia.  Maternal grandmother takes B12 shots for anemia. Maternal grandmother also has hypertension.  One maternal uncle had cardiomyopathy. Maternal grand uncle had his first CVA at age 60. A distant paternal second cousin and a paternal first cousin had lupus.]  G. Lifestyle:   1). Jamecia's diet: She can eat food normally and typically eats 3 small meals per day. She estimates that about 40% of her calories comes from oral intake. She does not eat much at any one time. She is not a "foodie" by nature and is a very picky eater.  She has G-tube feedings every night: 2 cans of Vital Peptide, 1.5 calories per mL, continuous feeding, from 9:30 PM to 7:30 AM via a pump. She does not usually take fluids by the G-tube during the day, but may do so during the Summer months to prevent dehydration.    2). She receives iv fluid infusions by her port-a-cath every other week: D5 1/2 NS, three 1000 ml bags over 12 hours. If she gets sick or gets dehydrated, she gets 3000 ml of D10NS iv over 12 hour. She receives substantial home health nursing support from [facility].     3). Physical activities: She does not walk much for exercise because walking causes her feet to swell. She does not go outside much during the Winter months. She was told by one doctor that she had angioedema.    4). Medications:    A). Morning medications: CoQ10, vitamin E, vitamin C. Prevacid, and allergy medications. Breakfast is usually a fruit smoothy.     B). Bedtime: LT4, Topamax, Lyrica, Seasonale,  2. Clinical course:  A. In May 2020 she had a central venous catheter access device inserted. The catheter is working well.  Crissie Figures saw Dr. Damita Lack on 11/19/18. Her PFTs were essentially normal. The MIP/MEPs were better, but still low. Her sleep study was  normal. He put her on Mestinon three times daily. The Mestinon helped for several hours.   C. She also saw Dr. Artis Flock on 11/19/18. Dr. Artis Flock ordered an EMG to be done at [facility].   DGerarda Gunther  has a long history of developing physical symptoms that she attributes to adverse reactions to medications and then stopping those medications without discussing the issues with the physicians that prescribed the medications. I wonder if some of those reactions were really due to the medications involved. She also has a history of not always following up with specialists that she has seen if what they prescribed she felt was ineffective or might have caused adverse reactions .   E. I tried to start her on fludrocortisone in January 2021, but she stopped it after about 3 weeks because it made her feel bad.   F. On 03/04/19 Dr. Artis Flock had a video visit with Porshea and increased her mestinon to 120 mg, three times daily. Brailee feels that the mestinon is helping "a little bit".   G. On 03/06/19 she saw a GI specialist at [facility]. She was treated with IBGard as needed. She did not feel that the IBGard helped very much and did not contact the specialist for follow up.   H. On 03/24/19 Ciela's G-tube and port-a-cath were replaced.    I. On 05/11/19 she was seen in ortho. A diagnosis of bilateral shoulder subluxations was made, presumably due to Ehlers-Danlos syndrome. She was in PT.   J. On 06/23/19 she had a NCV/EMG study at [facility]. Family does not know the results.   K. She saw ENT in October 2021. 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 The ENT suggested that she might have TMJ.  3. Marasia's last [facility] visit occurred on 04/23/20. I continued her on Synthroid 88 mcg/day. I converted her to rabeprazole, 20 mg, twice daily. The rabeprazole has helped.   A. In the interim she has been fairly healthy, except for a some drops in BG.   B. Her low BGs occur 30-120 minutes after eating. She snacks  frequently during the day. She usually never eats large meals because her stomach feels too full. She says that she is now taking in more calories via her G-tube than orally. She now has less nausea in the middle of the night and none during the day. In the past the nausea often took away her appetite. Sometimes when she ate the nausea became worse.   C. She still has some back pain.  D. She has not had any further excess vaginal bleeding. She does have menses every 3 months with her Seasonale OCPs that she takes daily. She will have a GYN appointment later this month.  E. She feels hot at night. She still tends to be cold during the day. F. She is still often tired, but not as often as before.  G. She thinks her headaches are better, in that they are less severe. She now has headaches about every 2 weeks or less.   H. She still has orthostatic dizziness "a few times a day".    I. Her breathing difficulties have remained about the same. Dr. Damita Lack feels that she may have muscle weakness. He has referred her back to Dr. Artis Flock.   J. She still takes 88 mcg/day of Synthroid. She still has weekly 12-hour iv infusions of D5. She feels better for about a week after the infusions. She has been taking rabeprazole only once a day because she is "terrible about taking night-time medicines".   K.  She continues to have episodic pains in the TMJ area.   L. The eczema around her eyes and forehead is better. She still applies a clobetasol cream  given her by dermatology in [place].   4. Pertinent Review of Systems:  Constitutional: Romey says she feels "pretty good", then rolls her eyes. She is "less tired". She is colder than others all of the time. When she was out in the heat during the Summer, she sometimes sweated. She is also now sweating in her hands, axillae, and groin at other times.  Eyes: Vision is "not great". She saw a neuroophthalmologist on 06/16/19. The possibility of optic neuropathy, similar to  her brother, was raised. She has not yet scheduled her annual follow up. There are no other significant eye complaints. Neck: The patient has had more complaints of anterior neck soreness and difficulty swallowing recently.  Heart: Her heart rate has not felt subjectively to be unusually fast or slow. The patient has few complaints of palpitations, irregular heat beats, chest pain, or chest pressure. Gastrointestinal: As above. She has much less nausea in the middle of the night. She no longer has to take Zofran very often. She no longer has nausea during the day. She has not been able to identify a particular trigger. She also occasionally has some mild reflux. She no longer has epigastric pains. Bowel movents have been more normal. She is not having as much alternating diarrhea and constipation. The patient has no complaints of excessive hunger. Hands: Hands are dry now, but will be more moist later in the day. Strength is "fine".   Legs: Muscle mass and strength "are okay". She occasionally has pains of her anterior thighs and anterior shins. There are no other complaints of numbness, tingling, burning, or pain. She notes edema about every day. Edema is worse in the evenings.   Feet: Feet are always wet and cold. There are no obvious foot problems. She occasionally has numbing of the medial 3-4 toes. There are no other complaints of numbness, tingling, burning, or pain. She often has dorsal edema.  GYN: As above. Her Seasonale contraceptive is causing her to have menses about every three months.   PAST MEDICAL, FAMILY, AND SOCIAL HISTORY  Past Medical History:  Diagnosis Date   Allergy to alpha-gal    elevated IgE 04/10/16   Autonomic dysfunction    Chiari I malformation (HCC)    Complication of anesthesia    slow to wake up   Dyspnea    Eczema    GERD (gastroesophageal reflux disease)    Headache    Heart murmur    Hypoglycemia    Hypothyroid    IBS (irritable bowel syndrome)    Joint  pain    Mitochondrial disease (HCC)    Muscle pain    Pneumonia    several times   PONV (postoperative nausea and vomiting)     Family History  Problem Relation Age of Onset   Asthma Mother    Allergic rhinitis Mother    Heart murmur Mother    Allergic rhinitis Sister    Allergic rhinitis Brother    Asthma Brother    Seizures Brother    Mitochondrial disorder Brother    Allergic rhinitis Maternal Grandmother    Asthma Maternal Grandmother    Diabetes type II Maternal Grandmother    Lung cancer Maternal Grandfather    Angioedema Neg Hx    Eczema Neg Hx    Immunodeficiency Neg Hx    Urticaria Neg Hx      Current Outpatient Medications:    albuterol (ACCUNEB) 0.63 MG/3ML nebulizer solution, Take 3ml by nebulizer 3 times per day as  needed for shortness of breath, Disp: 270 mL, Rfl: 5   ascorbic acid (VITAMIN C) 500 MG tablet, Take 500 mg by mouth daily. , Disp: , Rfl:    azelastine (ASTELIN) 0.1 % nasal spray, Place 2 sprays into both nostrils 2 (two) times daily. Use in each nostril as directed, Disp: 30 mL, Rfl: 5   clobetasol (OLUX) 0.05 % topical foam, Apply topically 2 (two) times daily. Apply to affected areas twice daily as needed taking care to avoid axillae and groin area., Disp: 45 g, Rfl: 3   Coenzyme Q-10 200 MG CAPS, Take 1 capsule by mouth daily., Disp: , Rfl:    Continuous Blood Gluc Receiver (DEXCOM G6 RECEIVER) DEVI, 1 Device by Does not apply route as directed., Disp: 1 each, Rfl: 2   Continuous Blood Gluc Sensor (DEXCOM G6 SENSOR) MISC, Inject 1 applicator into the skin as directed. (change sensor every 10 days), Disp: 3 each, Rfl: 11   Continuous Blood Gluc Transmit (DEXCOM G6 TRANSMITTER) MISC, Inject 1 Device into the skin as directed. (re-use up to 8x with each new sensor), Disp: 1 each, Rfl: 3   EPINEPHrine 0.3 mg/0.3 mL IJ SOAJ injection, Inject 0.3 mg into the muscle once. As needed, Disp: , Rfl:    leucovorin (WELLCOVORIN) 25 MG tablet, TAKE 1 TABLET BY  MOUTH 2 TIMES A DAY, Disp: 180 tablet, Rfl: 0   levOCARNitine (CARNITOR) 1 GM/10ML solution, TAKE 2 TEASPOONFUL ( ) BY MOUTH TWICEDAILY (Patient taking differently: Place 1,000 mg into feeding tube daily.), Disp: 118 mL, Rfl: 12   levonorgestrel-ethinyl estradiol (SEASONALE) 0.15-0.03 MG tablet, Take 1 tablet by mouth daily., Disp: 90 tablet, Rfl: 2   lidocaine (XYLOCAINE) 5 % ointment, Apply 1 application topically as needed., Disp: 35.44 g, Rfl: 3   metaxalone (SKELAXIN) 800 MG tablet, TAKE 1/2 TABLET (400MG  TOTAL DOSE) BY MOUTH EVERY DAY, Disp: 15 tablet, Rfl: 3   Nutritional Supplement LIQD, Place 720 mLs into feeding tube as directed. Dispense Ensure Clear for nightly tube feedings of 720 ml at 85 ml/hr, Disp: 216000 mL, Rfl: 12   ondansetron (ZOFRAN-ODT) 4 MG disintegrating tablet, Take 1 tablet under the tongue every 6-8 hours as needed for nausea., Disp: 30 tablet, Rfl: 5   pregabalin (LYRICA) 25 MG capsule, Take 1 capsule (25 mg total) by mouth 2 (two) times daily., Disp: 60 capsule, Rfl: 5   pregabalin (LYRICA) 75 MG capsule, TAKE 2 CAPSULES BY MOUTH EACH NIGHT AT BEDTIME, Disp: 60 capsule, Rfl: 5   PRESCRIPTION MEDICATION, Apply 1 application topically as needed (port). Elma- cream , Disp: , Rfl:    promethazine (PHENERGAN) 25 MG tablet, Take 1 tablet at onset of migraine. May repeat every 6 hours PRN, Disp: 30 tablet, Rfl: 0   pyridostigmine (MESTINON) 60 MG tablet, Take 1 tablet (60 mg total) by mouth in the morning, at noon, in the evening, and at bedtime., Disp: 120 tablet, Rfl: 5   RABEprazole (ACIPHEX) 20 MG tablet, TAKE ONE TABLET TWICE DAILY., Disp: 60 tablet, Rfl: 6   rizatriptan (MAXALT) 10 MG tablet, Take 1 tablet (10 mg total) by mouth as needed for migraine (May repeat in 2 hours if needed). May repeat in 2 hours if needed, Disp: 12 tablet, Rfl: 3   SYNTHROID 88 MCG tablet, TAKE 1 TABLET BY MOUTH EVERY MORNING ON AN EMPTY STOMACH, Disp: 90 tablet, Rfl: 1   topiramate  (TOPAMAX) 25 MG tablet, TAKE 1 TABLET BY MOUTH EACH NIGHT AT BEDTIME, Disp: 30 tablet, Rfl:  3   traZODone (DESYREL) 50 MG tablet, Take half a tablet 30 minutes before bedtime. If needed, may increase to 1 tablet before bedtime., Disp: 30 tablet, Rfl: 1   TROKENDI XR 50 MG CP24, Take 1 capsule per day. This is in addition to the Topiramate 25mg  tablet at bedtime, Disp: 30 capsule, Rfl: 5   albuterol (PROVENTIL HFA) 108 (90 Base) MCG/ACT inhaler, Inhale 2 puffs into the lungs every 4 (four) hours as needed for wheezing or shortness of breath. (Patient not taking: Reported on 11/17/2019), Disp: 6.7 g, Rfl: 2  Allergies as of 08/27/2020 - Review Complete 08/27/2020  Allergen Reaction Noted   Other Anaphylaxis 03/29/2013   Latex Rash 04/08/2012   Lactated ringers  05/28/2018   Propofol Other (See Comments) 05/17/2012   Tape Itching and Other (See Comments) 05/17/2012     reports that she has never smoked. She has never used smokeless tobacco. She reports that she does not drink alcohol and does not use drugs. Pediatric History  Patient Parents   Fehringer,Leesa (Mother)   Hilbert CorriganPerdue, Joe (Father)   Other Topics Concern   Not on file  Social History Narrative   Magdala graduated from [employer]. Attends [employer]. She is majoring in History This is her senior year!    She enjoys reading, playing with her cat and her dog, and hanging out with her sister.     1. School and Family: She attends [employer] with mostly on-campus classes. She lives at home.  2. Activities: Sedentary 3. Primary Care Provider: Verdene LennertBasaraba, Iulia, MD She is due to see a new internist soon.  4. Peds Neurology: Dr. Lorenz CoasterStephanie Wolfe, MD 5. Allergy: Dr. Malachi BondsJoel Gallagher, [facility] [place] 6. GI: No one at present 7. Peds Endocrinology: [facility] 8. Peds Pulmonary: Dr. Damita LackStoudemire 9. Genetics: [facility] in the past 10. GYN: None  REVIEW OF SYSTEMS: There are no other significant  problems involving Bernita's other body systems.    Objective:  Objective  Vital Signs:  BP 98/64 (BP Location: Right Arm, Patient Position: Sitting, Cuff Size: Normal)   Pulse 72   Wt 121 lb (54.9 kg)   BMI 20.68 kg/m    Ht Readings from Last 3 Encounters:  07/26/20 5' 4.13" (1.629 m)  06/22/20 5' 3.98" (1.625 m)  05/31/20 5\' 3"  (1.6 m)   Wt Readings from Last 3 Encounters:  08/27/20 121 lb (54.9 kg)  07/26/20 118 lb 9.7 oz (53.8 kg)  06/22/20 115 lb (52.2 kg)   HC Readings from Last 3 Encounters:  06/22/20 20.87" (53 cm)   Body surface area is 1.58 meters squared. Facility age limit for growth percentiles is 20 years. Facility age limit for growth percentiles is 20 years.   PHYSICAL EXAM:  Constitutional: Gerarda Guntherllie looks somewhat tired, but better. She is awake and alert, but fairly passive. She answers questions with 1-3 word answers, but does not volunteer any information. Her affect is rather flat. Her insight is good. She has gained 11 pounds in 4 months.   Eyes: There is no arcus or proptosis.  Mouth: The oropharynx appears normal. The tongue appears normal. There is normal oral moisture. There is no obvious gingivitis. Thre is no oral hyperpigmentation.  Neck: There are no bruits present. The thyroid gland appears mildly enlarged.  The thyroid gland is slightly smaller at about 21+ grams in size. Today the right lobe has shrunk back to top-normal size and her left lobe is larger. The consistency  of the left lobe of the gland is full, but the right lobe is normal.  She has some discomfort of her left lobe today.  Lungs: The lungs are clear. Air movement is good. Heart: The heart rhythm and rate appear normal. Heart sounds S1 and S2 are normal. I do not appreciate any pathologic heart murmurs. Abdomen: The abdomen is normal in size. Bowel sounds are normal. The G-tube site is fairly clean, but she did have some serosanguinous crusting below the site. I asked her to clean the area  with alcohol. There is no obviously palpable hepatomegaly, splenomegaly, or other masses. There was no tenderness to palpation.  Arms: Muscle mass appears appropriate for age.  Hands: There is no obvious tremor. Phalangeal and metacarpophalangeal joints appear normal. Palms are normal. Nails are moderately pallid.  Legs: Muscle mass appears appropriate for age. There is no edema.  Neurologic: Muscle strength is 4-5/5 in the UEs and 4-5/5 in th LEs. Muscle tone appears normal. Sensation to touch is normal in the legs.   LAB DATA:   Labs 08/27/20: CBG 105  Labs 08/15/20: TSH 1.14, free T4 1.3, free T3 2.8; CMP normal, except CO2 19 (ref 20-32); CBC normal, except RC 5.12 (ref 3.80-5.10); iron 102 (ref 40-190); prolactin 7.1 (ref 3-30)  Labs 04/20/20: TSH 1.14, free T4 1.4, free T3 2.9; CBC normal, except platelets 428 (ref 140-400);   Labs 11/17/19: TSH 1.21, free T4 1.1, free T3 2.9 (ref 3.0-4.7); CBC normal; iron 69; LH <0.2, FSH 0.7,   Labs 07/25/19: HbA1c 4.8%,  TSH 1.67, free T4 1.3, free T3 3.0; CBC normal, except RBC 5.22 (ref 3.8-5.1) and Hct 45.5% (ref 35-45%); iron 79 (ref 40-190)  Labs 03/24/19: Serum sodium 141, potassium 5.2, chloride 19, CO2 18, glucose 81  Labs 02/10/19: HbA1c 5.0%; TSH 0.73, free T4 1.3, free T3 3.0; CMP normal  Labs 11/12/18: TSH 1.12, free T4 1.38, T3 156 (ref 71-180); prolactin 11.3 (ref 4.8-23.3)  Labs 09/16/18: Androstenedione 55 (ref 51-230), DHEAS 67 (ref 51-321)  Labs 08/30/18 at 8:43 AM: ACTH 13 (ref 6-50), cortisol 26.2 (ref 4-22), aldosterone 4 (ref < 28), plasma renin activity 2.4 (ref 0.25-5.82),   Labs 08/02/18: TSH 2.12, free T4 1.3, free T3 3.0; CMP normal; aldosterone <1 (reg 3-28), plasma renin activity (PRA) 0.92 (ref 0.25-5.82); prolactin 9.9 (ref 3-30)  Labs 02/01/18 at 8 AM: TSH 4.16 (ref 0.50-4.30, but many endocrinologists consider 3.4 to be the true physiologic upper limit of normal), free T4 1.4 (ref 0.8-1.4), free T3 2.7 (ref 3.0-4.7), TPO  antibody 241 (ref <9), thyroglobulin antibody 1 (ref < or = 1); CMP normal except CO2 19; cholesterol 123, triglycerides 114, HDL 37, LDL 66; Prolactin 16.1 (ref 3.3-20); IGFBP-3 7.8 (ref 3.2-7.9); IGF-1 249 (ref 108-548, but very appropriate for a young woman whose height has plateaued); iron 177 (ref 27-164); CBC normal; androstenedione 46 (ref 51-230), DHEAS 74 (ref 51-321); ACTH 14, cortisol 28.9  Labs 11/25/17: TSH 1.58; CBC normal except 736 eosinophils (ref 15-500)  Labs 06/24/17: TSH 2.418, free T4 0.98  Labs 04/21/17: TSH 1.30  Labs 03/10/17: TSH 7.87, free T4 1.2; CBC normal; prolactin 7.1  Labs 06/24/16: TSH 1.960, free T4 1.12  Labs 04/10/16: TSH 2.28, TPO antibody 165 (ref <9), thyroglobulin antibody 2 (ref <2)  Labs 05/29/15: TSH 1.640, free T4 1.09  Labs 11/28/14: TSH 1.87, free T4 0.80  Labs 05/31/2014: TSH 1.680, free T4 1.23  Labs 04/19/14: TSH 1.19  Labs 09/30/13: TSH 2.53, free T4 1.14  Labs 02/04/13: TSH 0.88, T4 11.3  Labs 10/18/12: TSH 10.35 (ref 0.5-4.5), free T4 0.67 (ref 0.8-2.0)  IMAGING  Thyroid ultrasound 12/01/19: Small, heterogenous thyroid. 3 mm hypoechoic nodule left inferior lobe. No indication for biopsy or dedicated imaging follow up.     Assessment and Plan:  Assessment  ASSESSMENT:  Crecencio Mc is a 20 y.o. Caucasian young woman with multiple medical problems as noted below. At this visit, she is having active Hashimoto's thyroiditis, but is clinically and chemically euthyroid. She is having less fatigue. Her extreme nausea and stomach discomfort have improved while taking rabeprazole once daily. Her unintentional weight loss has resolved and she is gaining tissue weight. Although her BP is still relatively low, she is having less dizziness and more sweating, possibly associated with improvement in her dysautonomia. Her hypoglycemic symptoms persist. Overall she appears to be doing better.   1-3. Hypothyroid, acquired/goiter/thyroiditis:   A. The  occurrence of autoimmune thyroid disease with positive anti-thyroid antibodies is very common.   B. At her visit in January 2020 her TSH was >4.0 and her TPO antibody level was higher. We increased her Synthroid dose to 88 mcg/day.   C. From January to August 2020 her goiter remained the same in overall size, but the lobes had shifted in size. The process of waxing and waning of thyroid gland size was c/w evolving Hashimoto's thyroiditis.   D. At her August visit her TFTs were at about the 30% of the physiologic range. I would usually have  increased the Synthroid dose to 100 mcg/day, but decided to await the results of her Hypothalamic-Pituitary-Adrenal (HPA) axis testing. Fortunately, those results were normal.   E. Since her August 2020 visit she has had intermittent flare ups of thyroiditis.   E. Her TFTs drawn on 11/12/18 were mid-euthyroid on her current Synthroid dose of 88 mcg/day. Her TFTs on 02/10/19 were at about the 75% of the physiologic range. Her TFTs in July 2021 were mid-euthyroid. Her TFTs in November were mid-euthyroid according to her TSH and free T4, but slightly low according to her free T3. These changes were c/w a flare up of thyroiditis, which she was having. She looked mildly hypothyroid clinically at that time.   F. Her thyroid US in November 2021 was read as normal by the radiologist. By our standards the left and right lobes were mildly enlarged.   G. .Her goiter is still asymmetric today, but a bit smaller. The left lobe is again moderately uncomfortable to palpation.. Her TFTs in April 2022 and August 2022 were mid-normal. We will continue the 88 mcg dose for now, but repeat her TFTs prior to her next visit.   H. The episodic tenderness of the gland and the shifting of size in the lobes from visit to visit is c/w evolving Hashimoto's thyroiditis.  4. Dizziness/hypotension:   A. She still occasionally has elements of spinning dizziness c/w vertigo, orthostatic dizziness c/w  POTS /dysautonomia and intermittent dehydration. Fortunately, these symptoms have been less frequent and less severe.   B. Chewing gum and taking meclizine can help the vertigo.  C. Remaining well hydrated can help with the ortho stasis.    D. Since both her aldosterone and renin were "lowish" in October. It was reasonable to conduct a therapeutic trial of fludrocortisone. 0.1 mg/day. Unfortunately, she felt that the medication did not help her, so stopped it.   E. She feels that she has been having less dizziness recently.  5-7. Weakness/fatigue/cold sensation:    A.  The cause(s) of these problems is/are unclear. 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   B. Her ACTH and adrenal hormone tests were normal in January 2020 and again in August and September 2020. Her aldosterone was very low in July 2020 but normal in August.   C. As noted above, she was euthyroid in July and November 2021.   D. She could have fibromyalgia, chronic fatigue, or some other "autoimmune" problem. 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   E. Giving the infusions of D5W every week has made Mata feel better.   F. Hot showers cause her to feel weak and dizzy. This problem is c/w orthostatic hypotension and with dysautonomia.  G. Interestingly, her fatigue is better and her evening mildly elevated temperature sensations are bless frequent.   8. Hypoglycemia:   A. The cause(s) of this problem is/are also unclear. Her hypothalamic-pituitary-hepatic axis for [facility] and her hypothalamic-pituitary-adrenal axis seemed to be normal previously. She could have celiac disease, but her tests in December 2019 were negative. She could have a problem with glycogen storage, glycogenolysis, and/or gluconeogenesis. The fact that she often does not eat much, and the fact that she appears to have a very active gastro-colic reflex may interact to impair glucose intake, glucose absorption, and glycogen  synthesis.  B. She has still occasionally had some hypoglycemic symptoms, perhaps more frequently.   Carlynn Spry had a consultation visit with Dr. Ladona Ridgel, our PharmD clinical pharmacist and diabetes educator. . Dr. Ladona Ridgel has ordered a Dexcom for Eleanore.   9. Abnormal menses:   A. The cause(s) of this problem is/are also unclear. Mother gives a good history for her own endometriosis that resolved after treatment with GnRH agonists.   BGerarda Gunther has recently not had any excessive bleeding.   C. Her menses are now c/w her [place] OCPs.  10. Alternating constipation and diarrhea: She has had a very active gastro-colic reflex. Fortunately, her bowel movements are more regular now. she was seen by GI in February 2021, but has not returned to clinic. She is doing well now.  11. Hyperprolactinemia:   A. It is highly likely that her elevated prolactin In January 2020 was due to her hypothyroidism.   B. Her prolactin level in July 2020 was well within normal, paralleling her improvement in thyroid hormone status. Her prolactin level in October 2020 was also quite normal.   Her prolactin level in August 2022 was at about the 25% of the reference range. 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 She may have dysautonomia.  13. Dehydration: She is consciously trying to drink 32 ounces of fluid per day. This problem could be due to an inadequate renin-angiotensin-aldosterone system, or to chronic diarrhea, or to other causes.   14. Bradycardia:   A. She is not on a beta-blocker or combined alpha-beta blocker. The fact that mom also has bradycardia suggests a dysautonomia. Hypothyroidism may also have been a causal factor in the past.  B. Recently, most of her HRs have been in the 60s-70s. 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 The aldosterone level and the renin level in August were normal. Her renal function and her adrenal functions were  normal in August and September. As noted above, we tried to  conduct a therapeutic trial of fludrocortisone.  16. Breathing difficulty:  Dr. Damita Lack was following this problem, but referred her back to Dr. Artis Flock.  17. Pallid nail beds. Her CBC in July and November 2021 and in April 2022 and August 2022 were essentially normal. Her iron levels in July and November 2021 and in August 2022 were normal. She still has pallor today. 18. Nausea/heartburn: She is doing better with rabeprazole once daily. I asked her to take the rabeprazole twice daily.   19. Weight loss, unintentional:  A. Nausea sometimes adversely affects her appetite. If she eats too much, she develops stomach pains.  These symptoms are c/w hyperacidity.  B. She has been gaining weight since starting rabeprazole.   PLAN:  1. Diagnostic: I reviewed lab results. I ordered TFTs, CBC, iron to be done 1-2 weeks prior to her next visit.   2. Therapeutic: Continue Synthroid 88 mcg/day for now.  Take rabeprazole, 20 mg, twice daily. 3. Patient education: We discussed all of the above at great length. We again discussed the [place] method to maintain BP when she arises from a supine or sitting position.   4. Follow-up: 4 months.     David Stall, MD, CDE Pediatric and Adult Endocrinology

## 2020-08-27 ENCOUNTER — Other Ambulatory Visit: Payer: Self-pay

## 2020-08-27 ENCOUNTER — Encounter (INDEPENDENT_AMBULATORY_CARE_PROVIDER_SITE_OTHER): Payer: Self-pay | Admitting: "Endocrinology

## 2020-08-27 ENCOUNTER — Encounter (INDEPENDENT_AMBULATORY_CARE_PROVIDER_SITE_OTHER): Payer: Self-pay | Admitting: Pharmacist

## 2020-08-27 ENCOUNTER — Encounter (INDEPENDENT_AMBULATORY_CARE_PROVIDER_SITE_OTHER): Payer: Self-pay

## 2020-08-27 ENCOUNTER — Ambulatory Visit (INDEPENDENT_AMBULATORY_CARE_PROVIDER_SITE_OTHER): Payer: Medicaid Other | Admitting: Pharmacist

## 2020-08-27 ENCOUNTER — Telehealth (INDEPENDENT_AMBULATORY_CARE_PROVIDER_SITE_OTHER): Payer: Self-pay | Admitting: Pharmacist

## 2020-08-27 ENCOUNTER — Ambulatory Visit (INDEPENDENT_AMBULATORY_CARE_PROVIDER_SITE_OTHER): Payer: Medicaid Other | Admitting: "Endocrinology

## 2020-08-27 VITALS — BP 98/64 | HR 72 | Wt 121.0 lb

## 2020-08-27 VITALS — Wt 118.0 lb

## 2020-08-27 DIAGNOSIS — R42 Dizziness and giddiness: Secondary | ICD-10-CM

## 2020-08-27 DIAGNOSIS — R110 Nausea: Secondary | ICD-10-CM | POA: Diagnosis not present

## 2020-08-27 DIAGNOSIS — E049 Nontoxic goiter, unspecified: Secondary | ICD-10-CM | POA: Diagnosis not present

## 2020-08-27 DIAGNOSIS — E063 Autoimmune thyroiditis: Secondary | ICD-10-CM

## 2020-08-27 DIAGNOSIS — E221 Hyperprolactinemia: Secondary | ICD-10-CM

## 2020-08-27 DIAGNOSIS — R231 Pallor: Secondary | ICD-10-CM

## 2020-08-27 DIAGNOSIS — R001 Bradycardia, unspecified: Secondary | ICD-10-CM

## 2020-08-27 DIAGNOSIS — E162 Hypoglycemia, unspecified: Secondary | ICD-10-CM

## 2020-08-27 DIAGNOSIS — R634 Abnormal weight loss: Secondary | ICD-10-CM

## 2020-08-27 DIAGNOSIS — E2740 Unspecified adrenocortical insufficiency: Secondary | ICD-10-CM

## 2020-08-27 DIAGNOSIS — L74513 Primary focal hyperhidrosis, soles: Secondary | ICD-10-CM

## 2020-08-27 DIAGNOSIS — K219 Gastro-esophageal reflux disease without esophagitis: Secondary | ICD-10-CM

## 2020-08-27 DIAGNOSIS — E860 Dehydration: Secondary | ICD-10-CM

## 2020-08-27 DIAGNOSIS — L74512 Primary focal hyperhidrosis, palms: Secondary | ICD-10-CM

## 2020-08-27 DIAGNOSIS — R5383 Other fatigue: Secondary | ICD-10-CM

## 2020-08-27 DIAGNOSIS — N926 Irregular menstruation, unspecified: Secondary | ICD-10-CM

## 2020-08-27 LAB — POCT GLUCOSE (DEVICE FOR HOME USE): POC Glucose: 105 mg/dl — AB (ref 70–99)

## 2020-08-27 MED ORDER — BAQSIMI TWO PACK 3 MG/DOSE NA POWD
1.0000 | NASAL | 3 refills | Status: AC
Start: 2020-08-27 — End: ?

## 2020-08-27 NOTE — Telephone Encounter (Signed)
Dexcom G6 CGM prior authorizations were submitted on8/15/22 to [email or web address] and [employer] [employer]  Dexcom G6 sensors (ID  [id number])   2. Dexcom G6 transmitters (ID#  [id number])   3. Dexcom G6 receiver (ID #  [id number])     [employer] Dexcom G6 sensors (Confirmation #:[id number] W)   Dexcom G6 transmitters (Confirmation #:[id number] W)   Dexcom G6 receiver (Confirmation #:[id number] W)    Prior authorizations will likely be denied and an appeal with have to be pursued.   Thank you for involving clinical pharmacist/diabetes educator to assist in providing this patient's care.   Zachery Conch, PharmD, BCACP, CDCES, CPP

## 2020-08-27 NOTE — Patient Instructions (Signed)
It was a pleasure seeing you in clinic today!  Please call the pediatric endocrinology clinic at  [phone] if you have any questions.   Please remember... 1. Sensor will last 10 days 2. Transmitter will last 90 days and must be reused 3. Sensor should be applied to area away from waistband, scarring, tattoos, irritation, and bones. 4. Transmitter must be within 20 feet of receiver/cell phone. 5. If using Dexcom G6 app on cell phone, please remember to keep app open (do not close out of app). 6. Do a fingerstick blood glucose test if the sensor readings do not match how    you feel 7. Remove sensor prior to magnetic resonance imaging (MRI), computed tomography (CT) scan, or high-frequency electrical heat (diathermy) treatment. 8. Do not allow sun screen or insect repellant to come into contact with Dexcom G6. These skin care products may lead for the plastic used in the Dexcom G6 to crack. 9. Dexcom G6 may be worn through a walk-through metal detector. It may not be exposed to an advanced Imaging Technology (AIT) body scanner (also called a millimeter wave scanner) or the baggage x-ray machine. Instead, ask for hand-wanding or full-body pat-down and visual inspection.  10. Doses of acetaminophen (Tylenol) >1 gram every 6 hours may cause false high readings. 11. Hydroxyurea (Hydrea, Droxia) may interfere with accuracy of blood glucose readings from Dexcom G6. 12. Store sensor kit between 36 and 86 degrees Farenheit. Can be refrigerated within this temperature range.   Ordering Overlay Patches 1. Receiver: Go to the following website every 30 days to order new overlay patches:  [email or web address] 2. Cellphone (Dexcom G6 app): main screen --> settings  --> scroll down to contact --> request sensor overpatches   Problems with Dexcom sticking? 1. Order Skin Tac from amazon. Alcohol swab area you plan to administer Dexcom then let dry. Once dry, apply Skin Tac  in a circular motion (with a spot in the middle for sensor without skin tac) and let dry. Once dry you can apply Dexcom!   Problems taking off Dexcom? 1. Remember to try to shower/bathe before removing Dexcom 2. Order Tac Away to help remove any extra adhesive left on your skin once you remove Dexcom   Dexcom Customer Service Information Customer Sales Support (dexcom orders and general customer questions) Phone number: [phone] Monday - Friday  6 AM - 5 PM PST Saturday 8 AM - 4 PM PST  *Contact if you do not receive overlay patches   2. Global Technical Support (product troubleshooting or replacement inquiries) Phone number: [phone] Available 24 hours a day; 7 days a week  *Contact if you have a "bad" sensor. Remember to tell them you are wearing Dexcom on your stomach!   3. Dexcom Care (provides dexcom CGM training, software downloads, and tutorials) Phone number: [phone] Monday - Friday 6 AM - 5 PM PST Saturday 7 AM - 1:30 PM PST (All hours subject to change)   4. Website: [email or web address]     

## 2020-08-27 NOTE — Progress Notes (Signed)
error 

## 2020-08-27 NOTE — Progress Notes (Signed)
[facility] [address], [place], [place] [address] Phone: [phone] Fax: [phone]                                          Diabetes Medical Management Plan                                               School Year 2022 - 2023 *This diabetes plan serves as a healthcare provider order, transcribe onto school form.   The nurse will teach school staff procedures as needed for diabetic care in the school.Laura Parrish   DOB: May 27, 2000   School: [employer]   Parent/Guardian: Laura Parrish   phone #: [phone]   Parent/Guardian:      phone #: [phone]  Diagnosis: Hypoglycemia (secondary to unknown cause at the time) ______________________________________________________________________  Blood Glucose Monitoring   Target range for blood glucose is: 80-180 mg/dL  Times to check blood glucose level: As needed for signs/symptoms  Student has a CGM (Continuous Glucose Monitor): Yes-Dexcom and Freestyle Libre 2.0 Student may use blood sugar reading from continuous glucose monitor to determine insulin dose.   CGM Alarms. If CGM alarm goes off and student is unsure of how to respond to alarm, student should be escorted to school nurse/school diabetes team member. If CGM is not working or if student is not wearing it, check blood sugar via fingerstick. If CGM is dislodged, do NOT throw it away, and return it to parent/guardian. CGM site may be reinforced with medical tape. If glucose is low on CGM 15 minutes after hypoglycemia treatment, check glucose with fingerstick and glucometer.  It appears most diabetes technology has not been studied with use of Evolv Express body scanners. These Evolv Express body scanners seem to be most similar to body scanners at the airport.  Most diabetes technology recommends against wearing a continuous glucose monitor or insulin pump in a body scanner or x-ray machine, therefore, [facility]  pediatric specialist endocrinology providers do not recommend wearing a continuous glucose monitor or insulin pump through an Evolv Express body scanner. Hand-wanding, pat-downs, visual inspection, and walk-through metal detectors are OK to use.   Student's Self Care for Glucose Monitoring: Independent Self treats mild hypoglycemia: Yes  It is preferable to treat hypoglycemia in the classroom so student does not miss instructional time.  If the student is not in the classroom (ie at recess or specials, etc) and does not have fast sugar with them, then they should be escorted to the school nurse/school diabetes team member. If the student has a CGM and uses a cell phone as the reader device, the cell phone should be with them at all times.    Hypoglycemia (Low Blood Sugar)   Shaky                           Dizzy Sweaty                         Weakness/Fatigue Pale  Headache Fast Heart Beat            Blurry vision Hungry                         Slurred Speech Irritable/Anxious           Seizure  Complaining of feeling low or CGM alarms low  Check glucose if signs/symptoms above Stay with child at all times Give 15 grams of carbohydrate (fast sugar) if blood sugar is less than 80 mg/dL, and child is conscious, cooperative, and able to swallow.  3-4 glucose tabs Half cup (4 oz) of juice or regular soda Check blood sugar in 15 minutes. If blood sugar does not improve, give fast sugar again If still no improvement after 2 fast sugars, call provider and parent/guardian. Call 911, parent/guardian and/or child's health care provider if Child's symptoms do not go away Child loses consciousness Unable to reach parent/guardian and symptoms worsen  If child is UNCONSCIOUS, experiencing a seizure or unable to swallow Place student on side Give Glucagon: (Baqsimi/Gvoke/Glucagon) CALL 911, parent/guardian, and/or child's health care provider  *Pump- Review pump  therapy guidelines  Do not allow student to walk anywhere alone when blood sugar is low or suspected to be low.  Follow this protocol even if immediately prior to a meal.   Physical Activity, Exercise and Sports  A quick acting source of carbohydrate such as glucose tabs or juice must be available at the site of physical education activities or sports. Laura Parrish is encouraged to participate in all exercise, sports and activities.  Do not withhold exercise for high blood glucose.   Laura Parrish may participate in sports, exercise if blood glucose is above 100.  For blood glucose below 100 before exercise, give 20 grams carbohydrate snack without insulin.   Testing  ALL STUDENTS SHOULD HAVE A 504 PLAN or IHP (See 504/IHP for additional instructions).  The student may need to step out of the testing environment to take care of personal health needs (example:  treating low blood sugar or taking insulin to correct high blood sugar).   The student should be allowed to return to complete the remaining test pages, without a time penalty.   The student must have access to glucose tablets/fast acting carbohydrates/juice at all times. The student will need to be within 20 feet of their CGM reader/phone, and insulin pump reader/phone.   SPECIAL INSTRUCTIONS: N/A  I give permission to the school nurse, trained diabetes personnel, and other designated staff members of _________________________school to perform and carry out the diabetes care tasks as outlined by Laura Parrish's Diabetes Medical Management Plan.  I also consent to the release of the information contained in this Diabetes Medical Management Plan to all staff members and other adults who have custodial care of Laura Parrish and who may need to know this information to maintain Laura Parrish health and safety.       Provider Signature: Laura Parrish, PharmD, BCACP, CDCES, CPP             Date: 08/27/2020  Parent/Guardian Signature:  _______________________  Date: ___________________

## 2020-08-27 NOTE — Patient Instructions (Signed)
Follow up visit in 4 months. Please send in Dexcom readings about two weeks after starting. Please obtain lab test 1-2 weeks prior to next visit.   At [facility], we are committed to providing exceptional care. You will receive a patient satisfaction survey through text or email regarding your visit today. Your opinion is important to me. Comments are appreciated.

## 2020-08-28 ENCOUNTER — Encounter (INDEPENDENT_AMBULATORY_CARE_PROVIDER_SITE_OTHER): Payer: Self-pay | Admitting: Pediatrics

## 2020-08-28 NOTE — Telephone Encounter (Signed)
Received response from [employer] that the Dexcom G6 CGM (sensors, transmitter, receiver) does not require clinical prior authorization.   Receiver response from [facility] Managed Medicaid that Dexcom G6 CGM prior authorizations were denied. Will pursue appeal at this time.   Thank you for involving clinical pharmacist/diabetes educator to assist in providing this patient's care.   Zachery Conch, PharmD, BCACP, CDCES, CPP

## 2020-08-29 ENCOUNTER — Other Ambulatory Visit: Payer: Self-pay | Admitting: Pediatrics

## 2020-08-30 NOTE — Telephone Encounter (Signed)
Resubmitted PA on 08/30/20    NCTracks Dexcom G6 sensors (Confirmation #: [id number] W) Dexcom G6 transmitters (Confirmation #:[id number] W) Dexcom G6 receiver (Confirmation #:[id number] W)  Thank you for involving clinical pharmacist/diabetes educator to assist in providing this patient's care.   Zachery Conch, PharmD, BCACP, CDCES, CPP

## 2020-08-31 ENCOUNTER — Encounter (INDEPENDENT_AMBULATORY_CARE_PROVIDER_SITE_OTHER): Payer: Self-pay

## 2020-08-31 MED ORDER — FREESTYLE LIBRE 2 SENSOR MISC: 1.0000 | 11 refills | Status: DC

## 2020-08-31 NOTE — Addendum Note (Signed)
Addended by: Buena Irish on: 08/31/2020 09:30 AM   Modules accepted: Orders

## 2020-08-31 NOTE — Telephone Encounter (Signed)
Contacted [facility] pharmacy  Pharmacy representative informed me when solely billing commercial ([employer] (since [place] rejected PA)) that the copay for the Dexcom would be -Sensors: $35/1 month supply -Transmitter: $87/3 month supply -Receiver: $25 for 1 month supply  Called [employer] agent to determine test claim for [employer] 2.0 CGM sensors (agent confirmed it will not require PA) -Sensors: $35/1 month supply   Will send in prescription for [employer] 2.0 CGM to [facility].  Attempted to patient on 08/31/2020 at 9:29 AM. Unable to leave HIPAA-compliant VM with instructions to call [facility] back.  Will send MyChart to family today regarding information and see which CGM they would prefer to start based on price.  Thank you for involving clinical pharmacist/diabetes educator to assist in providing this patient's care.   Zachery Conch, PharmD, BCACP, CDCES, CPP

## 2020-08-31 NOTE — Telephone Encounter (Signed)
Checked NCTracks on 08/31/20   Dexcom G6 sensors (Confirmation #: [id number] W) - DENIED Dexcom G6 transmitters (Confirmation #:[id number] W)- DENIED Dexcom G6 receiver (Confirmation [id number] W)- DENIED  Thank you for involving clinical pharmacist/diabetes educator to assist in providing this patient's care.   Zachery Conch, PharmD, BCACP, CDCES, CPP

## 2020-09-07 ENCOUNTER — Ambulatory Visit: Payer: Commercial Managed Care - PPO | Admitting: Rheumatology

## 2020-09-12 ENCOUNTER — Other Ambulatory Visit (INDEPENDENT_AMBULATORY_CARE_PROVIDER_SITE_OTHER): Payer: Self-pay | Admitting: Pediatrics

## 2020-09-26 ENCOUNTER — Encounter: Payer: Self-pay | Admitting: Internal Medicine

## 2020-10-02 ENCOUNTER — Other Ambulatory Visit: Payer: Self-pay | Admitting: Internal Medicine

## 2020-10-02 ENCOUNTER — Other Ambulatory Visit (INDEPENDENT_AMBULATORY_CARE_PROVIDER_SITE_OTHER): Payer: Self-pay | Admitting: Family

## 2020-10-02 DIAGNOSIS — G43009 Migraine without aura, not intractable, without status migrainosus: Secondary | ICD-10-CM

## 2020-10-08 ENCOUNTER — Telehealth (INDEPENDENT_AMBULATORY_CARE_PROVIDER_SITE_OTHER): Payer: Self-pay | Admitting: Pediatrics

## 2020-10-08 ENCOUNTER — Other Ambulatory Visit (INDEPENDENT_AMBULATORY_CARE_PROVIDER_SITE_OTHER): Payer: Self-pay | Admitting: Pediatrics

## 2020-10-08 ENCOUNTER — Encounter (INDEPENDENT_AMBULATORY_CARE_PROVIDER_SITE_OTHER): Payer: Self-pay | Admitting: Pediatrics

## 2020-10-08 DIAGNOSIS — E8840 Mitochondrial metabolism disorder, unspecified: Secondary | ICD-10-CM

## 2020-10-08 DIAGNOSIS — E860 Dehydration: Secondary | ICD-10-CM

## 2020-10-08 DIAGNOSIS — R197 Diarrhea, unspecified: Secondary | ICD-10-CM

## 2020-10-08 DIAGNOSIS — R638 Other symptoms and signs concerning food and fluid intake: Secondary | ICD-10-CM

## 2020-10-08 NOTE — Telephone Encounter (Signed)
Message received at the front desk on 10/02/20 requesting peer to peer for IVF hydration.  Patient name not decernable but physician was Dr Rexene Alberts, phone number [phone]. Due to lack of patient information, could not respond.    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   I called Burlene Arnt, case manager documented on paperwork on 10/05/20 with no response.  10/08/20 again called case manager, no response, left message.  Also obtained information from Dr Rexene Alberts, went straight to voicemail, left message.  Called master number provided to me for [employer].  I was transferred to [employer], no response.   Appeal letter written clarifying diagnoses and documentation as to need for fluids, along with last letter, new order, and short description from PT of improvement with fluids.  Faxed to master number attn Burlene Arnt.   Of note, full PT evaluation documenting improvement is in media scanned 01/16/2017 for date 11/2016 if needed.   Patient was supposed to receive IVF today.  Advised family to increase gtube and oral fluids as able until IVF approved.   [name] MD MPH

## 2020-10-10 ENCOUNTER — Ambulatory Visit: Payer: Commercial Managed Care - PPO | Admitting: Rheumatology

## 2020-10-12 ENCOUNTER — Other Ambulatory Visit (INDEPENDENT_AMBULATORY_CARE_PROVIDER_SITE_OTHER): Payer: Self-pay | Admitting: Pediatrics

## 2020-10-16 ENCOUNTER — Encounter: Payer: Self-pay | Admitting: Internal Medicine

## 2020-10-16 ENCOUNTER — Other Ambulatory Visit: Payer: Self-pay | Admitting: Internal Medicine

## 2020-10-16 MED ORDER — TRAZODONE HCL 50 MG PO TABS: ORAL_TABLET | ORAL | 1 refills | Status: DC

## 2020-10-17 NOTE — Progress Notes (Signed)
Office Visit Note  Patient: Laura Parrish             Date of Birth: 2000-05-02           MRN: [id number]             PCP: Jose Persia, MD Referring: Jose Persia, MD Visit Date: 10/30/2020 Occupation: '@GUAROCC' @  Subjective:  Raynauds and joint pain   History of Present Illness: Laura Parrish is a 20 y.o. female seen in consultation per request of her PCP.  According to the patient and her mother her symptoms a started at age 85 with hands and feet being cold and sweaty.  She states that her hands will turn purple.  At the time she was living in [place] and she was seen by neurologist for Chiari malformation.  She has not seen a rheumatologist.  She always wears warm clothing and gloves when her hands and feet get cold.  There is no history of digital ulcers although her skin gets dry at the fingertips.  She states she also hurts all over her body since she was a child.  She gives history of joint pain, muscle pain and intermittent joint swelling which she describes in her hands, knees and her ankles.  There is no history of oral ulcers, nasal ulcers.  She gives history of dry mouth and dry eyes.  She also gives history of intermittent rashes and sun sensitivity.  There is no history of lymphadenopathy.  Her mother has positive ANA and grandmother had rheumatoid arthritis and fibromyalgia.  Activities of Daily Living:  Patient reports morning stiffness for all day. Patient Denies nocturnal pain.  Difficulty dressing/grooming: Denies Difficulty climbing stairs: Reports Difficulty getting out of chair: Reports Difficulty using hands for taps, buttons, cutlery, and/or writing: Reports  Review of Systems  Constitutional:  Positive for fatigue.  HENT:  Positive for mouth dryness. Negative for mouth sores and nose dryness.   Eyes:  Positive for itching and dryness.  Respiratory:  Positive for shortness of breath. Negative for difficulty breathing.   Cardiovascular:  Positive for  chest pain and palpitations.  Gastrointestinal:  Positive for constipation and diarrhea. Negative for blood in stool.  Endocrine: Positive for increased urination.  Genitourinary:  Positive for difficulty urinating. Negative for painful urination.  Musculoskeletal:  Positive for joint pain, joint pain, joint swelling, myalgias, morning stiffness, muscle tenderness and myalgias.  Skin:  Positive for color change, rash and sensitivity to sunlight.  Allergic/Immunologic: Positive for susceptible to infections.  Neurological:  Positive for dizziness, numbness, headaches, memory loss and weakness.  Hematological:  Positive for bruising/bleeding tendency. Negative for swollen glands.  Psychiatric/Behavioral:  Positive for sleep disturbance. Negative for depressed mood. The patient is not nervous/anxious.    PMFS History:  Patient Active Problem List   Diagnosis Date Noted   Insomnia 05/31/2020   Dysautonomia ([place]) 12/02/2019   Myasthenia gravis ([place]) 12/02/2019   Healthcare maintenance 12/02/2019   Raynaud's phenomenon 12/01/2019   Menorrhagia 12/01/2019   Referred ear pain, bilateral 11/01/2019   Feeding by G-tube ([place]) 03/07/2019   Thyroiditis, autoimmune 02/06/2018   Goiter 02/06/2018   Weakness 02/06/2018   Hyperprolactinemia ([place]) 02/06/2018   Hyperhidrosis of palms and soles 02/06/2018   Dehydration 02/06/2018   At risk for dehydration 10/12/2017   Breakthrough bleeding on birth control pills 06/25/2017   Inadequate fluid intake 06/25/2017   Ehlers-Danlos syndrome 12/29/2016   Snoring 09/28/2016   Port-A-Cath in place 09/22/2016   Iron  deficiency anemia 07/23/2016   Health care home, active care coordination 07/23/2016   Complex care coordination 07/23/2016   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   Chronic pain 05/22/2016   Clubfoot, 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   Paresthesia 05/22/2016   Premature birth 05/22/2016   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/2016   Allergy to insect bites and stings 04/10/2016   Atopic dermatitis 04/10/2016   Contact dermatitis due to chemicals 04/10/2016   Chronic rhinitis 04/10/2016   Allergic urticaria 04/10/2016   Chronic fatigue 02/15/2015   Lactose intolerance 01/27/2013   Migraine without aura and without status migrainosus, not intractable 10/01/2012   Mitochondrial disease ([place]) 03/15/2012   Irregular menstrual cycle 09/29/2011   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   [place] 05/29/2000    Past Medical History:  Diagnosis Date   Allergy to alpha-gal    elevated IgE 04/10/16   Autonomic dysfunction    Chiari I malformation (HCC)    Complication of anesthesia    slow to wake up   Dyspnea    Eczema    GERD (gastroesophageal reflux disease)    Headache    Heart murmur    Hypoglycemia    Hypothyroid    IBS (irritable bowel syndrome)    Joint pain    Mitochondrial disease (HCC)    Muscle pain    Pneumonia    several times   PONV (postoperative nausea and vomiting)     Family History  Problem Relation Age of Onset   Asthma Mother    Allergic rhinitis Mother    Heart murmur Mother    Allergic rhinitis Sister    Thyroid disease Sister    Allergic rhinitis Brother    Asthma Brother    Seizures Brother    Mitochondrial disorder Brother    Allergic rhinitis Maternal Grandmother    Asthma Maternal Grandmother    Diabetes type II Maternal Grandmother    Lung cancer Maternal Grandfather    Angioedema Neg Hx    Eczema Neg Hx    Immunodeficiency Neg Hx    Urticaria Neg Hx    Past Surgical History:  Procedure Laterality Date   ADENOIDECTOMY     2004or 2005   chiari decompression  2007   GALLBLADDER SURGERY  2015   GASTROSTOMY TUBE PLACEMENT     x 2   IR CV LINE INJECTION  07/06/2020   MUSCLE BIOPSY  2010   Port Replacement  05/2018   x 2 orignal 08/2016 then replacement 05/2018   PORT-A-CATH REMOVAL N/A 03/24/2019   Procedure: PORT-A-CATH REMOVAL;  Surgeon:  Mickeal Skinner, MD;  Location: [facility];  Service: General;  Laterality: N/A;   PORTACATH PLACEMENT Right 03/24/2019   Procedure: PORT-A-CATH INSERTION under ultrasound and fluoroscopic guidance;  Surgeon: Mickeal Skinner, MD;  Location: [facility];  Service: General;  Laterality: Right;   REMOVAL OF GASTROSTOMY TUBE N/A 03/24/2019   Procedure: Exchange of Gastrostomy Tube;  Surgeon: Mickeal Skinner, MD;  Location: [facility];  Service: General;  Laterality: N/A;   tethered cord  07/2006   TONSILLECTOMY     [id number]   Social History   Social History Narrative   Kristyl graduated from [employer]. Attends [employer]. She is majoring in History This is her senior year!    She enjoys reading, playing with her cat and her dog, and hanging out with her sister.    Immunization History  Administered Date(s) Administered   DTaP 07/25/1999, 10/02/1999,  11/27/1999, 09/08/2000, 08/21/2004   H1N1 11/10/2007   HPV Quadrivalent 05/17/2012   Hepatitis A, Adult 08/21/2004, 11/10/2007   Hepatitis B, adult 2000-02-23, 06/27/1999, 11/27/1999   HiB (PRP-OMP) 07/25/1999, 10/02/1999, 11/27/1999, 09/08/2000   IPV 07/25/1999, 10/02/1999, 09/08/2000, 08/21/2004   Influenza, Seasonal, Injecte, Preservative Fre 11/10/2007, 10/18/2008, 10/01/2009, 11/01/2010, 10/31/2011   Influenza,inj,Quad PF,6+ Mos 10/13/2013, 10/12/2014, 10/03/2015, 10/15/2018, 11/17/2019   Influenza,trivalent, recombinat, inj, PF 10/08/2012   Influenza-Unspecified 10/12/2014   MMR 06/29/2000, 08/21/2004   Meningococcal Conjugate 06/05/2009   PFIZER Comirnaty(Gray Top)Covid-19 Tri-Sucrose Vaccine 01/17/2019, 05/02/2019, 05/23/2019   Pneumococcal-Unspecified 07/25/1999, 10/02/1999, 11/27/1999, 06/29/2000   Tdap 06/05/2009   Varicella 06/29/2000, 05/07/2009     Objective: Vital Signs: BP 126/78 (BP Location: Right Arm, Patient Position: Sitting, Cuff Size: Normal)   Pulse 72   Ht 5' 4.5" (1.638 m)   Wt 125  lb 12.8 oz (57.1 kg)   BMI 21.26 kg/m    Physical Exam Vitals and nursing note reviewed.  Constitutional:      Appearance: She is well-developed.  HENT:     Head: Normocephalic and atraumatic.  Eyes:     Conjunctiva/sclera: Conjunctivae normal.  Cardiovascular:     Rate and Rhythm: Normal rate and regular rhythm.     Heart sounds: Normal heart sounds.  Pulmonary:     Effort: Pulmonary effort is normal.     Breath sounds: Normal breath sounds.  Abdominal:     General: Bowel sounds are normal.     Palpations: Abdomen is soft.  Musculoskeletal:     Cervical back: Normal range of motion.  Lymphadenopathy:     Cervical: No cervical adenopathy.  Skin:    General: Skin is warm and dry.     Capillary Refill: Capillary refill takes less than 2 seconds.     Comments: Acne noted on the face and back.  Hyperhidrosis was noted in her hands and her feet.  No nailbed capillary changes, sclerodactyly was noted.  She had good capillary refill.  No digital ulcers were noted.  Neurological:     Mental Status: She is alert and oriented to person, place, and time.  Psychiatric:        Behavior: Behavior normal.     Musculoskeletal Exam: C-spine was in good range of motion.  She had limited range of motion of her thoracic and lumbar spine due to discomfort.  Shoulder joints, elbow joints, wrist joints, MCPs PIPs and DIPs with good range of motion with no synovitis.  Hip joints, knee joints, ankles, MTPs and PIPs with good range of motion with no synovitis.  She had hypermobility of her joints.  No skin laxity was noted.  She had generalized hyperalgesia.  CDAI Exam: CDAI Score: -- Patient Global: --; Provider Global: -- Swollen: --; Tender: -- Joint Exam 10/30/2020   No joint exam has been documented for this visit   There is currently no information documented on the homunculus. Go to the Rheumatology activity and complete the homunculus joint exam.  Investigation: No additional  findings.  Imaging: No results found.  Recent Labs: Lab Results  Component Value Date   WBC 8.3 08/15/2020   HGB 14.7 08/15/2020   PLT 351 08/15/2020   NA 139 08/15/2020   K 4.0 08/15/2020   CL 108 08/15/2020   CO2 19 (L) 08/15/2020   GLUCOSE 99 08/15/2020   BUN 8 08/15/2020   CREATININE 0.82 08/15/2020   BILITOT 0.6 08/15/2020   AST 17 08/15/2020   ALT 13 08/15/2020   PROT  7.2 08/15/2020   CALCIUM 9.5 08/15/2020   GFRAA >60 03/24/2019    Speciality Comments: No specialty comments available.  Procedures:  No procedures performed Allergies: Other, Latex, Lactated ringers, Propofol, and Tape   Assessment / Plan:     Visit Diagnoses: Raynaud's phenomenon without gangrene - 05/31/20: ANA negative, dsDNA 1, RF<10, anti-CCP 1.  Patient gets history of Raynaud's since she was 20 years old.  She states she has hyperhidrosis and her hands get really cold.  She has noticed purple discoloration in her hands and her feet.  There is no history of digital ulcers.  She had good capillary refill and no nailbed capillary changes.  All the autoimmune work-up has been negative so far.  I will obtainAVISE labs to complete the work-up.  Hyperhidrosis of palms and soles-she has significant hyperhidrosis in her hands and her feet which probably lowers the temperature in her hands and feet.  Myofascial pain-she continues to have some generalized pain and discomfort and had multiple tender points.  There is positive family history of fibromyalgia in her maternal grandmother.  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  Ehlers-Danlos syndrome-according to patient's mother she has been seen by geneticist and was diagnosed with Ehlers-Danlos syndrome.  History of IBS - She had endoscopy and colonoscopy in 2015.  She was diagnosed with IBS by the gastroenterologist.  She has diarrhea alternating with constipation.  Migraine without aura and without status migrainosus, not  intractable  Increased frequency of urination-it is not uncommon to see interstitial cystitis with myofascial pain and IBS.  I advised neurology work-up if her symptoms persist.  Other medical problems are listed as follows:  Syringomyelia and syringobulbia (HCC)  Dysautonomia ([place])  Chiari I malformation (HCC)  Myasthenia gravis ([place])  Thyroiditis, autoimmune  Goiter  Hyperprolactinemia ([place])  Chronic rhinitis  Allergic urticaria  Paresthesia  Mitochondrial disease ([place])  Alpha-gal hypersensitivity  Family history of rheumatoid arthritis - Maternal grandmother  Orders: No orders of the defined types were placed in this encounter.  No orders of the defined types were placed in this encounter.    Follow-Up Instructions: Return for raynauds phenomenon.   Bo Merino, MD  Note - This record has been created using Editor, commissioning.  Chart creation errors have been sought, but may not always  have been located. Such creation errors do not reflect on  the standard of medical care.

## 2020-10-24 ENCOUNTER — Ambulatory Visit: Payer: Medicaid Other | Admitting: Rheumatology

## 2020-10-25 ENCOUNTER — Ambulatory Visit: Payer: Medicaid Other | Admitting: Rheumatology

## 2020-10-30 ENCOUNTER — Other Ambulatory Visit: Payer: Self-pay

## 2020-10-30 ENCOUNTER — Ambulatory Visit (INDEPENDENT_AMBULATORY_CARE_PROVIDER_SITE_OTHER): Payer: PRIVATE HEALTH INSURANCE | Admitting: Rheumatology

## 2020-10-30 ENCOUNTER — Encounter: Payer: Self-pay | Admitting: Rheumatology

## 2020-10-30 VITALS — BP 126/78 | HR 72 | Ht 64.5 in | Wt 125.8 lb

## 2020-10-30 DIAGNOSIS — T7800XA Anaphylactic reaction due to unspecified food, initial encounter: Secondary | ICD-10-CM

## 2020-10-30 DIAGNOSIS — Q6689 Other  specified congenital deformities of feet: Secondary | ICD-10-CM

## 2020-10-30 DIAGNOSIS — E8840 Mitochondrial metabolism disorder, unspecified: Secondary | ICD-10-CM

## 2020-10-30 DIAGNOSIS — L74512 Primary focal hyperhidrosis, palms: Secondary | ICD-10-CM | POA: Diagnosis not present

## 2020-10-30 DIAGNOSIS — Z8719 Personal history of other diseases of the digestive system: Secondary | ICD-10-CM

## 2020-10-30 DIAGNOSIS — M7918 Myalgia, other site: Secondary | ICD-10-CM

## 2020-10-30 DIAGNOSIS — I7300 Raynaud's syndrome without gangrene: Secondary | ICD-10-CM

## 2020-10-30 DIAGNOSIS — L74513 Primary focal hyperhidrosis, soles: Secondary | ICD-10-CM

## 2020-10-30 DIAGNOSIS — G43009 Migraine without aura, not intractable, without status migrainosus: Secondary | ICD-10-CM

## 2020-10-30 DIAGNOSIS — E049 Nontoxic goiter, unspecified: Secondary | ICD-10-CM

## 2020-10-30 DIAGNOSIS — Q796 Ehlers-Danlos syndrome, unspecified: Secondary | ICD-10-CM

## 2020-10-30 DIAGNOSIS — G7000 Myasthenia gravis without (acute) exacerbation: Secondary | ICD-10-CM

## 2020-10-30 DIAGNOSIS — L500 Allergic urticaria: Secondary | ICD-10-CM

## 2020-10-30 DIAGNOSIS — R202 Paresthesia of skin: Secondary | ICD-10-CM

## 2020-10-30 DIAGNOSIS — J310 Chronic rhinitis: Secondary | ICD-10-CM

## 2020-10-30 DIAGNOSIS — Z8261 Family history of arthritis: Secondary | ICD-10-CM

## 2020-10-30 DIAGNOSIS — E221 Hyperprolactinemia: Secondary | ICD-10-CM

## 2020-10-30 DIAGNOSIS — G901 Familial dysautonomia [Riley-Day]: Secondary | ICD-10-CM

## 2020-10-30 DIAGNOSIS — R350 Frequency of micturition: Secondary | ICD-10-CM

## 2020-10-30 DIAGNOSIS — G950 Syringomyelia and syringobulbia: Secondary | ICD-10-CM

## 2020-10-30 DIAGNOSIS — G935 Compression of brain: Secondary | ICD-10-CM

## 2020-10-30 DIAGNOSIS — E063 Autoimmune thyroiditis: Secondary | ICD-10-CM

## 2020-11-08 NOTE — Progress Notes (Deleted)
Office Visit Note  Patient: Laura Parrish             Date of Birth: 04-Nov-2000           MRN: [id number]             PCP: Laura Lennert, MD Referring: Laura Parrish,* Visit Date: 11/21/2020 Occupation: @GUAROCC @  Subjective:  No chief complaint on file.   History of Present Illness: Laura Parrish is a 20 y.o. female ***   Activities of Daily Living:  Patient reports morning stiffness for *** {minute/hour:19697}.   Patient {ACTIONS;DENIES/REPORTS:21021675::"Denies"} nocturnal pain.  Difficulty dressing/grooming: {ACTIONS;DENIES/REPORTS:21021675::"Denies"} Difficulty climbing stairs: {ACTIONS;DENIES/REPORTS:21021675::"Denies"} Difficulty getting out of chair: {ACTIONS;DENIES/REPORTS:21021675::"Denies"} Difficulty using hands for taps, buttons, cutlery, and/or writing: {ACTIONS;DENIES/REPORTS:21021675::"Denies"}  No Rheumatology ROS completed.   PMFS History:  Patient Active Problem List   Diagnosis Date Noted   Insomnia 05/31/2020   Dysautonomia (HCC) 12/02/2019   Myasthenia gravis (HCC) 12/02/2019   Healthcare maintenance 12/02/2019   Raynaud's phenomenon 12/01/2019   Menorrhagia 12/01/2019   Referred ear pain, bilateral 11/01/2019   Feeding by G-tube (HCC) 03/07/2019   Thyroiditis, autoimmune 02/06/2018   Goiter 02/06/2018   Weakness 02/06/2018   Hyperprolactinemia (HCC) 02/06/2018   Hyperhidrosis of palms and soles 02/06/2018   Dehydration 02/06/2018   At risk for dehydration 10/12/2017   Breakthrough bleeding on birth control pills 06/25/2017   Inadequate fluid intake 06/25/2017   Ehlers-Danlos syndrome 12/29/2016   Snoring 09/28/2016   Port-A-Cath in place 09/22/2016   Iron deficiency anemia 07/23/2016   Health care home, active care coordination 07/23/2016   Complex care coordination 07/23/2016   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   Chronic pain 05/22/2016   Clubfoot, 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   Paresthesia 05/22/2016   Premature birth  05/22/2016   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   Allergy to insect bites and stings 04/10/2016   Atopic dermatitis 04/10/2016   Contact dermatitis due to chemicals 04/10/2016   Chronic rhinitis 04/10/2016   Allergic urticaria 04/10/2016   Chronic fatigue 02/15/2015   Lactose intolerance 01/27/2013   Migraine without aura and without status migrainosus, not intractable 10/01/2012   Mitochondrial disease (HCC) 03/15/2012   Irregular menstrual cycle 09/29/2011   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   Talipes 05/29/2000    Past Medical History:  Diagnosis Date   Allergy to alpha-gal    elevated IgE 04/10/16   Autonomic dysfunction    Chiari I malformation (HCC)    Complication of anesthesia    slow to wake up   Dyspnea    Eczema    GERD (gastroesophageal reflux disease)    Headache    Heart murmur    Hypoglycemia    Hypothyroid    IBS (irritable bowel syndrome)    Joint pain    Mitochondrial disease (HCC)    Muscle pain    Pneumonia    several times   PONV (postoperative nausea and vomiting)     Family History  Problem Relation Age of Onset   Asthma Mother    Allergic rhinitis Mother    Heart murmur Mother    Allergic rhinitis Sister    Thyroid disease Sister    Allergic rhinitis Brother    Asthma Brother    Seizures Brother    Mitochondrial disorder Brother    Allergic rhinitis Maternal Grandmother    Asthma Maternal Grandmother    Diabetes type II Maternal Grandmother    Lung cancer Maternal Grandfather  Angioedema Neg Hx    Eczema Neg Hx    Immunodeficiency Neg Hx    Urticaria Neg Hx    Past Surgical History:  Procedure Laterality Date   ADENOIDECTOMY     2004or 2005   chiari decompression  2007   GALLBLADDER SURGERY  2015   GASTROSTOMY TUBE PLACEMENT     x 2   IR CV LINE INJECTION  07/06/2020   MUSCLE BIOPSY  2010   Port Replacement  05/2018   x 2 orignal 08/2016 then replacement 05/2018    PORT-A-CATH REMOVAL N/A 03/24/2019   Procedure: PORT-A-CATH REMOVAL;  Surgeon: Laura Skinner, MD;  Location: [facility];  Service: General;  Laterality: N/A;   PORTACATH PLACEMENT Right 03/24/2019   Procedure: PORT-A-CATH INSERTION under ultrasound and fluoroscopic guidance;  Surgeon: Laura Skinner, MD;  Location: [facility];  Service: General;  Laterality: Right;   REMOVAL OF GASTROSTOMY TUBE N/A 03/24/2019   Procedure: Exchange of Gastrostomy Tube;  Surgeon: Laura Skinner, MD;  Location: [facility];  Service: General;  Laterality: N/A;   tethered cord  07/2006   TONSILLECTOMY     [id number]   Social History   Social History Narrative   Laura Parrish graduated from [employer]. Attends [employer]. She is majoring in History This is her senior year!    She enjoys reading, playing with her cat and her dog, and hanging out with her sister.    Immunization History  Administered Date(s) Administered   DTaP 07/25/1999, 10/02/1999, 11/27/1999, 09/08/2000, 08/21/2004   H1N1 11/10/2007   HPV Quadrivalent 05/17/2012   Hepatitis A, Adult 08/21/2004, 11/10/2007   Hepatitis B, adult 18[birth year]Feb[birth year]1999, 06/27/1999, 11/27/1999   HiB (PRP-OMP) 07/25/1999, 10/02/1999, 11/27/1999, 09/08/2000   IPV 07/25/1999, 10/02/1999, 09/08/2000, 08/21/2004   Influenza, Seasonal, Injecte, Preservative Fre 11/10/2007, 10/18/2008, 10/01/2009, 11/01/2010, 10/31/2011   Influenza,inj,Quad PF,6+ Mos 10/13/2013, 10/12/2014, 10/03/2015, 10/15/2018, 11/17/2019   Influenza,trivalent, recombinat, inj, PF 10/08/2012   Influenza-Unspecified 10/12/2014   MMR 06/29/2000, 08/21/2004   Meningococcal Conjugate 06/05/2009   PFIZER Comirnaty(Gray Top)Covid-19 Tri-Sucrose Vaccine 01/17/2019, 05/02/2019, 05/23/2019   Pneumococcal-Unspecified 07/25/1999, 10/02/1999, 11/27/1999, 06/29/2000   Tdap 06/05/2009   Varicella 06/29/2000, 05/07/2009     Objective: Vital Signs: There were no vitals taken for this visit.    Physical Exam   Musculoskeletal Exam: ***  CDAI Exam: CDAI Score: -- Patient Global: --; Provider Global: -- Swollen: --; Tender: -- Joint Exam 11/21/2020   No joint exam has been documented for this visit   There is currently no information documented on the homunculus. Go to the Rheumatology activity and complete the homunculus joint exam.  Investigation: No additional findings.  Imaging: No results found.  Recent Labs: Lab Results  Component Value Date   WBC 8.3 08/15/2020   HGB 14.7 08/15/2020   PLT 351 08/15/2020   NA 139 08/15/2020   K 4.0 08/15/2020   CL 108 08/15/2020   CO2 19 (L) 08/15/2020   GLUCOSE 99 08/15/2020   BUN 8 08/15/2020   CREATININE 0.82 08/15/2020   BILITOT 0.6 08/15/2020   AST 17 08/15/2020   ALT 13 08/15/2020   PROT 7.2 08/15/2020   CALCIUM 9.5 08/15/2020   GFRAA >60 03/24/2019     05/31/20: ANA negative, dsDNA 1, RF<10, anti-CCP 1  Speciality Comments: No specialty comments available.  Procedures:  No procedures performed Allergies: Other, Latex, Lactated ringers, Propofol, and Tape   Assessment / Plan:     Visit Diagnoses: No diagnosis found.  Orders: No orders of  the defined types were placed in this encounter.  No orders of the defined types were placed in this encounter.   Face-to-face time spent with patient was *** minutes. Greater than 50% of time was spent in counseling and coordination of care.  Follow-Up Instructions: No follow-ups on file.   Bo Merino, MD  Note - This record has been created using Editor, commissioning.  Chart creation errors have been sought, but may not always  have been located. Such creation errors do not reflect on  the standard of medical care.

## 2020-11-12 ENCOUNTER — Other Ambulatory Visit (INDEPENDENT_AMBULATORY_CARE_PROVIDER_SITE_OTHER): Payer: Self-pay | Admitting: Family

## 2020-11-21 ENCOUNTER — Ambulatory Visit: Payer: Medicaid Other | Admitting: Rheumatology

## 2020-11-21 ENCOUNTER — Telehealth (INDEPENDENT_AMBULATORY_CARE_PROVIDER_SITE_OTHER): Payer: Self-pay | Admitting: Pediatrics

## 2020-11-21 DIAGNOSIS — R350 Frequency of micturition: Secondary | ICD-10-CM

## 2020-11-21 DIAGNOSIS — G901 Familial dysautonomia [Riley-Day]: Secondary | ICD-10-CM

## 2020-11-21 DIAGNOSIS — Z8261 Family history of arthritis: Secondary | ICD-10-CM

## 2020-11-21 DIAGNOSIS — G959 Disease of spinal cord, unspecified: Secondary | ICD-10-CM

## 2020-11-21 DIAGNOSIS — T7800XA Anaphylactic reaction due to unspecified food, initial encounter: Secondary | ICD-10-CM

## 2020-11-21 DIAGNOSIS — L500 Allergic urticaria: Secondary | ICD-10-CM

## 2020-11-21 DIAGNOSIS — M7918 Myalgia, other site: Secondary | ICD-10-CM

## 2020-11-21 DIAGNOSIS — G950 Syringomyelia and syringobulbia: Secondary | ICD-10-CM

## 2020-11-21 DIAGNOSIS — E8840 Mitochondrial metabolism disorder, unspecified: Secondary | ICD-10-CM

## 2020-11-21 DIAGNOSIS — J310 Chronic rhinitis: Secondary | ICD-10-CM

## 2020-11-21 DIAGNOSIS — Q068 Other specified congenital malformations of spinal cord: Secondary | ICD-10-CM

## 2020-11-21 DIAGNOSIS — E063 Autoimmune thyroiditis: Secondary | ICD-10-CM

## 2020-11-21 DIAGNOSIS — I7300 Raynaud's syndrome without gangrene: Secondary | ICD-10-CM

## 2020-11-21 DIAGNOSIS — R202 Paresthesia of skin: Secondary | ICD-10-CM

## 2020-11-21 DIAGNOSIS — Q796 Ehlers-Danlos syndrome, unspecified: Secondary | ICD-10-CM

## 2020-11-21 DIAGNOSIS — Q6689 Other  specified congenital deformities of feet: Secondary | ICD-10-CM

## 2020-11-21 DIAGNOSIS — E221 Hyperprolactinemia: Secondary | ICD-10-CM

## 2020-11-21 DIAGNOSIS — Z8719 Personal history of other diseases of the digestive system: Secondary | ICD-10-CM

## 2020-11-21 DIAGNOSIS — G935 Compression of brain: Secondary | ICD-10-CM

## 2020-11-21 DIAGNOSIS — L74512 Primary focal hyperhidrosis, palms: Secondary | ICD-10-CM

## 2020-11-21 DIAGNOSIS — G7000 Myasthenia gravis without (acute) exacerbation: Secondary | ICD-10-CM

## 2020-11-21 DIAGNOSIS — G43009 Migraine without aura, not intractable, without status migrainosus: Secondary | ICD-10-CM

## 2020-11-21 NOTE — Telephone Encounter (Signed)
Home health nurse and NP both reached out today requesting that I called mother. I unfortunately couldn't do that until tonight. Mother reports she woke up this morning in horrible pain, having bladder leakage.  Missed school today and doesn't think she can get in the car to go tomorrow. She has been able to stand, but has been experiencing weakness and "shakiness" including in the arms and legs. She describes the pain as neuropathy pain, not like a muscle spasm. Described as mid lower back, along the spine. Took skelaxin, lyrica, and naproxyn with some improvement.  Also giving lidocaine patch.   I advised that given her history of chiari malformation s/p repair, syringomyelia and tethered cord, she should go to the emergency room to be evaluated.  Mother and Laura Parrish declined and want to try to do it outpatient.  I advised that I could order an MRI, but I am not sure that it will be approved and not sure how long it will take to get approved.  Mother and Laura Parrish agree with trying to get approval tomorrow, seeing how things go.  She [place]ises [place]- Gavin Pound [phone]. I have placed an order for MRI brain to evaluate chiari malformation, as well as total spine given arm and leg involvement and history of tethered cord repair.   As a side note, Laura Parrish has gone back to getting infusions.  Ended up not needing a PA after all.  She has been getting infusions for the last month.    [name] MD MPH

## 2020-11-22 NOTE — Telephone Encounter (Signed)
MRI Authorization under review Case number [id number].

## 2020-11-27 NOTE — Telephone Encounter (Signed)
Contacted mom and let her know the MRI was authorized. She informs [facility] called her yesterday to schedule but they are unable to do the complete spine MRI. They would need to do a thoracic, lumbar, and cervical.  Mom does not want to take her to ED as Derin is immunocompromised. She was asking if a direct admit was a possibility.  Mom informs patient is in a fair amount of pain. She was only able to tolerate going to class for 30 minutes and could barely tolerate the car ride to and from class.   Mom will be taking patients brother to and from an appointment. Will be available on her cell [phone]

## 2020-11-28 MED ORDER — LIDOCAINE 5 % EX PTCH: 1.0000 | MEDICATED_PATCH | CUTANEOUS | 0 refills | Status: DC

## 2020-11-28 MED ORDER — METAXALONE 800 MG PO TABS: 800.0000 mg | ORAL_TABLET | Freq: Three times a day (TID) | ORAL | 0 refills | Status: DC

## 2020-11-28 NOTE — Telephone Encounter (Signed)
New orders placed for MRI spine.  Patient still needs MRI brain (previously approved).   I unfortunately can not coordinate a direct admission, because of her age she would need to go to the adult unit and I do not have admitting privileges there.   She has had difficulty going to school since Monday. I contacted mother and recommend not taking her to school, do only virtual school or stay out and I will write a letter to excuse her for the week.   She is having nausea, treated with phenergan. I reviewed medications for pain, she is already taking Lyrica, Skelaxin, Lidocaine ointment, and Naproxen.  I recommend increasing Skelaxin to full tablet 3 times daily.I will also prescribe Lidocaine patches.  Discussed enteral opioids, but she doesn't want them and I wouldn't advise them.    [name] MD MPH

## 2020-11-29 ENCOUNTER — Encounter (INDEPENDENT_AMBULATORY_CARE_PROVIDER_SITE_OTHER): Payer: Self-pay | Admitting: Pediatrics

## 2020-11-29 ENCOUNTER — Telehealth (INDEPENDENT_AMBULATORY_CARE_PROVIDER_SITE_OTHER): Payer: Self-pay | Admitting: Pediatrics

## 2020-11-29 DIAGNOSIS — G935 Compression of brain: Secondary | ICD-10-CM

## 2020-11-29 DIAGNOSIS — G950 Syringomyelia and syringobulbia: Secondary | ICD-10-CM

## 2020-11-29 DIAGNOSIS — G959 Disease of spinal cord, unspecified: Secondary | ICD-10-CM

## 2020-11-29 DIAGNOSIS — Q068 Other specified congenital malformations of spinal cord: Secondary | ICD-10-CM

## 2020-11-29 NOTE — Telephone Encounter (Signed)
Contacted Centralized scheduling. They have appointments available within a week at [facility]. Left voicemail with mom to see if this would okay with them. They wouldn't have to wait in the ED, they would wait in a small waiting room for the radiology department. Not a direct admit, but as close as we will be able to do for them, and much sooner. Once a response is given by mom and patient will contact insurance and change the authorization location, and change order to [facility].

## 2020-11-29 NOTE — Telephone Encounter (Signed)
  Who's calling (name and relationship to patient) :Akilah Cureton  Best contact number: [phone] Provider they see: Artis Flock Reason for call: Please contact mom she states that the MRI can not be done until 12/4 and 12/6 . Mom was hoping to get completed sooner    PRESCRIPTION REFILL ONLY  Name of prescription:  Pharmacy:

## 2020-11-29 NOTE — Telephone Encounter (Signed)
Mom returned call, states she has her cell phone on her and is able to receive call

## 2020-11-30 NOTE — Telephone Encounter (Signed)
Late documentation  Spoke with mom 11/29/2020 at the end of the day and let her know about the MRI at [facility]. Mom informs she is going to talk with the PCP and see if they can do a direct admit as Dr. Artis Flock does not have the privileges for an adult patient. She would like to continue with the MRI through [place] in the off chance PCP is unable to get her admitted. Will contact insurance company and get the PA changed to be done at [facility] MRI

## 2020-11-30 NOTE — Telephone Encounter (Signed)
New orders placed.   [name] MD MPH

## 2020-12-03 ENCOUNTER — Encounter: Payer: Self-pay | Admitting: Internal Medicine

## 2020-12-13 ENCOUNTER — Other Ambulatory Visit (INDEPENDENT_AMBULATORY_CARE_PROVIDER_SITE_OTHER): Payer: Self-pay | Admitting: Pediatrics

## 2020-12-13 NOTE — Telephone Encounter (Signed)
I appoved with refills, but she will not be able to fill it until 12/30/20.   [name] MD MPH

## 2020-12-16 ENCOUNTER — Ambulatory Visit
Admission: RE | Admit: 2020-12-16 | Discharge: 2020-12-16 | Disposition: A | Discharge status: Home / Self Care | Payer: PRIVATE HEALTH INSURANCE | Source: Ambulatory Visit | Attending: Pediatrics | Admitting: Pediatrics

## 2020-12-16 DIAGNOSIS — G950 Syringomyelia and syringobulbia: Secondary | ICD-10-CM

## 2020-12-16 DIAGNOSIS — Q068 Other specified congenital malformations of spinal cord: Secondary | ICD-10-CM

## 2020-12-16 IMAGING — MR MR THORACIC SPINE W/O CM
4 of 6 series · 20 of 48 positions shown · non-contrast
Comparison: None.

CLINICAL DATA: Syringomyelia or syringobulbia.

EXAM:
MRI THORACIC AND LUMBAR SPINE WITHOUT CONTRAST
TECHNIQUE: Multiplanar and multiecho pulse sequences of the thoracic and lumbar
spine were obtained without intravenous contrast.

[Series 2: T1 · sagittal · 3.0mm · 1.12mm/px · 2 of 7 slices shown]
[im 1/7]
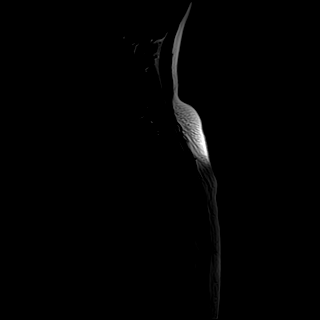
[im 7/7]
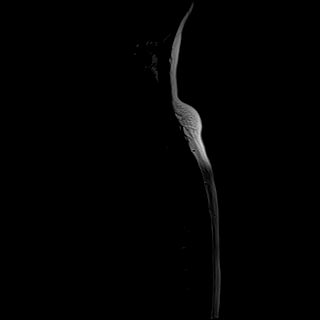

[Series 4: T2 · sagittal · 4.0mm · 0.48mm/px · 6 of 16 slices shown (1 of 3)]
[im 1/16]
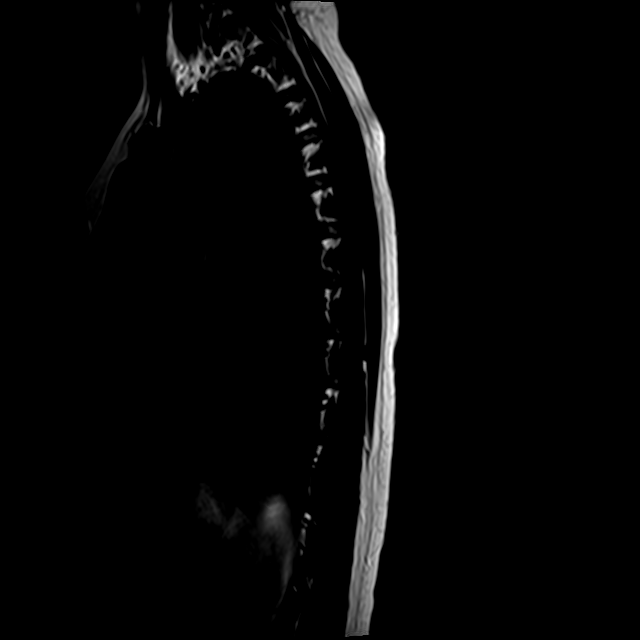
[im 4/16]
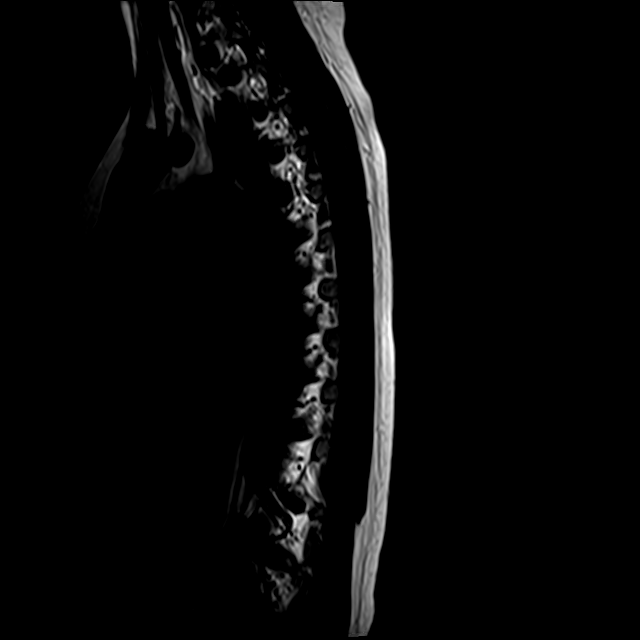
[im 7/16]
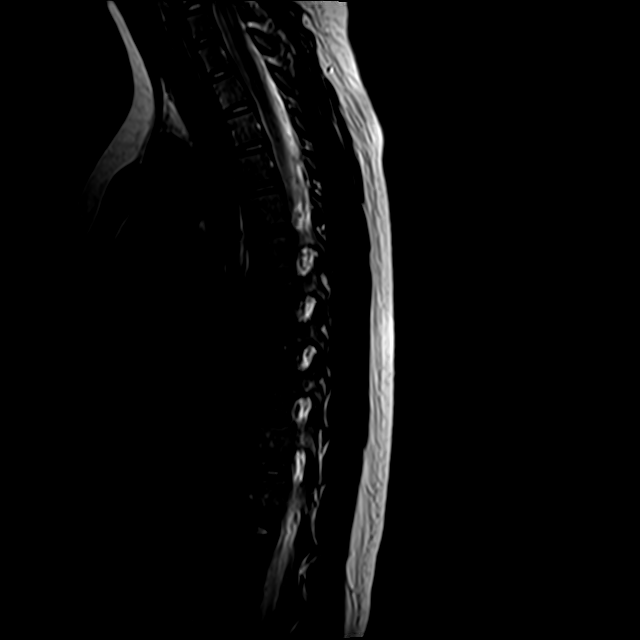
[im 10/16]
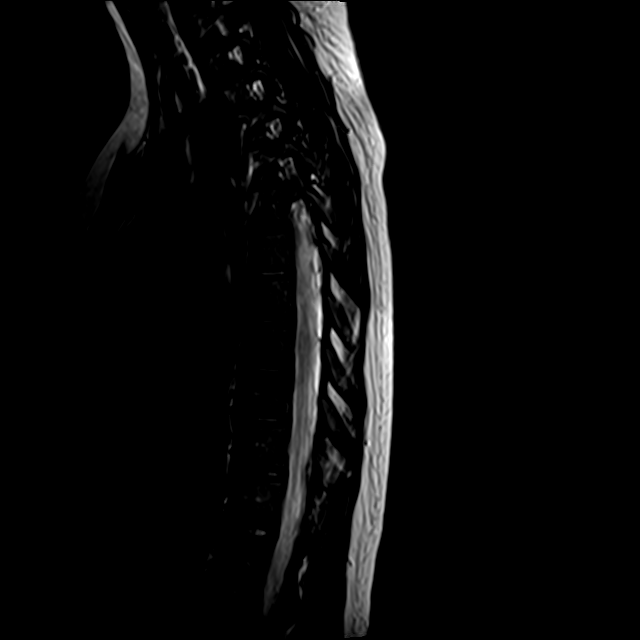
[im 13/16]
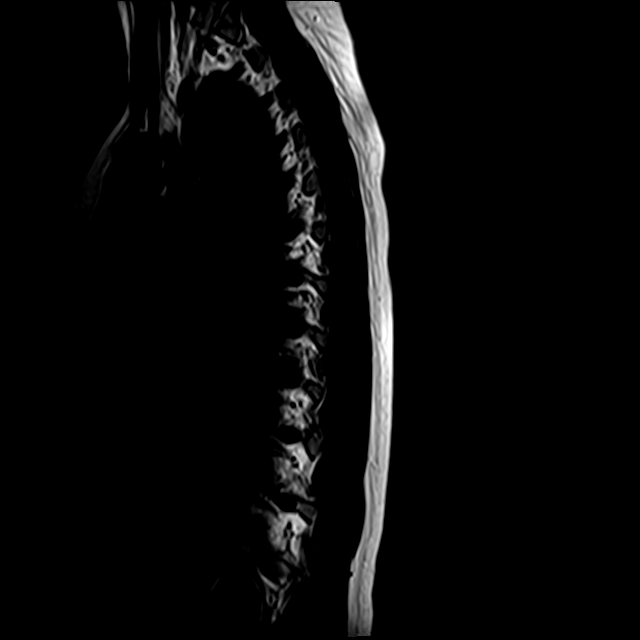
[im 16/16]
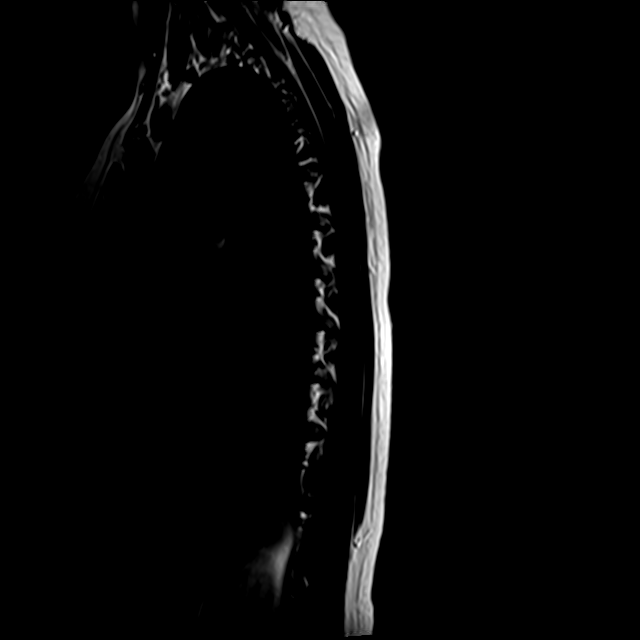

[Series 7: T2 · axial · 4.0mm · 0.39mm/px · z∈[-296,-67]mm · 8 of 40 slices shown (2 of 3)]
[im 1/40]
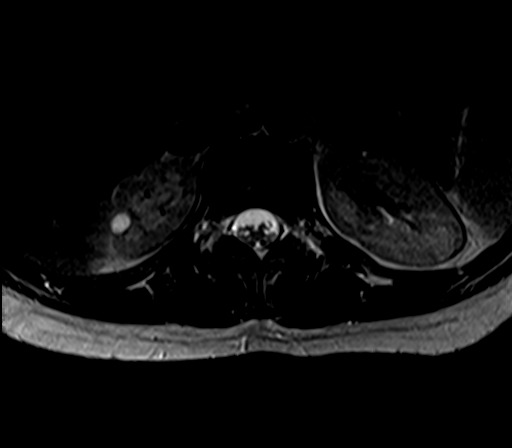
[im 7/40]
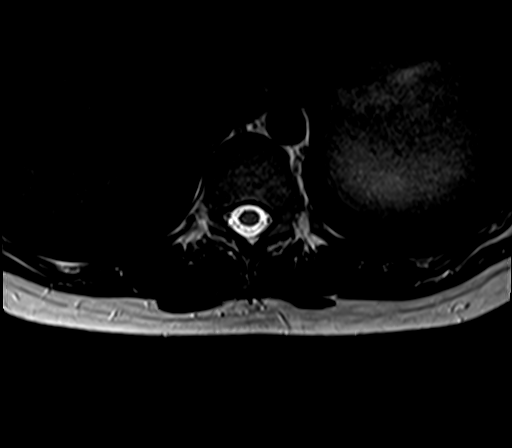
[im 13/40]
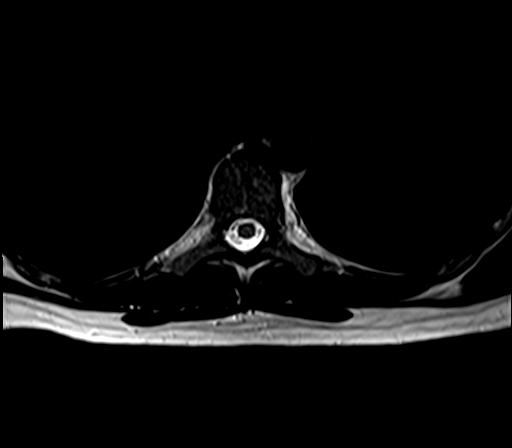
[im 19/40]
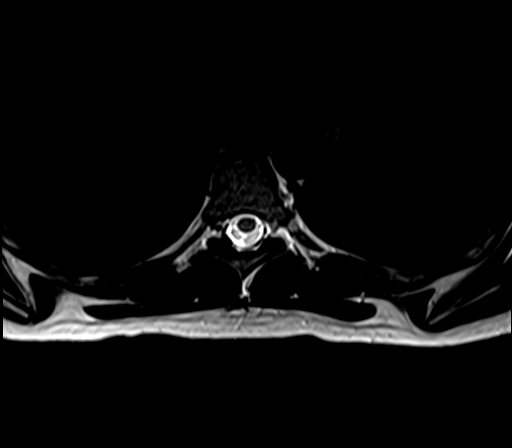
[im 22/40]
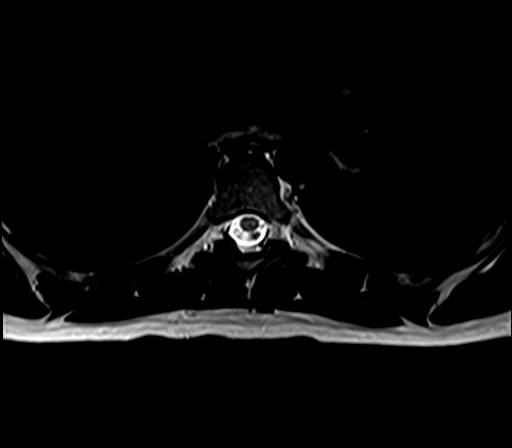
[im 28/40]
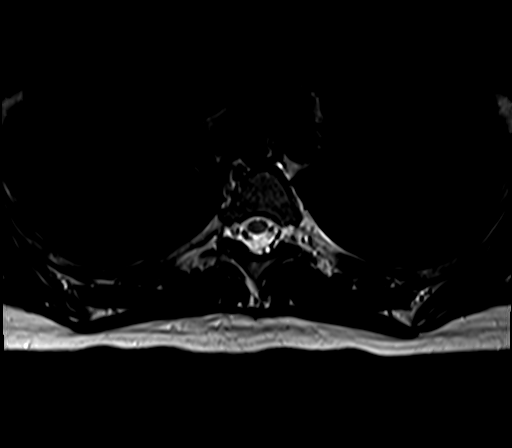
[im 34/40]
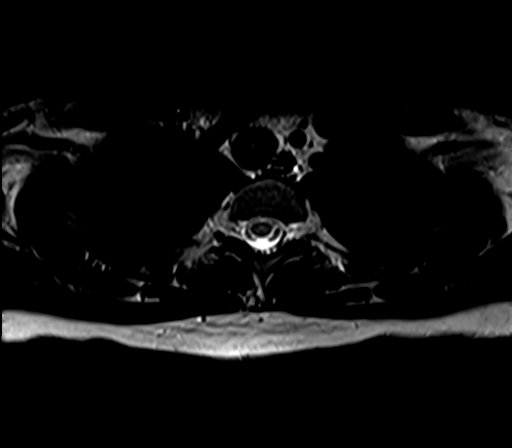
[im 40/40]
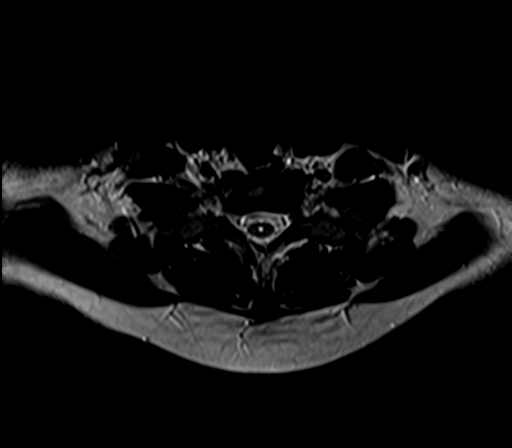

[Series 8: T2 · axial · 4.0mm · 0.39mm/px · z∈[-300,-92]mm · 4 of 40 slices shown (3 of 3)]
[im 1/40]
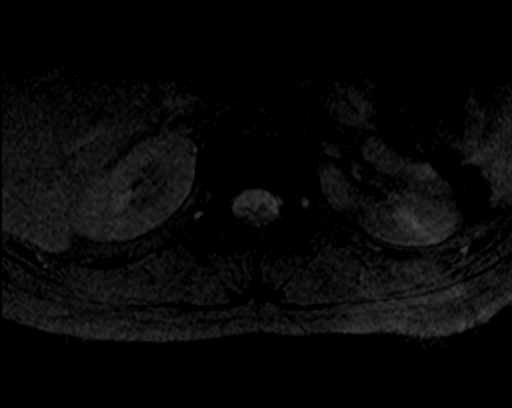
[im 7/40]
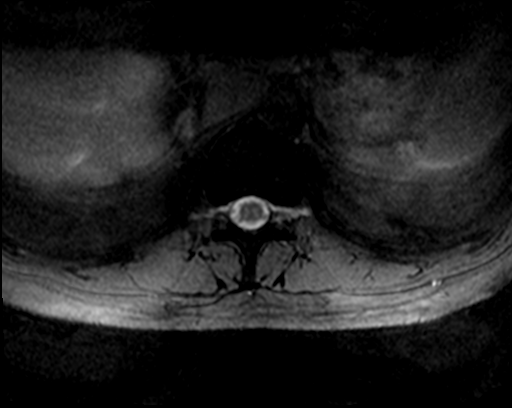
[im 22/40]
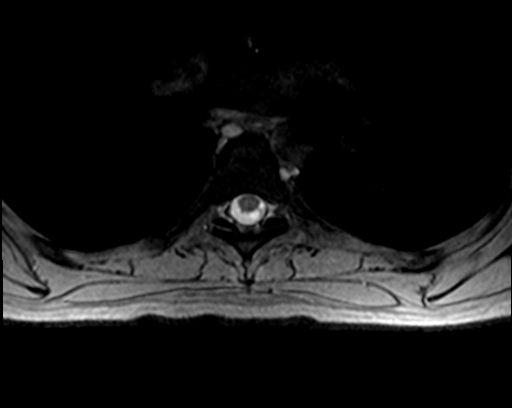
[im 34/40]
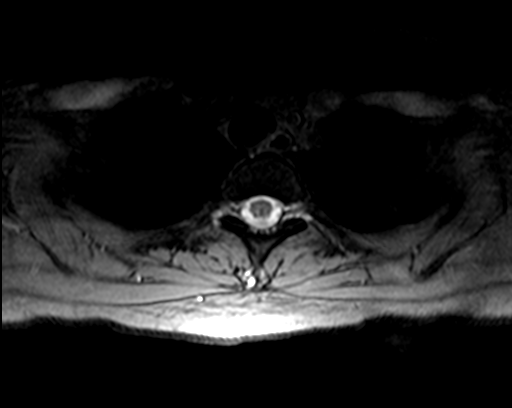

[20 of 48 positions shown; findings below may reference images not displayed]

FINDINGS: MRI THORACIC SPINE FINDINGS

Alignment:  Physiologic.

Vertebrae: No fracture, evidence of discitis, or bone lesion.

Cord: There is hydrosyringomyelia of the visualized lower cervical
spinal cord, which terminates at the C7-T1 level. The thoracic
spinal cord is normal.

Paraspinal and other soft tissues: Negative

Disc levels:

No spinal canal or neural foraminal stenosis.  No disc herniation.

MRI LUMBAR SPINE FINDINGS

Segmentation:  Standard.

Alignment:  Physiologic.

Vertebrae:  No fracture, evidence of discitis, or bone lesion.

Conus medullaris and cauda equina: Conus extends to the L1 level.
Conus and cauda equina appear normal.

Paraspinal and other soft tissues: Negative

Disc levels:

No spinal canal or neural foraminal stenosis.  No disc herniation.
IMPRESSION: 1. Normal MRI of the thoracic and lumbar spine.
2. Hydrosyringomyelia of the visualized lower cervical spinal cord,
which terminates at the C7-T1 level.

## 2020-12-16 IMAGING — MR MR LUMBAR SPINE W/O CM
4 of 5 series · 27 of 48 positions shown · non-contrast
Comparison: None.

CLINICAL DATA: Syringomyelia or syringobulbia.

EXAM:
MRI THORACIC AND LUMBAR SPINE WITHOUT CONTRAST
TECHNIQUE: Multiplanar and multiecho pulse sequences of the thoracic and lumbar
spine were obtained without intravenous contrast.

[Series 3: T2 · sagittal · 4.0mm · 1.09mm/px · 6 of 16 slices shown (1 of 2)]
[im 1/16]
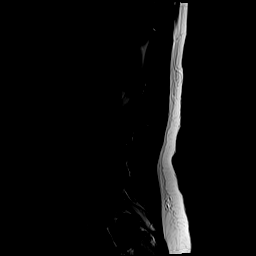
[im 4/16]
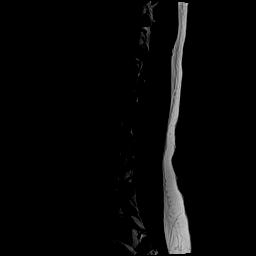
[im 7/16]
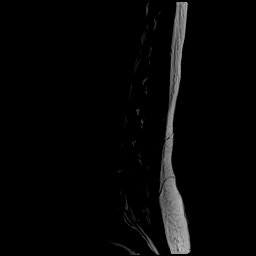
[im 10/16]
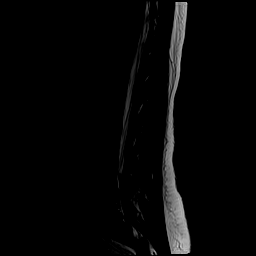
[im 13/16]
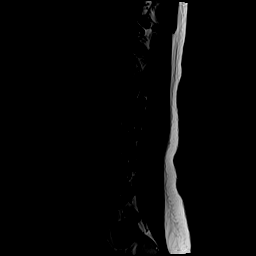
[im 16/16]
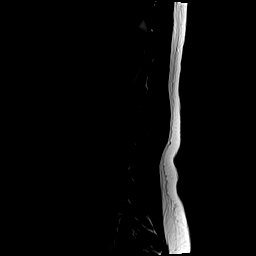

[Series 5: T1 · sagittal · 4.0mm · 1.09mm/px · 6 of 16 slices shown (1 of 2)]
[im 1/16]
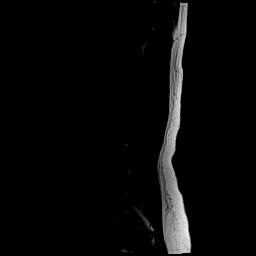
[im 4/16]
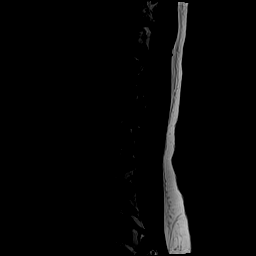
[im 7/16]
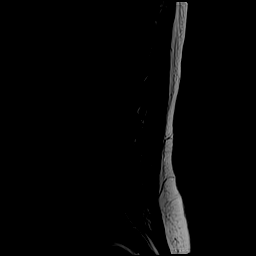
[im 10/16]
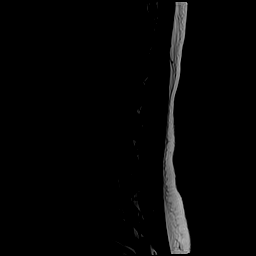
[im 13/16]
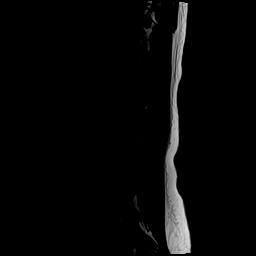
[im 16/16]
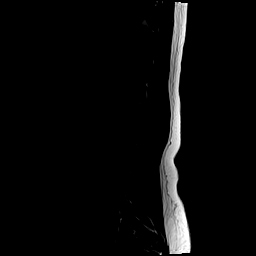

[Series 6: T2 · axial · 4.0mm · 0.39mm/px · z∈[-510,-282]mm · 9 of 41 slices shown (2 of 2)]
[im 1/41]
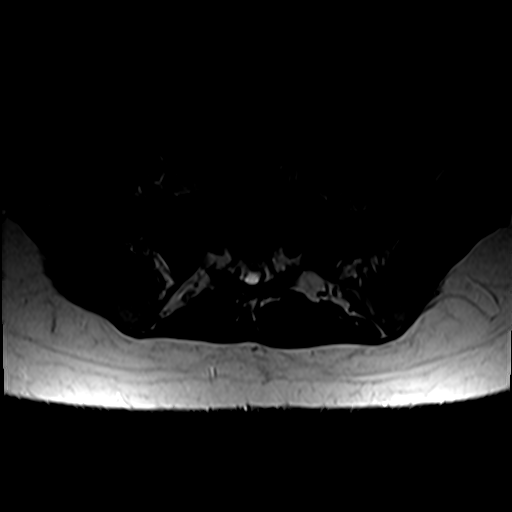
[im 6/41]
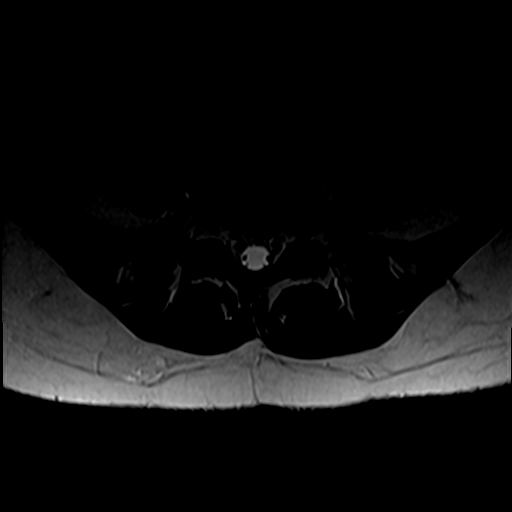
[im 12/41]
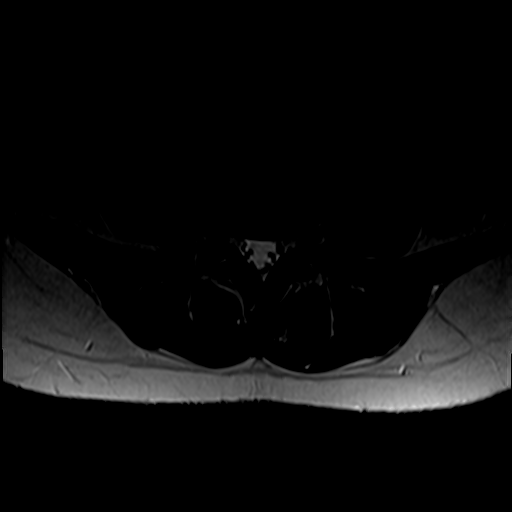
[im 18/41]
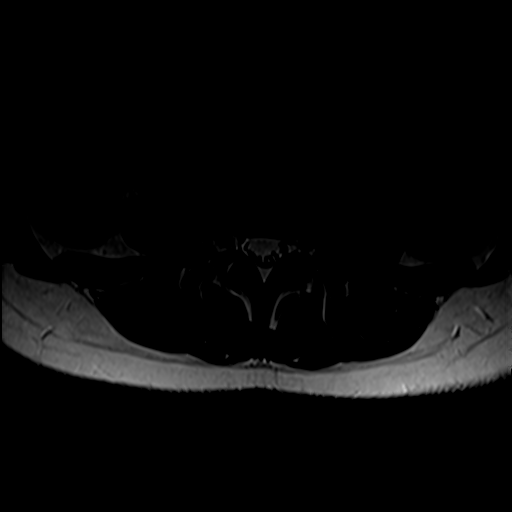
[im 21/41]
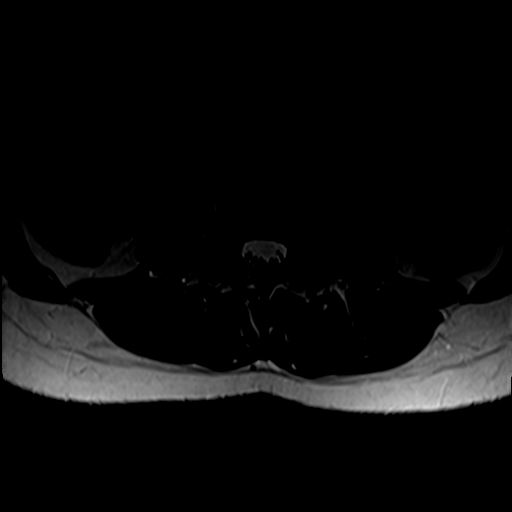
[im 23/41]
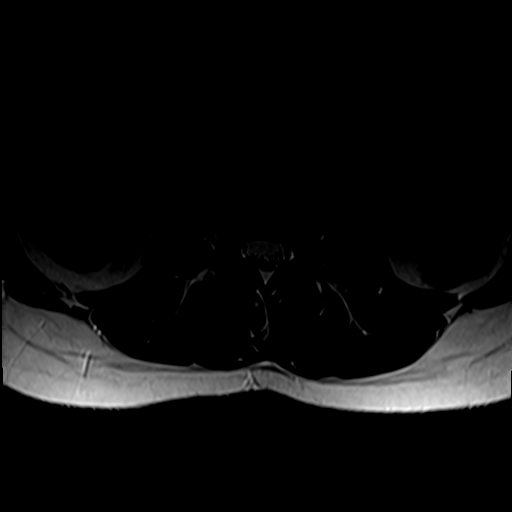
[im 29/41]
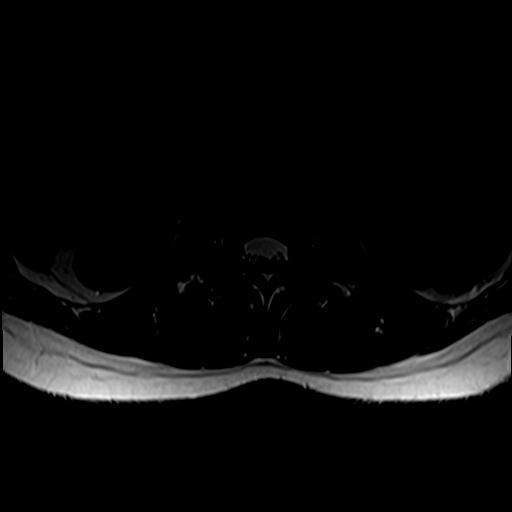
[im 35/41]
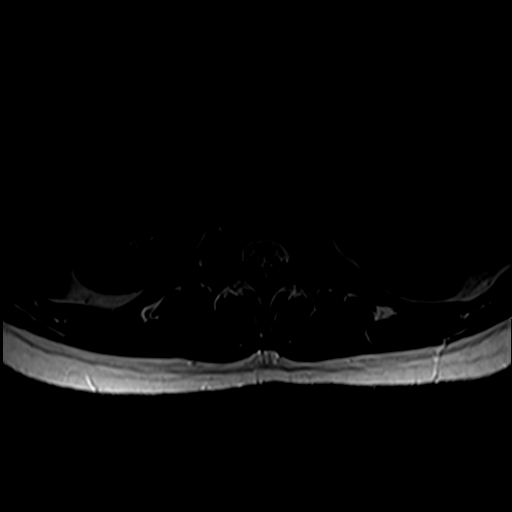
[im 41/41]
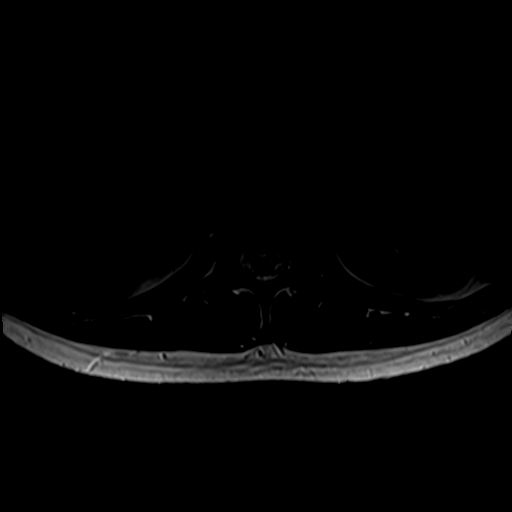

[Series 7: T1 · axial · 4.0mm · 0.39mm/px · z∈[-510,-310]mm · 6 of 41 slices shown (2 of 2)]
[im 1/41]
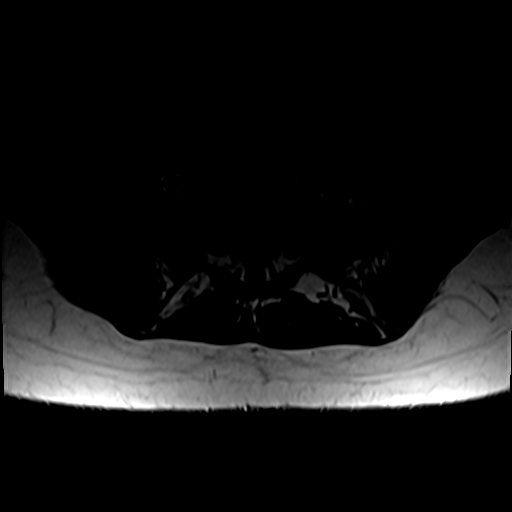
[im 6/41]
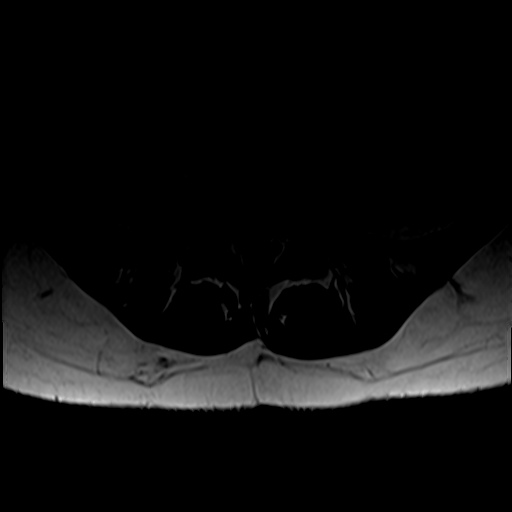
[im 12/41]
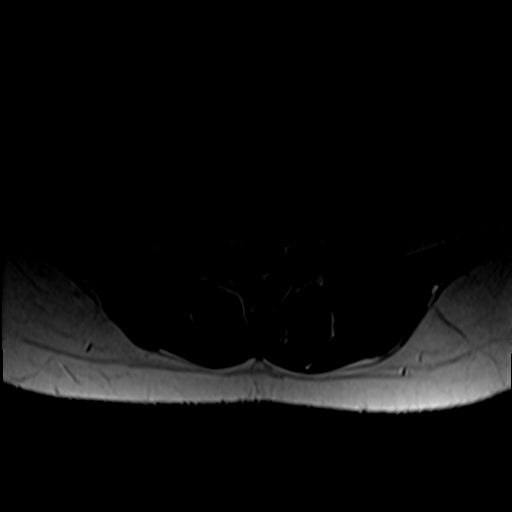
[im 18/41]
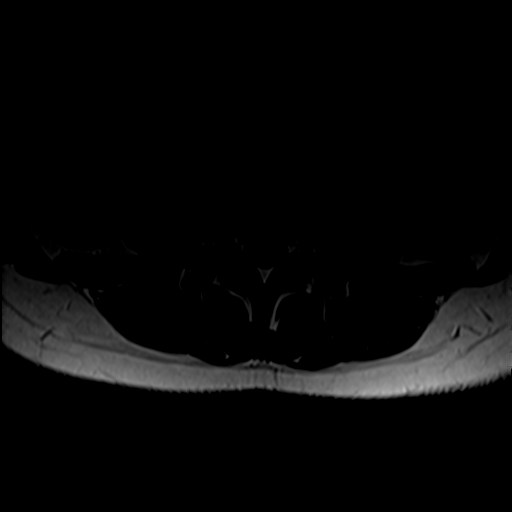
[im 21/41]
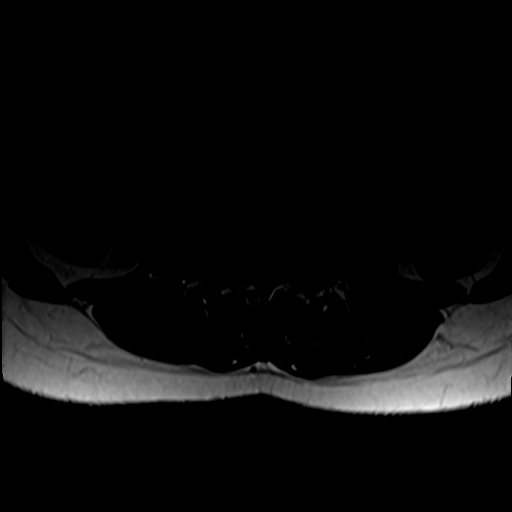
[im 35/41]
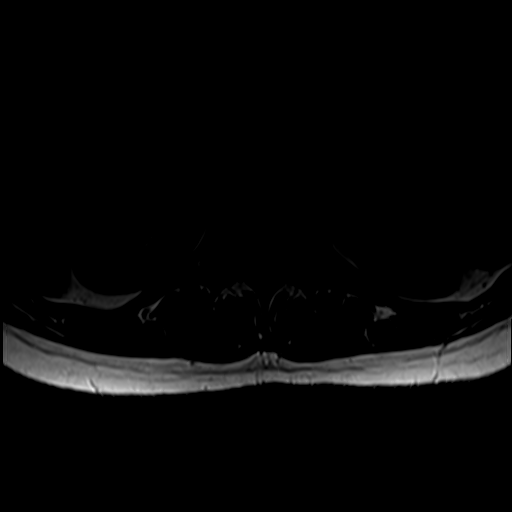

[27 of 48 positions shown; findings below may reference images not displayed]

FINDINGS: MRI THORACIC SPINE FINDINGS

Alignment:  Physiologic.

Vertebrae: No fracture, evidence of discitis, or bone lesion.

Cord: There is hydrosyringomyelia of the visualized lower cervical
spinal cord, which terminates at the C7-T1 level. The thoracic
spinal cord is normal.

Paraspinal and other soft tissues: Negative

Disc levels:

No spinal canal or neural foraminal stenosis.  No disc herniation.

MRI LUMBAR SPINE FINDINGS

Segmentation:  Standard.

Alignment:  Physiologic.

Vertebrae:  No fracture, evidence of discitis, or bone lesion.

Conus medullaris and cauda equina: Conus extends to the L1 level.
Conus and cauda equina appear normal.

Paraspinal and other soft tissues: Negative

Disc levels:

No spinal canal or neural foraminal stenosis.  No disc herniation.
IMPRESSION: 1. Normal MRI of the thoracic and lumbar spine.
2. Hydrosyringomyelia of the visualized lower cervical spinal cord,
which terminates at the C7-T1 level.

## 2020-12-18 ENCOUNTER — Ambulatory Visit
Admission: RE | Admit: 2020-12-18 | Discharge: 2020-12-18 | Disposition: A | Discharge status: Home / Self Care | Payer: PRIVATE HEALTH INSURANCE | Source: Ambulatory Visit | Attending: Pediatrics | Admitting: Pediatrics

## 2020-12-18 ENCOUNTER — Other Ambulatory Visit: Payer: Self-pay

## 2020-12-18 DIAGNOSIS — G950 Syringomyelia and syringobulbia: Secondary | ICD-10-CM

## 2020-12-18 DIAGNOSIS — G935 Compression of brain: Secondary | ICD-10-CM

## 2020-12-18 DIAGNOSIS — E8840 Mitochondrial metabolism disorder, unspecified: Secondary | ICD-10-CM

## 2020-12-18 DIAGNOSIS — Q068 Other specified congenital malformations of spinal cord: Secondary | ICD-10-CM

## 2020-12-18 DIAGNOSIS — G959 Disease of spinal cord, unspecified: Secondary | ICD-10-CM

## 2020-12-18 IMAGING — MR MR CERVICAL SPINE W/O CM
5 series · 34 of 48 positions shown · non-contrast
Comparison: None.

CLINICAL DATA: Chiari I malformation.  Syringomyelia.

EXAM:
MRI CERVICAL SPINE WITHOUT CONTRAST
TECHNIQUE: Multiplanar, multisequence MR imaging of the cervical spine was
performed. No intravenous contrast was administered.

[Series 2: T2 · sagittal · 3.0mm · 0.41mm/px · 6 of 17 slices shown (1 of 2)]
[im 1/17]
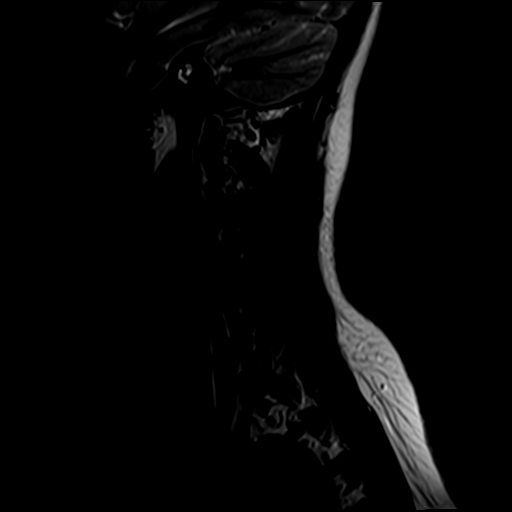
[im 4/17]
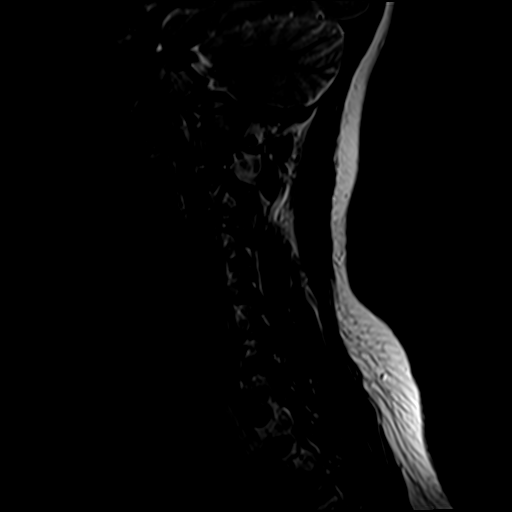
[im 7/17]
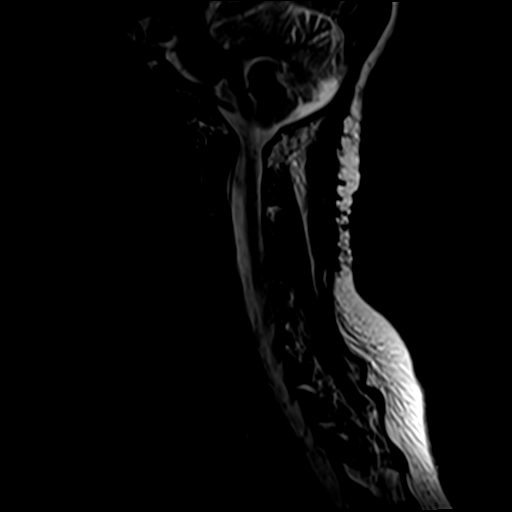
[im 10/17]
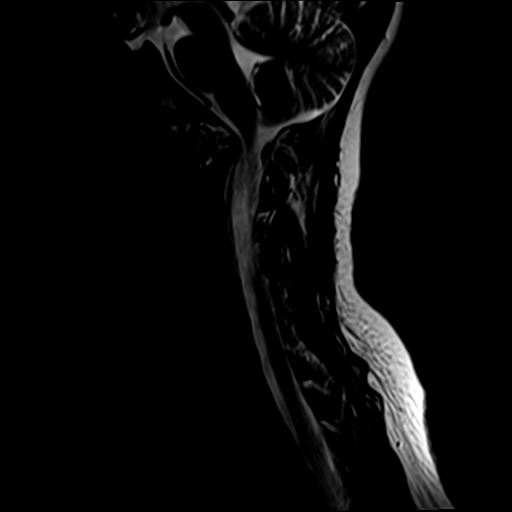
[im 13/17]
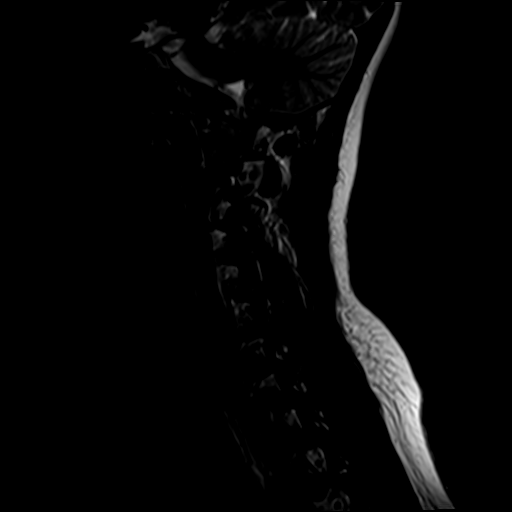
[im 17/17]
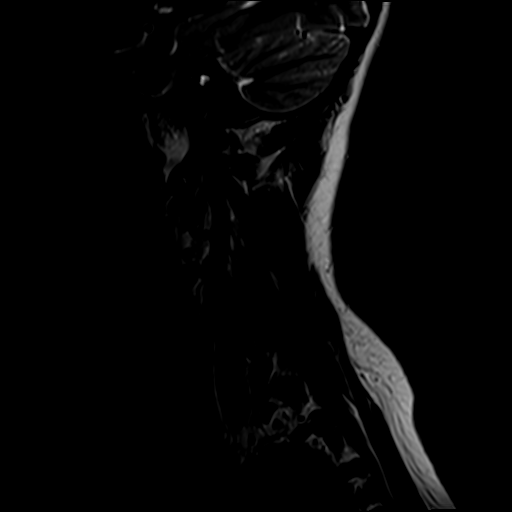

[Series 3: STIR · sagittal · 3.0mm · 0.86mm/px · 6 of 17 slices shown]
[im 1/17]
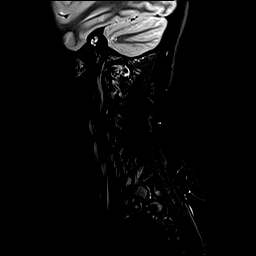
[im 4/17]
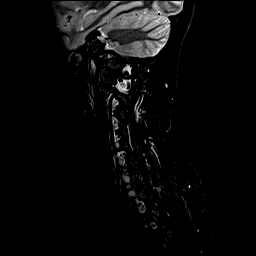
[im 7/17]
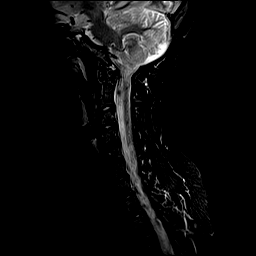
[im 10/17]
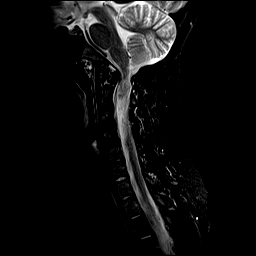
[im 13/17]
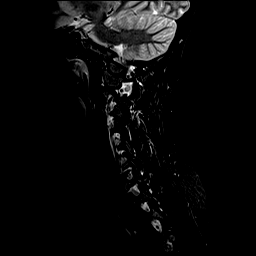
[im 17/17]
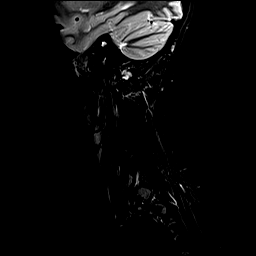

[Series 4: T1 · sagittal · 3.0mm · 0.82mm/px · 6 of 17 slices shown]
[im 1/17]
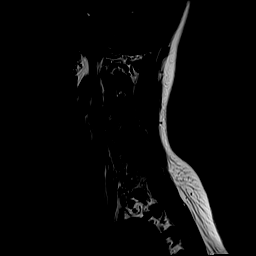
[im 4/17]
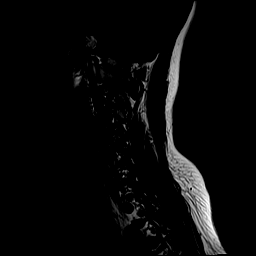
[im 7/17]
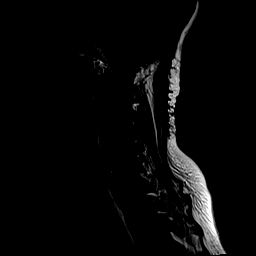
[im 10/17]
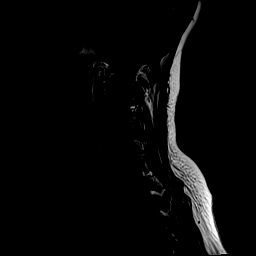
[im 13/17]
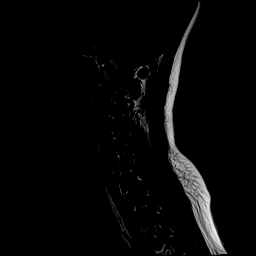
[im 17/17]
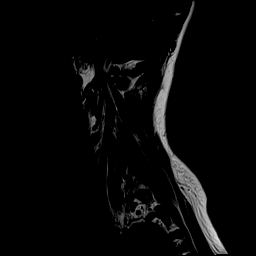

[Series 5: T2 · axial · 3.0mm · 0.78mm/px · z∈[-213,-68]mm · 9 of 40 slices shown (2 of 2)]
[im 1/40]
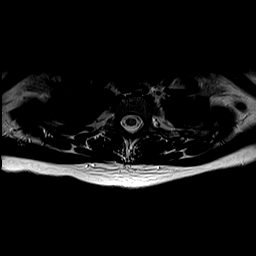
[im 6/40]
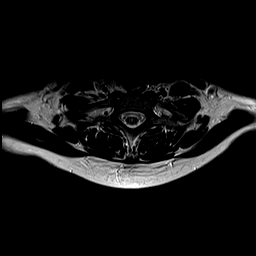
[im 12/40]
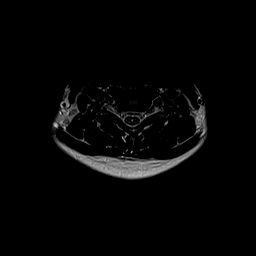
[im 17/40]
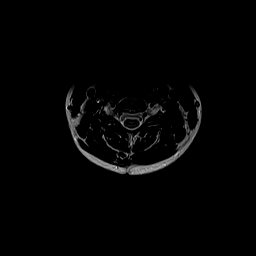
[im 20/40]
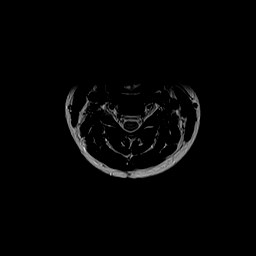
[im 23/40]
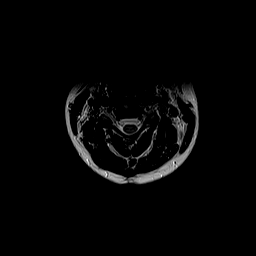
[im 28/40]
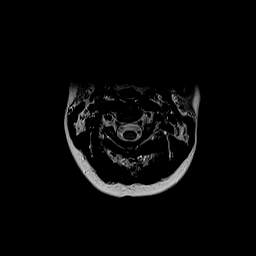
[im 34/40]
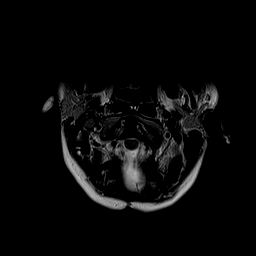
[im 40/40]
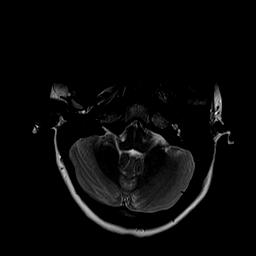

[Series 6: GRE · axial · 3.0mm · 0.39mm/px · z∈[-213,-90]mm · 7 of 40 slices shown]
[im 1/40]
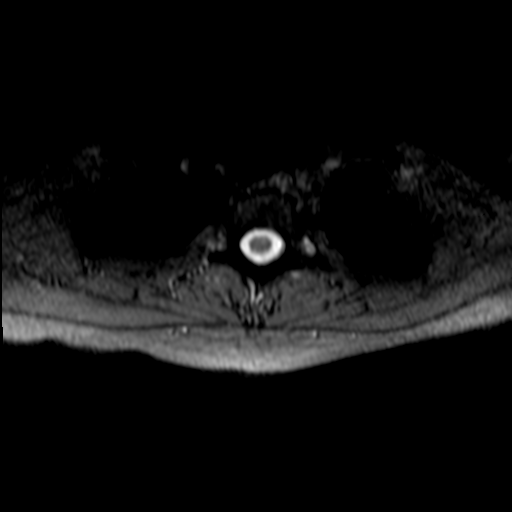
[im 6/40]
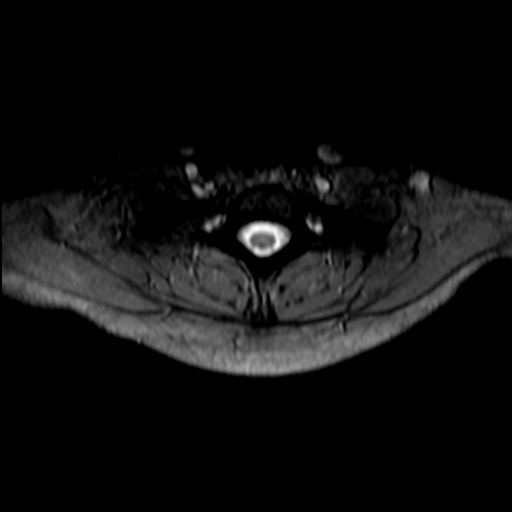
[im 12/40]
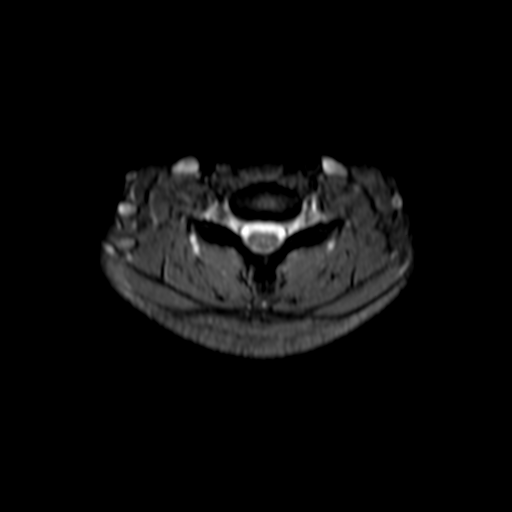
[im 17/40]
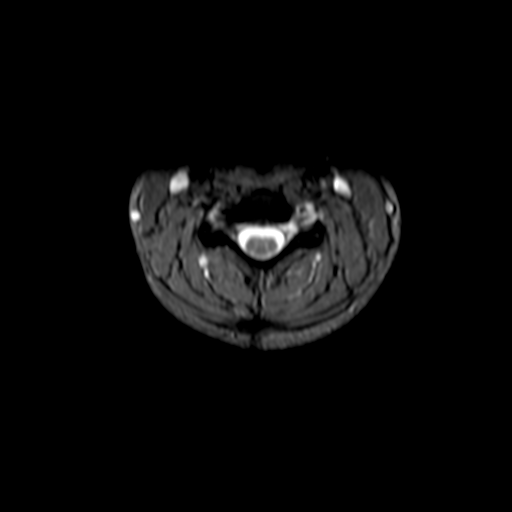
[im 23/40]
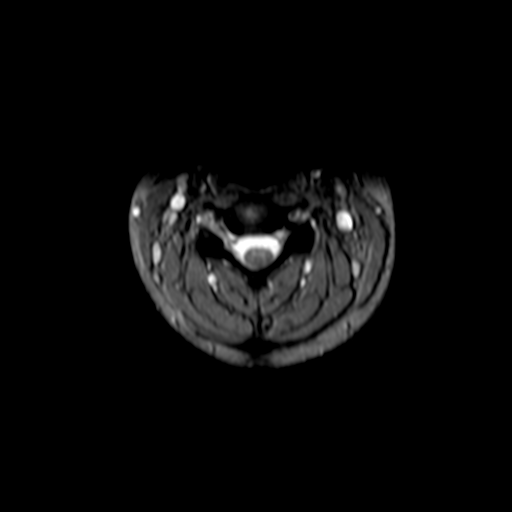
[im 28/40]
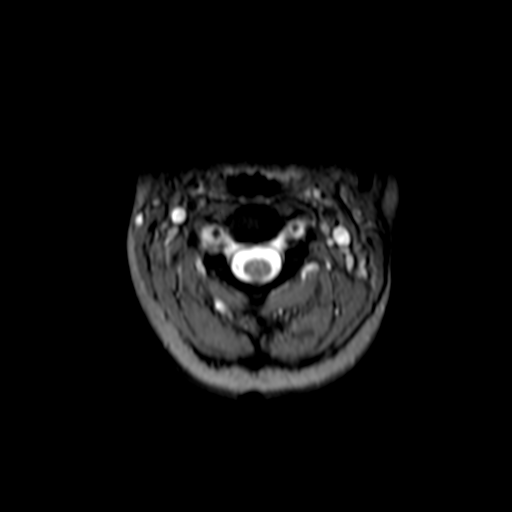
[im 34/40]
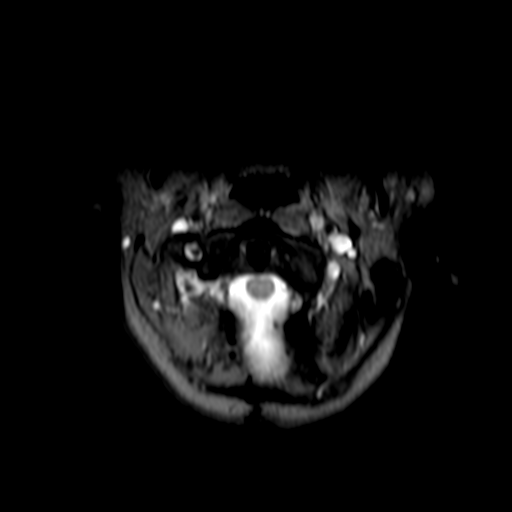

[34 of 48 positions shown; findings below may reference images not displayed]

FINDINGS: Alignment: Normal.

Vertebrae: No fracture, suspicious marrow lesion, or significant
marrow edema. Suboccipital craniectomy and C1 laminectomy.

Cord: Syrinx extending from C6-T1 with maximal diameter of 2.5 mm.

Posterior Fossa, vertebral arteries, paraspinal tissues:
Unremarkable.

Disc levels:

Cervical disc space heights are preserved. No disc herniation is
identified, and the spinal canal and neural foramina are widely
patent.
IMPRESSION: 1. Small syrinx in the lower cervical spinal cord.
2. Prior Chiari decompression.

## 2020-12-18 IMAGING — MR MR HEAD W/O CM
10 series · 48 of 48 positions shown · non-contrast
Comparison: Head CT [DATE]

CLINICAL DATA: Chiari I malformation.  Syringomyelia.

EXAM:
MRI HEAD WITHOUT CONTRAST
TECHNIQUE: Multiplanar, multiecho pulse sequences of the brain and surrounding
structures were obtained without intravenous contrast.

[Series 2: T1 · sagittal · 5.0mm · 0.45mm/px · 2 of 21 slices shown]
[im 1/21]
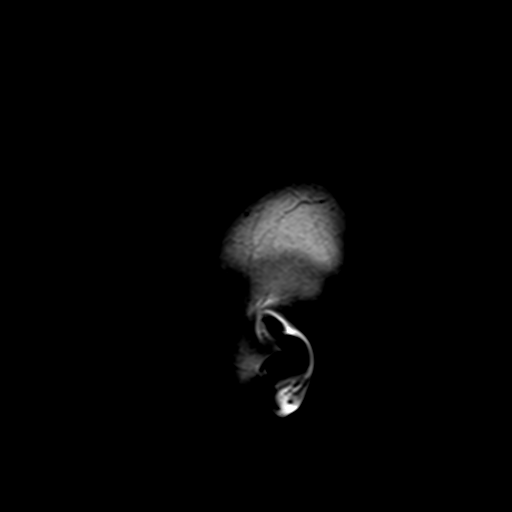
[im 21/21]
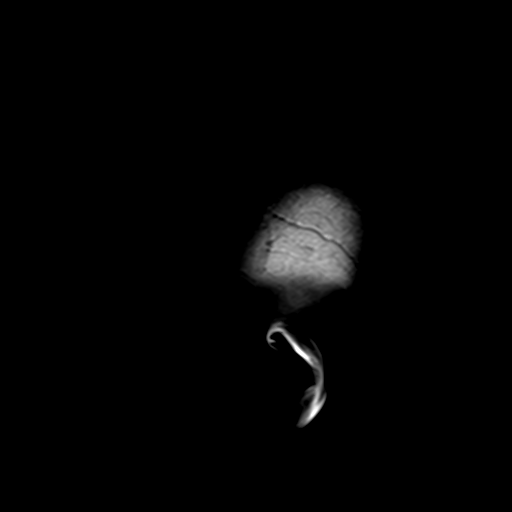

[Series 3: DWI · axial · 3.0mm · 1.80mm/px · z∈[-79,+67]mm · 8 of 100 slices shown (1 of 4)]
[im 1/100]
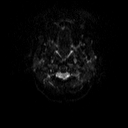
[im 15/100]
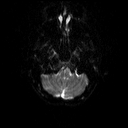
[im 29/100]
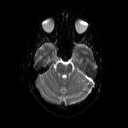
[im 43/100]
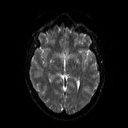
[im 57/100]
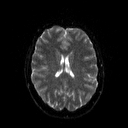
[im 71/100]
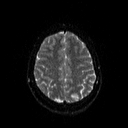
[im 85/100]
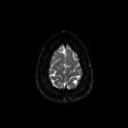
[im 100/100]
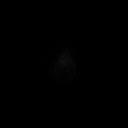

[Series 4: DWI · axial · 3.0mm · 1.80mm/px · z∈[-79,+67]mm · 4 of 50 slices shown (2 of 4)]
[im 1/50]
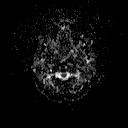
[im 17/50]
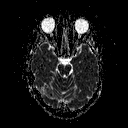
[im 33/50]
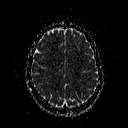
[im 50/50]
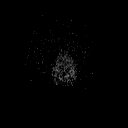

[Series 5: DWI · coronal · 5.0mm · 1.80mm/px · 6 of 69 slices shown (3 of 4)]
[im 1/69]
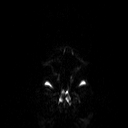
[im 14/69]
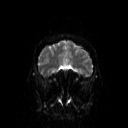
[im 28/69]
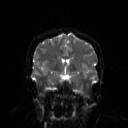
[im 41/69]
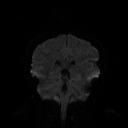
[im 55/69]
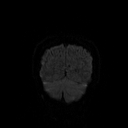
[im 69/69]
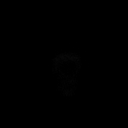

[Series 6: DWI · coronal · 5.0mm · 1.80mm/px · 3 of 35 slices shown (4 of 4)]
[im 1/35]
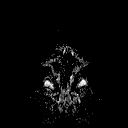
[im 18/35]
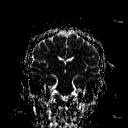
[im 35/35]
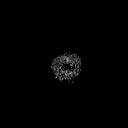

[Series 7: T2 · axial · 5.0mm · 0.51mm/px · z∈[-92,+68]mm · 2 of 24 slices shown (1 of 2)]
[im 1/24]
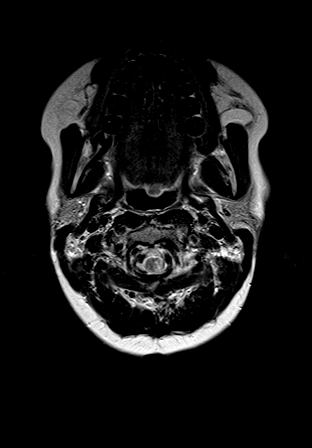
[im 24/24]
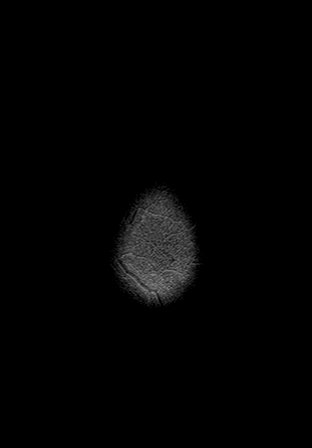

[Series 8: FLAIR · axial · 3.0mm · 0.45mm/px · z∈[-90,+67]mm · 3 of 35 slices shown]
[im 1/35]
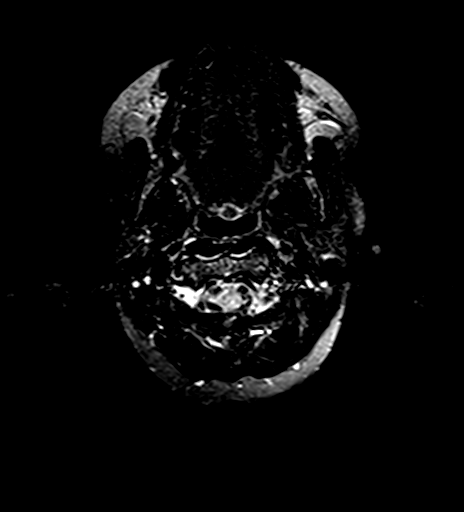
[im 18/35]
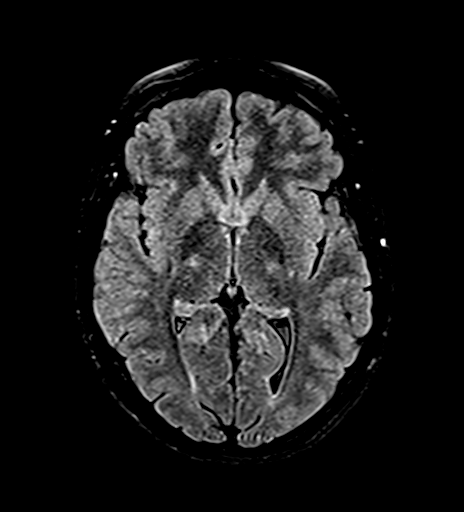
[im 35/35]
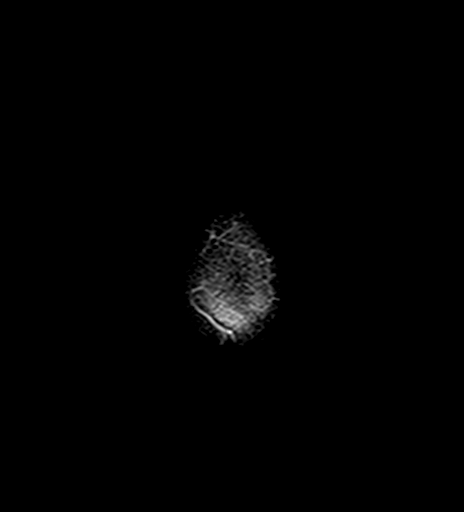

[Series 10: swi_images · axial · 4.0mm · 0.90mm/px · z∈[-89,+66]mm · 3 of 40 slices shown]
[im 1/40]
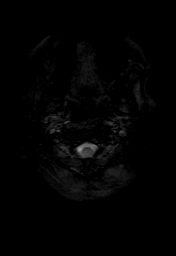
[im 20/40]
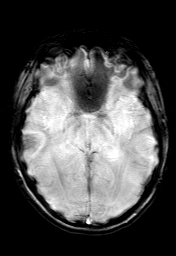
[im 40/40]
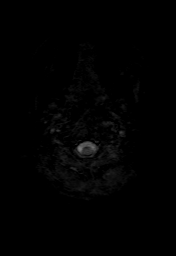

[Series 11: t1_mpr_tra · axial · 1.0mm · 0.71mm/px · z∈[-97,+76]mm · 15 of 176 slices shown]
[im 1/176]
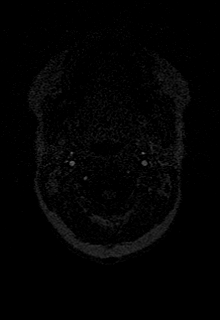
[im 13/176]
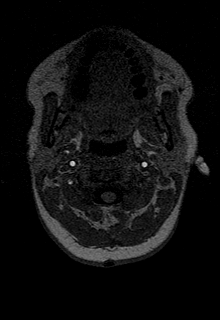
[im 26/176]
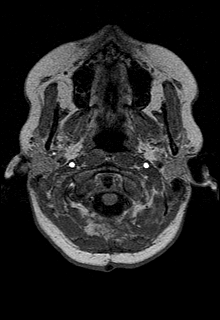
[im 38/176]
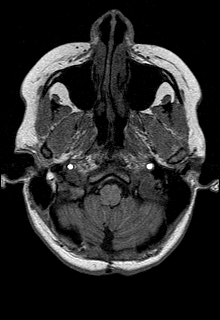
[im 51/176]
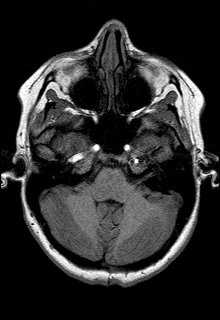
[im 63/176]
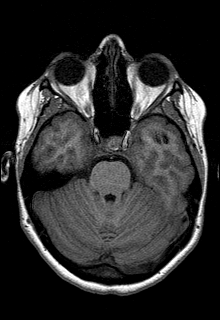
[im 76/176]
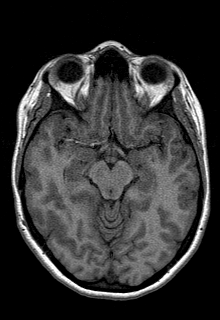
[im 88/176]
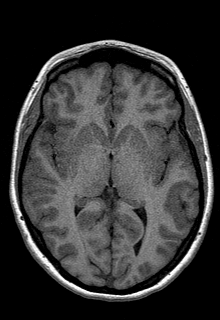
[im 101/176]
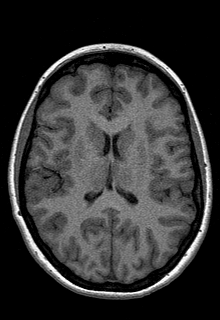
[im 113/176]
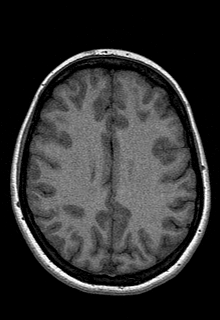
[im 126/176]
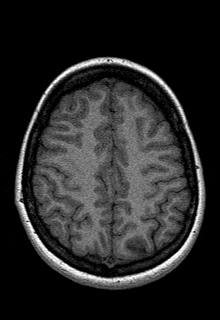
[im 138/176]
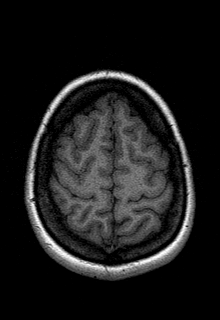
[im 151/176]
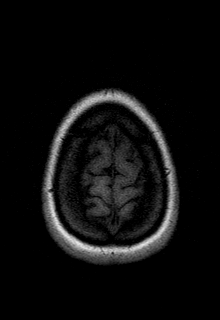
[im 163/176]
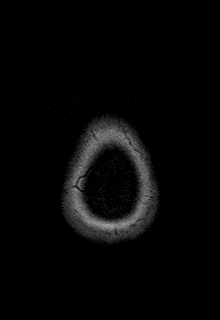
[im 176/176]
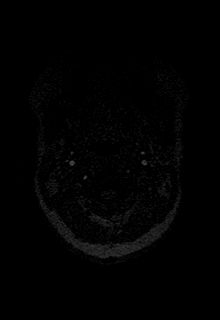

[Series 12: T2 · coronal · 5.0mm · 0.45mm/px · 2 of 25 slices shown (2 of 2)]
[im 1/25]
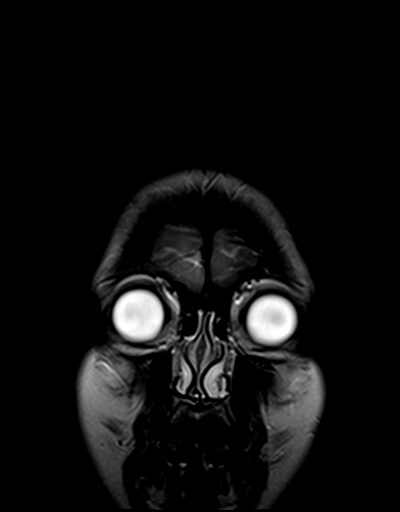
[im 25/25]
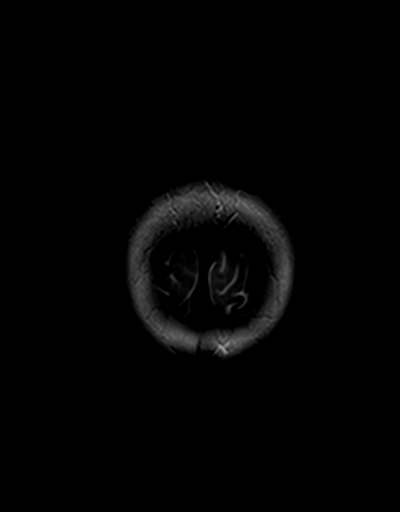

[48 of 48 positions shown; findings below may reference images not displayed]

FINDINGS: Brain: There is no evidence of an acute infarct, intracranial
hemorrhage, mass, midline shift, or extra-axial fluid collection.
The ventricles and sulci are normal. The brain is normal in signal.

Vascular: Major intracranial vascular flow voids are preserved.

Skull and upper cervical spine: Suboccipital craniectomy.
Unremarkable bone marrow signal. Cervical spine reported separately.

Sinuses/Orbits: Unremarkable orbits. Paranasal sinuses and mastoid
air cells are clear.

Other: None.
IMPRESSION: Unremarkable appearance of the brain following Chiari I
decompression.

## 2020-12-20 ENCOUNTER — Telehealth (INDEPENDENT_AMBULATORY_CARE_PROVIDER_SITE_OTHER): Payer: Self-pay | Admitting: Pediatrics

## 2020-12-20 DIAGNOSIS — G950 Syringomyelia and syringobulbia: Secondary | ICD-10-CM

## 2020-12-20 DIAGNOSIS — E8840 Mitochondrial metabolism disorder, unspecified: Secondary | ICD-10-CM

## 2020-12-20 DIAGNOSIS — G935 Compression of brain: Secondary | ICD-10-CM

## 2020-12-20 LAB — CBC WITH DIFFERENTIAL/PLATELET
Absolute Monocytes: 410 cells/uL (ref 200–950)
Basophils Absolute: 82 cells/uL (ref 0–200)
Basophils Relative: 1.3 %
Eosinophils Absolute: 611 cells/uL — ABNORMAL HIGH (ref 15–500)
Eosinophils Relative: 9.7 %
HCT: 46.7 % — ABNORMAL HIGH (ref 35.0–45.0)
Hemoglobin: 15 g/dL (ref 11.7–15.5)
Lymphs Abs: 2419 cells/uL (ref 850–3900)
MCH: 29 pg (ref 27.0–33.0)
MCHC: 32.1 g/dL (ref 32.0–36.0)
MCV: 90.2 fL (ref 80.0–100.0)
MPV: 11.1 fL (ref 7.5–12.5)
Monocytes Relative: 6.5 %
Neutro Abs: 2778 cells/uL (ref 1500–7800)
Neutrophils Relative %: 44.1 %
Platelets: 358 10*3/uL (ref 140–400)
RBC: 5.18 10*6/uL — ABNORMAL HIGH (ref 3.80–5.10)
RDW: 12.3 % (ref 11.0–15.0)
Total Lymphocyte: 38.4 %
WBC: 6.3 10*3/uL (ref 3.8–10.8)

## 2020-12-20 LAB — T4, FREE: Free T4: 1.5 ng/dL (ref 0.8–1.8)

## 2020-12-20 LAB — COMPREHENSIVE METABOLIC PANEL
AG Ratio: 1.5 (calc) (ref 1.0–2.5)
ALT: 13 U/L (ref 6–29)
AST: 26 U/L (ref 10–30)
Albumin: 4.3 g/dL (ref 3.6–5.1)
Alkaline phosphatase (APISO): 64 U/L (ref 31–125)
BUN: 8 mg/dL (ref 7–25)
CO2: 12 mmol/L — ABNORMAL LOW (ref 20–32)
Calcium: 9.7 mg/dL (ref 8.6–10.2)
Chloride: 117 mmol/L — ABNORMAL HIGH (ref 98–110)
Creat: 0.84 mg/dL (ref 0.50–0.96)
Globulin: 2.8 g/dL (calc) (ref 1.9–3.7)
Glucose, Bld: 81 mg/dL (ref 65–139)
Potassium: 4.9 mmol/L (ref 3.5–5.3)
Sodium: 144 mmol/L (ref 135–146)
Total Bilirubin: 0.5 mg/dL (ref 0.2–1.2)
Total Protein: 7.1 g/dL (ref 6.1–8.1)

## 2020-12-20 LAB — T3, FREE: T3, Free: 3.3 pg/mL (ref 2.3–4.2)

## 2020-12-20 LAB — C-PEPTIDE: C-Peptide: 1.98 ng/mL (ref 0.80–3.85)

## 2020-12-20 LAB — IRON: Iron: 146 ug/dL (ref 40–190)

## 2020-12-20 LAB — TSH: TSH: 0.6 mIU/L

## 2020-12-20 NOTE — Telephone Encounter (Signed)
I called home number on chart, went straight to voicemail.  I left PMI safe message that imaging looked fine.  Asked that mother please reach out to Laura Parrish to discuss further.   I compared this imaging to prior, and her syrinx actually looks better and the chiari looks unchanged.  The good news is that this means there is not a neurosurgical cause for her back pain.  I can't say for certain since I haven't examined Laura Parrish, but I suspect that this is musculoskeletal given the unchanged imaging.  I can refer to sports medicine to evaluate her, or we can try physical therapy first to see if that improved things.    I have included her PCP on this message, and I will inform her Gyn of this as well, since she will be seeing her next week.  Per Epic, Laura Parrish has already seen the images as well.    [name] MD MPH

## 2020-12-25 NOTE — Addendum Note (Signed)
Addended by: Princella Ion on: 12/25/2020 05:10 PM   Modules accepted: Orders

## 2020-12-25 NOTE — Telephone Encounter (Signed)
Late documentation: I called family to discuss results directly, left message.  See telephone note for more details.   [name] MD MPH

## 2020-12-25 NOTE — Telephone Encounter (Signed)
Mom called regarding Dr Blair Heys phone message last week. While she is relieved that there is not an obvious neurosurgical problem that needs to be addressed, she wants a referral to Dr Kennedy Bucker at [facility] for another opinion. She is open to a rheumatology referral but wants to see someone at a tertiary center. I told Mom that I will put in the referral for Dr Kennedy Bucker and will discuss the rheumatology referral with Dr Artis Flock on Thursday when she returns to the office. Mom agreed with this plan. TG

## 2020-12-27 ENCOUNTER — Encounter (INDEPENDENT_AMBULATORY_CARE_PROVIDER_SITE_OTHER): Payer: Self-pay | Admitting: "Endocrinology

## 2020-12-27 ENCOUNTER — Ambulatory Visit (INDEPENDENT_AMBULATORY_CARE_PROVIDER_SITE_OTHER): Payer: PRIVATE HEALTH INSURANCE | Admitting: "Endocrinology

## 2020-12-27 ENCOUNTER — Other Ambulatory Visit: Payer: Self-pay

## 2020-12-27 VITALS — BP 118/80 | HR 76 | Wt 124.1 lb

## 2020-12-27 DIAGNOSIS — E221 Hyperprolactinemia: Secondary | ICD-10-CM

## 2020-12-27 DIAGNOSIS — E039 Hypothyroidism, unspecified: Secondary | ICD-10-CM

## 2020-12-27 DIAGNOSIS — R5383 Other fatigue: Secondary | ICD-10-CM

## 2020-12-27 DIAGNOSIS — E063 Autoimmune thyroiditis: Secondary | ICD-10-CM

## 2020-12-27 DIAGNOSIS — R1013 Epigastric pain: Secondary | ICD-10-CM

## 2020-12-27 DIAGNOSIS — E049 Nontoxic goiter, unspecified: Secondary | ICD-10-CM

## 2020-12-27 DIAGNOSIS — M6281 Muscle weakness (generalized): Secondary | ICD-10-CM

## 2020-12-27 DIAGNOSIS — R110 Nausea: Secondary | ICD-10-CM

## 2020-12-27 DIAGNOSIS — R42 Dizziness and giddiness: Secondary | ICD-10-CM

## 2020-12-27 DIAGNOSIS — E162 Hypoglycemia, unspecified: Secondary | ICD-10-CM

## 2020-12-27 NOTE — Patient Instructions (Signed)
Follow up visit in 4 months. Please repeat lab tests 1-2 weeks prior. 

## 2020-12-27 NOTE — Telephone Encounter (Signed)
Referral faxed to Dr. Hal Hope office. Documented in referral.

## 2020-12-27 NOTE — Progress Notes (Signed)
Subjective:  Subjective  Patient Name: Laura Parrish Date of Birth: 26-Aug-2000  MRN: [id number]  Laura Parrish  presents for her clinic visit today for follow up evaluation and management of her acquired hypothyroidism due to Hashimoto's thyroiditis, goiter, chronic fatigue, dyspepsia, GERD, and many other health problems.   HISTORY OF PRESENT ILLNESS:   Laura Parrish is a 20 y.o. Caucasian young woman.   Tyah was accompanied by her mother.  1. Chontel's initial pediatric endocrine clinic visit here at [facility] occurred on 01/29/18. The following information is a composite of the history gained at her initial visit, information gained after 7 hours of reviewing her records in [place], and information gleaned on 02/05/18:   A. Inis has had a very complicated medical and surgical history. She has been hospitalized many times during her life. Her problem list includes the following:    1). Chronic GI problems, with frequent constipation, frequent diarrhea, BMs occurring immediately after eating, nausea, abdominal pains, and feeding difficulties requiring the use of both oral and nocturnal G-tube feedings: Previous EGD and colonoscopy have been unremarkable. TTG IgA was negative on 12/29/17.    2). Chronic allergies, including seasonal allergies, allergies to other environmental agents, latex, Propofol, fish and shellfish according to rashes but not to testing, and Alpha-Gal allergy with a positive result, but not specifically beef, pork, or lamb;   3). Chronic fatigue   4). Iron deficiency anemia   5). Hypothyroidism with elevated TPO and thyroglobulin antibodies, c/w Hashimoto's thyroiditis;       6). Muscle weakness and myalgias   7). 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 Family was offered treatment with Florinef, but declined because their pharmacist advised against starting Florinef due to possible adverse effects.     8).  Intermittent dehydration requiring administration of iv fluids via her port-a-cath every 6 weeks, but more recently every 2 weeks   9). Chronic headaches, to include migraines             10). 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 Chronic swelling of her hands and feet   13). Chronic pain of her back, joints, legs, and feet   14). Skin rashes, hives, eczema, and flushing   15). Irregular menses and menorrhagia   16). Nocturnal hypoglycemia if she does not have nocturnal pump feedings.    17). Chronic illness with low grade fevers, sometimes up to 101, but mostly 99-100 degrees   18). 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 However, her testing did not support that hypothesis. Mom has the same genetic variant, but not all of the same problems.   B. Perinatal history: Gestational Age: [redacted]w[redacted]d; 5 lb 7 oz (2.466 kg); Healthy newborn, except noted to have a right club foot deformity.    C. Infancy: She had to be hospitalized several times for unexplained dehydration. She had club foot surgery at about 59 weeks of age.   D. Childhood:   1). She was sick a lot.    2). She had PT for her club foot.    3). A Chiari malformation was discovered at age 53. She had decompression surgery  in 2007.    4). In 2008 she was discovered at [facility] to have a tethered cord. She had a release procedure in 2008.   5). In 2010 she had a muscle BX that showed mild variation in fiber size, more notable in Type I fibers with rare atrophic Type I fibers. EM showed that the normal sarcomeric architecture was preserved. Mitochondria were morphologically unremarkable. Dr. 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    6). In 2010 she continued to have severe problems with lack of appetite, nausea, and feeding difficulties. In late 2010 she had a G-tube placed.    7). Genetic testing was performed at [facility] on 09/02/08. Karyotype and chromosomal array were normal. 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 Mitochondrial DNA copy number in muscle tissue was normal, 123% of the mean value of the age and tissue tested controls.   8). On 10/18/12 Laura Parrish's TFTs were c/w primary hypothyroidism. Levothyroxine treatment was initiated.    9). She had a cholecystectomy in June 2015 for biliary hypokinesia.    10). A port-a-cath was placed in August 2018.    11). In December 2018 she saw a geneticist, Dr. Penni Bombard, in [place] for evaluation of a mitochondrial dysfunction. 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 Targeted GeneDX testing was performed in June-July 2019 and reportedly showed allergies in both [place] and her brother. Testing in Laura Parrish's brother also showed genetic tendencies for seizures and developmental delays.     E. Chief complaint:   1). Hypothyroidism was diagnosed on 18 October 2012 after several years of variable thyroid hormone test results. Her TSH was high 10.35 (ref 0.5-4.5) and free T4 was low at 0.67 (ref 0.80-2.0). TPO antibody and thyroglobulin antibody were both reportedly positive. She started thyroid hormone at about that time. She takes generic levothyroxine, 50 mcg/day. That dose has not been changed since it was started. She does not think that she has ever had thyroid swelling or pain. Mom said that Laura Parrish's TSH and free T4 values have been normal. However, her TSH was elevated again in February 2019.   2). Hypoglycemia: At about age 19 she had low BGs almost all the time. If she did not get G-tube feedings at  night, the BGs in the mornings could be in the 40s-50s. She had never been evaluated for adrenal insufficiency. If her BGs are low during the day she can get dizzy.   3). Dizziness: She has been dizzy for many years. The dizziness was originally ascribed to her Arnold-Chiari malformation, but did not improve after surgery. About 30% of her dizziness was associated with low BGs. Some of her dizziness was a spinning dizziness.  Some of her dizziness appears to be orthostatic dizziness. This latter dizziness occurs almost every morning when she sits up from a supine or prone position. The dizziness can also occur if she stands up too fast from a seated position. Whenever she gets dizzy for any reason, she often gets black spots in front of her eyes and feels like she will pass out.   4). Abnormal periods and excessive menstrual and intermenstrual bleeding: Menarche occurred at age 87. Periods have never been regular. When she took most OCPs she had persistent vaginal bleeding. Seasonale caused her to have menses every 3 months for about one year, then it became less effective. She continued to have heavy spotting. This problem has  been sometimes attributed to a connective tissue disorder.    5). 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 Her initial [facility] consultation occurred on 03/15/12.      1). The report of her 2009 muscle BX was reviewed. The genetics reports from [facility] done in July 2010 and from [place] in 2010 were reviewed. Also reviewed was a report from Dr. Salomon Fick of the [facility] who did not endorse support for a diagnosis of mitochondrial disease.      2). [facility] geneticist noted that none of [place]'s previous testing supported a diagnosis of a mitochondrial disorder. The geneticist also stated that no specific genetic disorders are known to cause her constellation of symptoms shared by her and her younger brother, including Chiari I  malformation. Genome sequencing testing, to determine if Laura Parrish and her brother had a unique gene mutation underlying their presentations, was offered to the family. Unfortunately the costs of that testing were too high for the family to agree to the testing     B). Her [facility] record note from 10/15/15 shows that Arlisha's previous tests for myopathy and mitochondrial function were all negative. Genetic whole exome testing was negative for a 20 y.o. female with Chiari I malformation, fatigue, muscle weakness, feeding difficulties, tethered cord, and clinical diagnosis of Ehlers-Danlos syndrome. GeneDX testing showed a variant in the mitochondrial genome of uncertain significance that was found in 200% of Laura Parrish's mitochondria and 100% of her mother's mitochondria. Brother presumably had the same variant. It was noted that since McGregor and Laura Parrish inherited the same change as the mother and at the same levels, it was unlikely that this variant was causing Laura Parrish's and Laura Parrish's symptoms, because the mother did not exhibit similar symptoms. [Addendum 02/05/18: Mom says that she did have some of the same symptoms that her children had, but the geneticist might not have realized that mom did have some of the same symptoms. Mom did not have as many symptoms as Kynzleigh and Laura Parrish had.] Multiple rounds of genetic testing including WES and mitochondrial genome sequencing, were not suggestive of mitochondrial disorder or any other genetic diseases. The family was offered the option of applying for further testing in the [facility]. The family applied for that program, but were not accepted due to the fact that the program wanted patients with different issues.     C). When Haile saw Dr Penni Bombard, a geneticist in [place], he confirmed that she does have a mitochondrial disorder and also has Ehlers-Danlos syndrome.     6). Feeling unwell, fatigue, fevers, headaches, nasal and head congestion, lack of  appetite: These problems vary from one part of the day to another. She has at least one of them every day.     A). Fatigue: She does not feel tired when she awakens, but does begin to feel tired about 2 PM. If she naps for an hour or two, the fatigue improves for several hours, but then recurs around 6 PM. If she does not nap, she remains tired and develops a bitemporal headache about 4-5 PM. She occasionally has a problem with insomnia, but not with early awakening. Sleeping in on weekends does not improve her fatigue.      B). Fevers: When she feels bad she usually has fevers. Most of her fevers are in the 98.8-99.2 range. These "fevers" occur about 5-7 PM.     C). Nasal and head congestion: She has had a stuffed  up nose for 4-5 months. When she has nasal congestion she also sometimes has head congestion. However, she does not have head congestion without having nasal congestion.      D). Headaches: She often has nuchal headaches when her trapezius muscles are sore/stiff. Sometimes these nuchal headache extend to the vertex. She also has bitemporal headaches when she gets tired.   F. Pertinent family history:   1). Stature and puberty: Mom is 5-1. Dad is 5-5. Mom had menarche at age 77. Mom has had irregular menses prior to her first pregnancy. She was diagnosed with endometriosis after her first pregnancy. After treatment for endometriosis, presumably GnRH analogues for 6 months, her menses were normal.     2). Obesity: Maternal grandmother   3). DM: Maternal grandmother has T2DM. A maternal grand uncle had DM, possibly type 1.    4). Thyroid: 30 y.o. sister has had a few abnormal thyroid hormone levels. Paternal grandmother is hypothyroid, without having had thyroid surgery, irradiation. or been on a prolonged low iodine diet.    5). ASCVD: Mother has a heart murmur.    6). Cancers: paternal grandfather dies of lung CA. There was also leukemia in a distant paternal relative.    7). Others: Older  sister has had oligomenorrhea frequently. Mother has migraines. Younger brother has worse mitochondrial dysfunction and seizures. Maternal grandmother has osteoarthritis and fibromyalgia.  Maternal grandmother takes B12 shots for anemia. Maternal grandmother also has hypertension.  One maternal uncle had cardiomyopathy. Maternal grand uncle had his first CVA at age 43. A distant paternal second cousin and a paternal first cousin had lupus.]  G. Lifestyle:   1). Naidelyn's diet: She can eat food normally and typically eats 3 small meals per day. She estimates that about 40% of her calories comes from oral intake. She does not eat much at any one time. She is not a "foodie" by nature and is a very picky eater.  She has G-tube feedings every night: 2 cans of Vital Peptide, 1.5 calories per mL, continuous feeding, from 9:30 PM to 7:30 AM via a pump. She does not usually take fluids by the G-tube during the day, but may do so during the Summer months to prevent dehydration.    2). She receives iv fluid infusions by her port-a-cath every other week: D5 1/2 NS, three 1000 ml bags over 12 hours. If she gets sick or gets dehydrated, she gets 3000 ml of D10NS iv over 12 hour. She receives substantial home health nursing support from [facility].     3). Physical activities: She does not walk much for exercise because walking causes her feet to swell. She does not go outside much during the Winter months. She was told by one doctor that she had angioedema.    4). Medications:    A). Morning medications: CoQ10, vitamin E, vitamin C. Prevacid, and allergy medications. Breakfast is usually a fruit smoothy.     B). Bedtime: LT4, Topamax, Lyrica, Seasonale,  2. Clinical course:  A. In May 2020 she had a central venous catheter access device inserted. The catheter is working well.  Crissie Figures saw Dr. Damita Lack on 11/19/18. Her PFTs were essentially normal. The MIP/MEPs were better, but still low. Her sleep study was  normal. He put her on Mestinon three times daily. The Mestinon helped for several hours.   C. She also saw Dr. Artis Flock on 11/19/18. Dr. Artis Flock ordered an EMG to be done at [facility].   Laura Parrish  has a long history of developing physical symptoms that she attributes to adverse reactions to medications and then stopping those medications without discussing the issues with the physicians that prescribed the medications. I wonder if some of those reactions were really due to the medications involved. She also has a history of not always following up with specialists that she has seen if what they prescribed she felt was ineffective or might have caused adverse reactions .   E. I tried to start her on fludrocortisone in January 2021, but she stopped it after about 3 weeks because it made her feel bad.   F. On 03/04/19 Dr. Artis Flock had a video visit with Laura Parrish and increased her mestinon to 120 mg, three times daily. Al feels that the mestinon is helping "a little bit".   G. On 03/06/19 she saw a GI specialist at [facility]. She was treated with IBGard as needed. She did not feel that the IBGard helped very much and did not contact the specialist for follow up.   H. On 03/24/19 Laura Parrish's G-tube and port-a-cath were replaced.    I. On 05/11/19 she was seen in ortho. A diagnosis of bilateral shoulder subluxations was made, presumably due to Ehlers-Danlos syndrome. She was in PT.   J. On 06/23/19 she had a NCV/EMG study at [facility]. Family does not know the results.   K. She saw ENT in October 2021. 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 The ENT suggested that she might have TMJ.  3. Laura Parrish's last [facility] visit occurred on 08/27/20. I continued her on Synthroid 88 mcg/day and rabeprazole, 20 mg, twice daily. The rabeprazole his helping.   A. In the interim she has been fairly healthy, except for a some drops in BG.   B. She saw GYN today and was diagnosed with endometriosis.  C. Her low BGs occur  30-120 minutes after eating. She snacks frequently during the day. She usually never eats large meals because her stomach feels too full. She says that she is now taking in more calories via her G-tube than orally. She now increases her G-tube feedings at night to stabilize her BGs during the night. She no longer has nausea in the middle of the night and not often during the day. In the past the nausea often took away her appetite. Sometimes when she ate the nausea became worse. Overall the amount of nausea has decreased.   C. She still has some back pain, part of which may be due to endometriosis.   D. She has had more excess vaginal bleeding. Laura Parrish will probably soon be converted to another OCP.   E. She feels hot at night. She still tends to be cold during the day. F. She is still often tired, "the same".   G. She thinks her headaches are better, in that they are less frequent, but still severe. She now has headaches about every 2 weeks.   H. She still has orthostatic dizziness, but not as often.    I. Her breathing difficulties have remained about the same. Dr. Damita Lack feels that she may have muscle weakness. He has referred her back to Dr. Artis Flock. Dr Artis Flock thinks Marithza may have myasthenia.   J. She still takes 88 mcg/day of Synthroid. She still has weekly 12-hour iv infusions of D5. She feels better for about a week after the infusions. She has been taking rabeprazole twice a day because she is "terrible about taking night-time medicines". She is also taking more  Skelaxin.   K.  She continues to have episodic pains in the TMJ area.   L. The eczema around her eyes and forehead is better. She still applies a clobetasol cream given her by dermatology in [place].   4. Pertinent Review of Systems:  Constitutional: Shadie says she feels "good". She is "less tired". She is colder than others all of the time. When she was out in the heat during the Summer, she sometimes sweated. She is also now sweating  in her hands, axillae, and groin at other times.  Eyes: Vision is "not great". She saw a neuroophthalmologist on 06/16/19. The possibility of optic neuropathy, similar to her brother, was raised. Anairis will be followed every two years.  There are no other significant eye complaints. Neck: The patient has had more complaints of anterior neck soreness and difficulty swallowing recently.  Heart: Her heart rate has not felt subjectively to be unusually fast or slow. The patient has few complaints of palpitations, irregular heat beats, chest pain, or chest pressure. Gastrointestinal: As above. She has much less nausea in the middle of the night. She no longer has to take Zofran very often. She no longer has nausea during the day or during the night. She has not been able to identify a particular trigger. She also occasionally has some mild reflux. She no longer has epigastric pains. Bowel movents have been more normal. She is still having alternating diarrhea and constipation. The patient has no complaints of excessive hunger. Hands: Hands are dry now, but will be more moist later in the day. Strength is "fine".   Legs: Muscle mass and strength "are the same". She occasionally has pains of her anterior thighs and anterior shins. There are no other complaints of numbness, tingling, burning, or pain. She notes edema about every day. Edema is worse in the evenings.   Feet: Feet are always wet and cold. There are no obvious foot problems. She occasionally has numbing of the medial 3-4 toes. There are no other complaints of numbness, tingling, burning, or pain. She often has dorsal edema.  GYN: As above.   PAST MEDICAL, FAMILY, AND SOCIAL HISTORY  Past Medical History:  Diagnosis Date   Allergy to alpha-gal    elevated IgE 04/10/16   Autonomic dysfunction    Chiari I malformation (HCC)    Complication of anesthesia    slow to wake up   Dyspnea    Eczema    GERD (gastroesophageal reflux disease)     Headache    Heart murmur    Hypoglycemia    Hypothyroid    IBS (irritable bowel syndrome)    Joint pain    Mitochondrial disease (HCC)    Muscle pain    Pneumonia    several times   PONV (postoperative nausea and vomiting)     Family History  Problem Relation Age of Onset   Asthma Mother    Allergic rhinitis Mother    Heart murmur Mother    Allergic rhinitis Sister    Thyroid disease Sister    Allergic rhinitis Brother    Asthma Brother    Seizures Brother    Mitochondrial disorder Brother    Allergic rhinitis Maternal Grandmother    Asthma Maternal Grandmother    Diabetes type II Maternal Grandmother    Lung cancer Maternal Grandfather    Angioedema Neg Hx    Eczema Neg Hx    Immunodeficiency Neg Hx    Urticaria Neg Hx  Current Outpatient Medications:    ascorbic acid (VITAMIN C) 500 MG tablet, Take 500 mg by mouth daily. , Disp: , Rfl:    Coenzyme Q-10 200 MG CAPS, Take 1 capsule by mouth daily., Disp: , Rfl:    Continuous Blood Gluc Receiver (DEXCOM G6 RECEIVER) DEVI, 1 Device by Does not apply route as directed., Disp: 1 each, Rfl: 2   Continuous Blood Gluc Sensor (DEXCOM G6 SENSOR) MISC, Inject 1 applicator into the skin as directed. (change sensor every 10 days), Disp: 3 each, Rfl: 11   Continuous Blood Gluc Sensor (FREESTYLE LIBRE 2 SENSOR) MISC, Inject 1 Device into the skin every 14 (fourteen) days., Disp: 2 each, Rfl: 11   Continuous Blood Gluc Transmit (DEXCOM G6 TRANSMITTER) MISC, Inject 1 Device into the skin as directed. (re-use up to 8x with each new sensor), Disp: 1 each, Rfl: 3   leucovorin (WELLCOVORIN) 25 MG tablet, TAKE 1 TABLET BY MOUTH 2 TIMES A DAY, Disp: 180 tablet, Rfl: 0   levOCARNitine (CARNITOR) 1 GM/10ML solution, TAKE 2 TEASPOONFUL ( ) BY MOUTH TWICEDAILY (Patient taking differently: Place 1,000 mg into feeding tube daily.), Disp: 118 mL, Rfl: 12   levonorgestrel-ethinyl estradiol (SEASONALE) 0.15-0.03 MG tablet, TAKE 1 TABLET BY MOUTH  EVERY DAY, Disp: 91 tablet, Rfl: 3   lidocaine (LIDODERM) 5 %, Place 1 patch onto the skin daily. Remove & Discard patch within 12 hours or as directed by MD, Disp: 30 patch, Rfl: 0   lidocaine (XYLOCAINE) 5 % ointment, Apply 1 application topically as needed., Disp: 35.44 g, Rfl: 3   metaxalone (SKELAXIN) 800 MG tablet, TAKE 1/2 TABLET (400MG  TOTAL) BY MOUTH DAILY AS NEEDED (MUSCLE PAIN), Disp: 30 tablet, Rfl: 3   Nutritional Supplement LIQD, Place 720 mLs into feeding tube as directed. Dispense Ensure Clear for nightly tube feedings of 720 ml at 85 ml/hr, Disp: 216000 mL, Rfl: 12   ondansetron (ZOFRAN-ODT) 4 MG disintegrating tablet, Take 1 tablet under the tongue every 6-8 hours as needed for nausea., Disp: 30 tablet, Rfl: 5   pregabalin (LYRICA) 25 MG capsule, Take 1 capsule (25 mg total) by mouth 2 (two) times daily., Disp: 60 capsule, Rfl: 5   pregabalin (LYRICA) 75 MG capsule, TAKE 2 CAPSULES BY MOUTH EACH NIGHT AT BEDTIME, Disp: 60 capsule, Rfl: 5   PRESCRIPTION MEDICATION, Apply 1 application topically as needed (port). Elma- cream , Disp: , Rfl:    promethazine (PHENERGAN) 25 MG tablet, Take 1 tablet at onset of migraine. May repeat every 6 hours PRN, Disp: 30 tablet, Rfl: 0   pyridostigmine (MESTINON) 60 MG tablet, Take 1 tablet (60 mg total) by mouth in the morning, at noon, in the evening, and at bedtime., Disp: 120 tablet, Rfl: 5   RABEprazole (ACIPHEX) 20 MG tablet, TAKE ONE TABLET TWICE DAILY., Disp: 60 tablet, Rfl: 6   rizatriptan (MAXALT) 10 MG tablet, TAKE 1 TABLET BY MOUTH AS NEEDED FOR MIGRAINE. MAY REPEAT IN 2 HOURS IF NEEDED., Disp: 12 tablet, Rfl: 3   SYNTHROID 88 MCG tablet, TAKE 1 TABLET BY MOUTH EVERY MORNING ON AN EMPTY STOMACH, Disp: 90 tablet, Rfl: 1   topiramate (TOPAMAX) 25 MG tablet, TAKE 1 TABLET BY MOUTH EVERY NIGHT AT BEDTIME, Disp: 30 tablet, Rfl: 3   Topiramate ER (TROKENDI XR) 50 MG CP24, TAKE 1 CAPSULE BY MOUTH DAILY, Disp: 30 capsule, Rfl: 5   traZODone  (DESYREL) 50 MG tablet, TAKE 1/2 TABLET BY MOUTH 30 MINUTES PRIOR TO BEDTIME. IF NEEDED, MAY INCREASE TO  1 TABLET BEFORE BEDTIME., Disp: 30 tablet, Rfl: 1   albuterol (ACCUNEB) 0.63 MG/3ML nebulizer solution, Take 3ml by nebulizer 3 times per day as needed for shortness of breath (Patient not taking: Reported on 12/27/2020), Disp: 270 mL, Rfl: 5   albuterol (PROVENTIL HFA) 108 (90 Base) MCG/ACT inhaler, Inhale 2 puffs into the lungs every 4 (four) hours as needed for wheezing or shortness of breath., Disp: 6.7 g, Rfl: 2   azelastine (ASTELIN) 0.1 % nasal spray, Place 2 sprays into both nostrils 2 (two) times daily. Use in each nostril as directed (Patient not taking: Reported on 12/27/2020), Disp: 30 mL, Rfl: 5   clobetasol (OLUX) 0.05 % topical foam, Apply topically 2 (two) times daily. Apply to affected areas twice daily as needed taking care to avoid axillae and groin area. (Patient not taking: Reported on 12/27/2020), Disp: 45 g, Rfl: 3   EPINEPHrine 0.3 mg/0.3 mL IJ SOAJ injection, Inject 0.3 mg into the muscle once. As needed (Patient not taking: Reported on 12/27/2020), Disp: , Rfl:    Glucagon (BAQSIMI TWO PACK) 3 MG/DOSE POWD, Place 1 spray into the nose as directed. (Patient not taking: Reported on 12/27/2020), Disp: 2 each, Rfl: 3  Allergies as of 12/27/2020 - Review Complete 12/27/2020  Allergen Reaction Noted   Other Anaphylaxis 03/29/2013   Latex Rash 04/08/2012   Lactated ringers  05/28/2018   Propofol Other (See Comments) 05/17/2012   Tape Itching and Other (See Comments) 05/17/2012     reports that she has never smoked. She has never used smokeless tobacco. She reports that she does not drink alcohol and does not use drugs. Pediatric History  Patient Parents   Ishman,Leesa (Mother)   Hilbert Corrigan (Father)   Other Topics Concern   Not on file  Social History Narrative   Blimy graduated from [employer]. Attends [employer]. She is majoring in History  This is her senior year!    She enjoys reading, playing with her cat and her dog, and hanging out with her sister.     1. School and Family: She attends [employer] with mostly on-campus classes. She lives at home.  2. Activities: Sedentary 3. Primary Care Provider: Verdene Lennert, MD  4. Peds Neurology: Dr. [name], MD 5. Allergy: Dr. Malachi Bonds, [facility] [place] 6. GI: No one at present 7. Peds Endocrinology: Dr. Fransico Gracynn Rajewski 8. Peds Pulmonary: Dr. Damita Lack 9. Genetics: [facility] in the past 10. GYN:   REVIEW OF SYSTEMS: There are no other significant problems involving Laura Parrish's other body systems.    Objective:  Objective  Vital Signs:  BP 118/80    Pulse 76    Wt 124 lb 2 oz (56.3 kg)    BMI 20.98 kg/m    Ht Readings from Last 3 Encounters:  10/30/20 5' 4.5" (1.638 m)  07/26/20 5' 4.13" (1.629 m)  06/22/20 5' 3.98" (1.625 m)   Wt Readings from Last 3 Encounters:  12/27/20 124 lb 2 oz (56.3 kg)  10/30/20 125 lb 12.8 oz (57.1 kg)  08/27/20 118 lb (53.5 kg)   HC Readings from Last 3 Encounters:  06/22/20 20.87" (53 cm)   Body surface area is 1.6 meters squared. Facility age limit for growth percentiles is 20 years. Facility age limit for growth percentiles is 20 years.   PHYSICAL EXAM:  Constitutional: Laura Parrish looks somewhat tired, but better. She is awake and alert, but fairly passive. She answers questions with 1-3 word answers, but does not  volunteer any information. Her affect is rather flat. Her insight is good. She has gained 6 pounds in 4 months.   Eyes: There is no arcus or proptosis.  Mouth: The oropharynx appears normal. The tongue appears normal. There is normal oral moisture. There is no obvious gingivitis. Thre is no oral hyperpigmentation.  Neck: There are no bruits present. The thyroid gland appears mildly enlarged.  The thyroid gland is slightly smaller at about 21+ grams in size. Today the right lobe has shrunk back to  top-normal size and her left lobe is larger. The consistency of the left lobe of the gland is full, but the right lobe is normal.  She has no thyroid tenderness today.  Lungs: The lungs are clear. Air movement is good. Heart: The heart rhythm and rate appear normal. Heart sounds S1 and S2 are normal. I do not appreciate any pathologic heart murmurs. Abdomen: The abdomen is normal in size. Bowel sounds are normal. The G-tube site has some serosanguinous liquid and crusting around the site. I asked her to clean the area with alcohol. There is no obviously palpable hepatomegaly, splenomegaly, or other masses. There was no tenderness to palpation.  Arms: Muscle mass appears appropriate for age.  Hands: There is no obvious tremor. Phalangeal and metacarpophalangeal joints appear normal. Palms are normal. Nails are not pallid.  Legs: Muscle mass appears appropriate for age. There is no edema.  Neurologic: Muscle strength is 4-5/5 in the UEs and 4-5/5 in th LEs. Muscle tone appears normal. Sensation to touch is normal in the legs.   LAB DATA:   Labs 12/18/20: TSH 0.60, free T4 1.5, free T3 3.3; CMP normal, except chloride 117 (ref 98-110) and CO2 12 (ref 29-32); CBC normal, except RBC 5.18 (ref 3.80-5.10), HCT 46.7 (ref 35-45), eosinophils 611 (ref 15-500); C-peptide 1.98 (ref 0.80-3.85); iron 146 (ref 40-190)  Labs 08/27/20: CBG 105  Labs 08/15/20: TSH 1.14, free T4 1.3, free T3 2.8; CMP normal, except CO2 19 (ref 20-32); CBC normal, except RC 5.12 (ref 3.80-5.10); iron 102 (ref 40-190); prolactin 7.1 (ref 3-30)  Labs 04/20/20: TSH 1.14, free T4 1.4, free T3 2.9; CBC normal, except platelets 428 (ref 140-400);   Labs 11/17/19: TSH 1.21, free T4 1.1, free T3 2.9 (ref 3.0-4.7); CBC normal; iron 69; LH <0.2, FSH 0.7,   Labs 07/25/19: HbA1c 4.8%,  TSH 1.67, free T4 1.3, free T3 3.0; CBC normal, except RBC 5.22 (ref 3.8-5.1) and Hct 45.5% (ref 35-45%); iron 79 (ref 40-190)  Labs 03/24/19: Serum sodium 141,  potassium 5.2, chloride 19, CO2 18, glucose 81  Labs 02/10/19: HbA1c 5.0%; TSH 0.73, free T4 1.3, free T3 3.0; CMP normal  Labs 11/12/18: TSH 1.12, free T4 1.38, T3 156 (ref 71-180); prolactin 11.3 (ref 4.8-23.3)  Labs 09/16/18: Androstenedione 55 (ref 51-230), DHEAS 67 (ref 51-321)  Labs 08/30/18 at 8:43 AM: ACTH 13 (ref 6-50), cortisol 26.2 (ref 4-22), aldosterone 4 (ref < 28), plasma renin activity 2.4 (ref 0.25-5.82),   Labs 08/02/18: TSH 2.12, free T4 1.3, free T3 3.0; CMP normal; aldosterone <1 (reg 3-28), plasma renin activity (PRA) 0.92 (ref 0.25-5.82); prolactin 9.9 (ref 3-30)  Labs 02/01/18 at 8 AM: TSH 4.16 (ref 0.50-4.30, but many endocrinologists consider 3.4 to be the true physiologic upper limit of normal), free T4 1.4 (ref 0.8-1.4), free T3 2.7 (ref 3.0-4.7), TPO antibody 241 (ref <9), thyroglobulin antibody 1 (ref < or = 1); CMP normal except CO2 19; cholesterol 123, triglycerides 114, HDL 37, LDL 66; Prolactin  30.8 (ref 3.3-20); IGFBP-3 7.8 (ref 3.2-7.9); IGF-1 249 (ref 108-548, but very appropriate for a young woman whose height has plateaued); iron 177 (ref 27-164); CBC normal; androstenedione 46 (ref 51-230), DHEAS 74 (ref 51-321); ACTH 14, cortisol 28.9  Labs 11/25/17: TSH 1.58; CBC normal except 736 eosinophils (ref 15-500)  Labs 06/24/17: TSH 2.418, free T4 0.98  Labs 04/21/17: TSH 1.30  Labs 03/10/17: TSH 7.87, free T4 1.2; CBC normal; prolactin 7.1  Labs 06/24/16: TSH 1.960, free T4 1.12  Labs 04/10/16: TSH 2.28, TPO antibody 165 (ref <9), thyroglobulin antibody 2 (ref <2)  Labs 05/29/15: TSH 1.640, free T4 1.09  Labs 11/28/14: TSH 1.87, free T4 0.80  Labs 05/31/2014: TSH 1.680, free T4 1.23  Labs 04/19/14: TSH 1.19  Labs 09/30/13: TSH 2.53, free T4 1.14  Labs 02/04/13: TSH 0.88, T4 11.3  Labs 10/18/12: TSH 10.35 (ref 0.5-4.5), free T4 0.67 (ref 0.8-2.0)  IMAGING  MRI C-spine 12/18/20: Small syrinx in the lower cervical cord. 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 Normal thoraco-lumber spine.   Thyroid ultrasound 12/01/19: Small, heterogenous thyroid. 3 mm hypoechoic nodule left inferior lobe. No indication for biopsy or dedicated imaging follow up.     Assessment and Plan:  Assessment  ASSESSMENT:  Sumner is a 20 y.o. Caucasian young woman with multiple medical problems as noted below. At this visit, she is having active Hashimoto's thyroiditis, but is clinically and chemically euthyroid. She is having less fatigue. Her extreme nausea and stomach discomfort have improved while taking rabeprazole once daily. Her unintentional weight loss has resolved and she is gaining tissue weight. Although her BP is still relatively low, she is having less dizziness and more sweating, possibly associated with improvement in her dysautonomia. Her hypoglycemic symptoms persist. Overall she appears to be doing better.   1-3. Hypothyroid, acquired/goiter/thyroiditis:   A. The occurrence of autoimmune thyroid disease with positive anti-thyroid antibodies is very common.   B. At her visit in January 2020 her TSH was >4.0 and her TPO antibody level was higher. We increased her Synthroid dose to 88 mcg/day.   C. From January to August 2020 her goiter remained the same in overall size, but the lobes had shifted in size. The process of waxing and waning of thyroid gland size was c/w evolving Hashimoto's thyroiditis.   D. At her August visit her TFTs were at about the 30% of the physiologic range. I would usually have  increased the Synthroid dose to 100 mcg/day, but decided to await the results of her Hypothalamic-Pituitary-Adrenal (HPA) axis testing. Fortunately, those results were normal.   E. Since her August 2020 visit she has had intermittent flare ups of thyroiditis.   E. Her TFTs drawn on 11/12/18 were mid-euthyroid on her current  Synthroid dose of 88 mcg/day. Her TFTs on 02/10/19 were at about the 75% of the physiologic range. Her TFTs in July 2021 were mid-euthyroid. Her TFTs in November were mid-euthyroid according to her TSH and free T4, but slightly low according to her free T3. These changes were c/w a flare up of thyroiditis, which she was having. She looked mildly hypothyroid clinically at that time.   F. Her thyroid US in November 2021 was read as normal by the radiologist. By our standards the left and right lobes were mildly enlarged.   G. Her goiter is still asymmetric today, but a bit smaller. The gland  is not tender to palpation today. Her TFTs in April 2022 and August 2022 were mid-normal. Her TFTS in December 2022 were at about th 90% of the physiologic range. We will continue the 88 mcg dose for now, but repeat her TFTs prior to her next visit.   H. The episodic tenderness of the gland and the shifting of size in the lobes from visit to visit is c/w evolving Hashimoto's thyroiditis.  4. Dizziness/hypotension:   A. She still occasionally has elements of spinning dizziness c/w vertigo, orthostatic dizziness c/w POTS /dysautonomia and intermittent dehydration. Fortunately, these symptoms have been less frequent and less severe.   B. Chewing gum and taking meclizine can help the vertigo.  C. Remaining well hydrated can help with the ortho stasis.    D. Since both her aldosterone and renin were "lowish" in October. It was reasonable to conduct a therapeutic trial of fludrocortisone. 0.1 mg/day. Unfortunately, she felt that the medication did not help her, so stopped it.   E. She feels that she has been having less dizziness recently.  5-7. Weakness/fatigue/cold sensation:    A. The cause(s) of these problems is/are unclear. 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   B. Her ACTH and adrenal hormone tests were normal in January 2020 and again in August and September 2020. Her  aldosterone was very low in July 2020 but normal in August.   C. As noted above, she was euthyroid in July and November 2021.   D. She could have fibromyalgia, chronic fatigue, or some other "autoimmune" problem. Perhaps she does have mitochondrial dysfunction or myasthenia.   E. Giving the infusions of D5W every week has made Laura Parrish feel better.   F. Hot showers cause her to feel weak and dizzy. This problem is c/w orthostatic hypotension and with dysautonomia.  G. Interestingly, her fatigue is better and her evening mildly elevated temperature sensations are bless frequent.   8. Hypoglycemia:   A. The cause(s) of this problem is/are also unclear. Her hypothalamic-pituitary-hepatic axis for [facility] and her hypothalamic-pituitary-adrenal axis seemed to be normal previously. She could have celiac disease, but her tests in December 2019 were negative. She could have a problem with glycogen storage, glycogenolysis, and/or gluconeogenesis. The fact that she often does not eat much, and the fact that she appears to have a very active gastro-colic reflex may interact to impair glucose intake, glucose absorption, and glycogen synthesis.  B. She still had hypoglycemic symptoms if she does not eat frequently.   Laura Parrish had a consultation visit with Dr. Ladona Ridgel, our PharmD clinical pharmacist and diabetes educator. Dr. Ladona Ridgel ordered a Dexcom for Laura Parrish. Caleb says that her Dexcom generally runs about 16 points higher than her CBGs.   9. Abnormal menses:   A. The cause(s) of this problem is/are also unclear. Mother gives a good history for her own endometriosis that resolved after treatment with GnRH agonists. Laura Parrish may also have endometriosis.   BGerarda Parrish has recently had more excessive bleeding.   C. Her menses are now c/w her [place] OCPs.  10. Alternating constipation and diarrhea: She has had a very active gastro-colic reflex. Her bowel movements still alternate between diarrhea and constipation, but perhaps less  so. She was seen by GI in February 2021, but has not returned to clinic. She is doing fairly well now.  11. Hyperprolactinemia:   A. It is highly likely that her elevated prolactin In January 2020 was due to her  hypothyroidism.   B. Her prolactin level in July 2020 was well within normal, paralleling her improvement in thyroid hormone status. Her prolactin level in October 2020 was also quite normal.   Her prolactin level in August 2022 was at about the 25% of the reference range. 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 She may have dysautonomia.  13. Dehydration: She is consciously trying to drink 32 ounces of fluid per day. This problem could be due to an inadequate renin-angiotensin-aldosterone system, or to chronic diarrhea, or to other causes.   14. Bradycardia:   A. She is not on a beta-blocker or combined alpha-beta blocker. The fact that mom also has bradycardia suggests a dysautonomia. Hypothyroidism may also have been a causal factor in the past.  B. Recently, most of her HRs have been in the 60s-70s. HR today was 76. 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 The aldosterone level and the renin level in August were normal. Her renal function and her adrenal functions were normal in August and September. As noted above, we tried to  conduct a therapeutic trial of fludrocortisone.  16. Breathing difficulty:  Dr. Damita Lack was following this problem, but referred her back to Dr. Artis Flock.   17. Pallid nail beds. Her CBC in July and November 2021 and in April 2022 and August 2022 were essentially normal. Her iron levels in July and November 2021 and in August 2022 were normal. She does not have pallor today. 18. Nausea/heartburn: She is doing better with rabeprazole twice daily.   19. Weight loss, unintentional:  A. Nausea sometimes adversely affects her appetite. If she eats too much, she develops stomach pains.  These  symptoms are c/w hyperacidity. Fortunately, her nausea has significantly improved.  B. She has been gaining weight since starting rabeprazole. She gained 6 pounds since her last visit.    PLAN:  1. Diagnostic: I reviewed lab results. I ordered TFTs, CBC, iron to be done 1-2 weeks prior to her next visit.   2. Therapeutic: Continue Synthroid 88 mcg/day for now.  Take rabeprazole, 20 mg, twice daily. 3. Patient education: We discussed all of the above at great length. We again discussed the 10 [place] method to maintain BP when she arises from a supine or sitting position.   4. Follow-up: 4 months.     David Stall, MD, CDE Pediatric and Adult Endocrinology

## 2021-01-09 ENCOUNTER — Other Ambulatory Visit: Payer: Self-pay | Admitting: Internal Medicine

## 2021-01-16 ENCOUNTER — Other Ambulatory Visit (INDEPENDENT_AMBULATORY_CARE_PROVIDER_SITE_OTHER): Payer: Self-pay | Admitting: Dietician

## 2021-01-16 ENCOUNTER — Other Ambulatory Visit (INDEPENDENT_AMBULATORY_CARE_PROVIDER_SITE_OTHER): Payer: Self-pay | Admitting: Family

## 2021-01-16 DIAGNOSIS — Z931 Gastrostomy status: Secondary | ICD-10-CM

## 2021-01-16 DIAGNOSIS — E8840 Mitochondrial metabolism disorder, unspecified: Secondary | ICD-10-CM

## 2021-01-16 MED ORDER — NUTRITIONAL SUPPLEMENT PLUS PO LIQD: ORAL | 12 refills | Status: DC

## 2021-01-16 NOTE — Progress Notes (Signed)
Mom called explaining pt has not been receiving formula from DME company and asked that a new order be put in.

## 2021-01-17 ENCOUNTER — Telehealth (INDEPENDENT_AMBULATORY_CARE_PROVIDER_SITE_OTHER): Payer: Self-pay | Admitting: Pediatrics

## 2021-01-17 NOTE — Telephone Encounter (Signed)
°  Who's calling (name and relationship to patient) : Charlcie Cradle from [place]  Best contact number: [phone]  Provider they see: Dr. Artis Flock  Reason for call: Charlcie Cradle states that they received orders and demographics for this patient but no supporting documentation or chart notes. Please call her back so she can tell you exactly what she needs.

## 2021-01-18 NOTE — Telephone Encounter (Signed)
Spoke with [place] who reports that they need office notes documenting that she has a g-tube and what formula she has. Sent office notes from 07/26/20 and care plan along with signed letter of medical necessity. Informed she has upcoming visit with our dietician 02/01/20 as well.

## 2021-01-23 NOTE — Progress Notes (Signed)
° °  Medical Nutrition Therapy - Progress Note Appt start time: 3:29 PM Appt end time: 3:48 PM  Reason for referral: Gtube dependence Referring provider: Dr. Artis Flock - [facility] Pertinent medical hx: Chiari I malformation, syringomyelia, mitochondrial disease, iron-deficiency anemia, hypoglycemia, goiter, chronic diarrhea, chronic dehydration, alpha-gal hypersensitivity, +G-tube, +port (receives weekly infusions)  Assessment: Food allergies: red meat (can consume poultry), lactose intolerance Pertinent Medications: see medication list Vitamins/Supplements: Co-Q10, Vitamin C (500 mg), vitamin E (200 IU), Calcium (50 mg)  Pertinent labs:  (12/6) Iron - 146 (WNL) (12/6) CBC: RBC - 5.18 (high), HCT - 46.7 (high) (12/6) CMP: Chloride - 117 (high) (12/6) Thyroid Panel - WNL (8/15) POCT Glucose - 105 (high)  (1/19) Anthropometrics: Ht: 162 cm  Wt: 56.7 kg  BMI: 21.6  IBW based on hamwi equation @ 56.8 kg (+/-10%)  12/27/20 Wt: 56.4 kg 10/30/20 Wt: 57 kg 08/27/20 Wt: 55 kg 07/26/20 Wt: 54 kg 06/22/20 Wt: 52.3 kg 05/31/20 Wt: 54 kg 04/23/20 Wt: 50 kg  Estimated minimum caloric needs: 2000-2500 kcal/day (EER) Estimated minimum protein needs: 0.8 g/kg/day (DRI) Estimated minimum fluid needs: 2000-2500 mL/day (1 mL/kcal)  Primary concerns today: Follow-up for Gtube dependence. Pt previously followed by past RD, Kat Mikelaites. Mom accompanied pt to appt today.   Dietary Intake Hx: Usual eating pattern includes: 3 meals and 3-4 snacks per day.  How long does it usually take to finish a meal: <30 minutes  Texture modifications: chopped foods  Chewing or swallowing difficulties with foods and/or liquids: coughing only with foods   Usual feeding regimen: 711 mL (3 cans) Ensure clear @ 85 mL/hr for 8.5 hours (9:30 PM - 6 AM); 2 bags of D5 infusions weekly @ 200 mL/hr x 12-14 hours   24-hr recall: Breakfast: smoothie (pb, banana, oatmeal, 2%  fairlife lactose free) OR 2 packet oatmeal w/ water +  coffee or tea  Lunch: 2 chicken tenders + fruit or chips OR small amount chicken salad w/ crackers + fruit + sweet tea/soda  Dinner: whatever family is having (starch, protein, vegetable) + sweet tea/water   Typical Snacks: mini muffins, fruit, gogurt, kind bars Typical Beverages: water (~64 oz), sweet tea (1 cup/day), soda (1 can/day), juice (1-2 bottles depending on blood sugar) Supplements: 3 Ensure Clear  GI: continued mix of constipation/diarrhea - followed by GI   Physical Activity: normal ADL and walking around campus/walking dog    Estimated intake likely meeting needs given adequate growth.  Pt consuming various food groups.  Pt consuming adequate amounts of each food group.   Nutrition Diagnosis: (1/19) Inadequate oral intake related to mitochondrial disorder causing decreased appetite and poor PO feeding as evidence by G-tube dependent to meet caloric, protein, and fluid needs.  Intervention: Discussed pt's growth and current regimen. Pt expresses interest in Boost Breeze peach flavored nutritional supplement for PO intake, RD will put in order with DME to aid in blood glucose stabilization and hydration status. Discussed recommendations below. All questions answered, family in agreement with plan.   Nutrition Recommendations: - I will put in an order for 1-2 boost breeze (peach flavor) daily.  - Continue current regimen.  - Continue eating a wide variety of all food groups (fruits, vegetables, whole grains, lean proteins, low-fat dairy.   Teach back method used.  Monitoring/Evaluation: Continue to Monitor: - Growth trends - PO intake  - TF tolerance  Follow-up in 3-6 months, joint with Dr. Artis Flock .  Total time spent in counseling: 19 minutes.

## 2021-01-23 NOTE — Progress Notes (Addendum)
Patient: Laura Parrish MRN: [id number] Sex: female DOB: 18-Dec-2000  Provider: Carylon Perches, MD Location of Care: [facility]- [facility] Care Note type: Routine return visit  History of Present Illness: Referral Source: Janne Napoleon, MD History from: patient and prior records Chief Complaint: complex care  Laura Parrish is a 21 y.o. 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 Patient was last seen 07/26/20 where biggest concern was symptoms of hypoglycemia.  Since that appointment, mom called 11/21/20 to report Laura Parrish woke up with morning in horrible pain, having bladder leakage, requiring she miss school for which I ordered an MRI which showed syrinx improved and the chiari unchanged.  Patient presents today with mother They report their largest concern is her continued pain.   Symptom management:  Patient reports she feels morning back pain is related to stomach pain. Complains of frequent urination. Legs hurting more, feels related to walking more at school. Foot pain has worsened as well.   Headaches: 2-3 a month, less severe. Takes maxalt and phenergan which alleviates them.   Care coordination (other providers): Scheduled to see Shirlee Limerick today.   Saw Dr. Leafy Ro in Gynocology 12/27/20, started norethindrone-ethinyl estradiol  for Dysmenorrhea and endometriosis. Saw Dr. Tobe Sos for endo 12/27/20, Synthroid and rabeprazole continued, f/u in 4 mo. Scheduled 04/29/21. Saw Dr. Estanislado Pandy in Rheumatology 10/30/20, f/u  PRN for raynauds phenomenon. Saw Dr. Heber Tecolotito  08/02/20, recommended f/u in 2 years, with plan to transitions to internal medicine cardiologist at next visit. Last g-tube change with Dr. Kieth Brightly 10/02/20  Care management needs:  She currently attends [employer] and works closely with the librarian to have accommodations while on campus.   Equipment needs:  Mom called to ask that feeding supply DME be switched to [place] on 01/17/21. She also wonders if patient qualifies to receive a new tens machine to help with pain management.   Patient needs a new electric wheelchair so that she is able to get around school and other places that require more mobility than her home.    Diagnostics/Patient history:  MRI Cervical Spine WO Contrast 12/18/20 Impression:  1. 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 2. Prior Chiari decompression.  MRI Brain WO Contrast 12/18/20 Impression:  Unremarkable appearance of the brain following Chiari I decompression.  MRI Lumbar Spine WO Contrast 12/18/20 Impression:  1. Normal MRI of the thoracic and lumbar spine. 2. 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  MRI Thoracic Spine WO Contrast 12/18/20 Impression:  1. Normal MRI of the thoracic and lumbar spine. 2. 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   Past Medical History Past Medical History:  Diagnosis Date   Allergy to alpha-gal    elevated IgE 04/10/16   Autonomic dysfunction    Chiari I malformation (HCC)    Complication of anesthesia    slow to wake up   Dyspnea    Eczema    GERD (gastroesophageal reflux disease)    Headache    Heart murmur    Hypoglycemia    Hypothyroid    IBS (irritable bowel syndrome)  Joint pain    Mitochondrial disease ([place])    Muscle pain    Pneumonia    several times   PONV (postoperative nausea and vomiting)     Surgical History Past Surgical History:  Procedure Laterality Date    ADENOIDECTOMY     2004or 2005   chiari decompression  2007   GALLBLADDER SURGERY  2015   GASTROSTOMY TUBE PLACEMENT     x 2   IR CV LINE INJECTION  07/06/2020   MUSCLE BIOPSY  2010   Port Replacement  05/2018   x 2 orignal 08/2016 then replacement 05/2018   PORT-A-CATH REMOVAL N/A 03/24/2019   Procedure: PORT-A-CATH REMOVAL;  Surgeon: Mickeal Skinner, MD;  Location: [place];  Service: General;  Laterality: N/A;   PORTACATH PLACEMENT Right 03/24/2019   Procedure: PORT-A-CATH INSERTION under ultrasound and fluoroscopic guidance;  Surgeon: Mickeal Skinner, MD;  Location: [place];  Service: General;  Laterality: Right;   REMOVAL OF GASTROSTOMY TUBE N/A 03/24/2019   Procedure: Exchange of Gastrostomy Tube;  Surgeon: Mickeal Skinner, MD;  Location: [place];  Service: General;  Laterality: N/A;   tethered cord  07/2006   TONSILLECTOMY     [id number]    Family History family history includes Allergic rhinitis in her brother, maternal grandmother, mother, and sister; Asthma in her brother, maternal grandmother, and mother; Diabetes type II in her maternal grandmother; Heart murmur in her mother; Lung cancer in her maternal grandfather; Mitochondrial disorder in her brother; Seizures in her brother; Thyroid disease in her sister.   Social History Social History   Social History Narrative   Laura Parrish graduated from [employer]. Attends [employer]. She is majoring in History This is her senior year!    She enjoys reading, playing with her cat and her dog, and hanging out with her sister.     Allergies Allergies  Allergen Reactions   Other Anaphylaxis    Alpha Gal allergy \ Red meat\ any hoof animal   Latex Rash   Lactated Ringers     Contraindicated w/ mitochondrial syndrome   Propofol Other (See Comments)    Due to possible mitochondrial disorder Due to possible mitochondrial disorder Due to possible mitochondrial disorder   Tape Itching and Other (See  Comments)    Medications Current Outpatient Medications on File Prior to Visit  Medication Sig Dispense Refill   ascorbic acid (VITAMIN C) 500 MG tablet Take 500 mg by mouth daily.      azelastine (ASTELIN) 0.1 % nasal spray Place 2 sprays into both nostrils 2 (two) times daily. Use in each nostril as directed 30 mL 5   clobetasol (OLUX) 0.05 % topical foam Apply topically 2 (two) times daily. Apply to affected areas twice daily as needed taking care to avoid axillae and groin area. 45 g 3   Coenzyme Q-10 200 MG CAPS Take 1 capsule by mouth daily.     Continuous Blood Gluc Receiver (DEXCOM G6 RECEIVER) DEVI 1 Device by Does not apply route as directed. 1 each 2   Continuous Blood Gluc Sensor (DEXCOM G6 SENSOR) MISC Inject 1 applicator into the skin as directed. (change sensor every 10 days) 3 each 11   Continuous Blood Gluc Transmit (DEXCOM G6 TRANSMITTER) MISC Inject 1 Device into the skin as directed. (re-use up to 8x with each new sensor) 1 each 3   HAILEY FE 1/20 1-20 MG-MCG tablet Take 1 tablet by mouth daily.     leucovorin (WELLCOVORIN) 25 MG  tablet TAKE 1 TABLET BY MOUTH 2 TIMES A DAY 180 tablet 0   levOCARNitine (CARNITOR) 1 GM/10ML solution TAKE 2 TEASPOONFUL (10ML) BY MOUTH TWICEDAILY (Patient taking differently: Place 1,000 mg into feeding tube daily.) 118 mL 12   lidocaine (XYLOCAINE) 5 % ointment Apply 1 application topically as needed. 35.44 g 3   metaxalone (SKELAXIN) 800 MG tablet TAKE 1/2 TABLET (400MG  TOTAL) BY MOUTH DAILY AS NEEDED (MUSCLE PAIN) 30 tablet 3   Nutritional Supplements (NUTRITIONAL SUPPLEMENT PLUS) LIQD 3 cartons (711 mL) of Ensure Clear given via gtube daily. 22041 mL 12   ondansetron (ZOFRAN-ODT) 4 MG disintegrating tablet Take 1 tablet under the tongue every 6-8 hours as needed for nausea. 30 tablet 5   pregabalin (LYRICA) 25 MG capsule Take 1 capsule (25 mg total) by mouth 2 (two) times daily. 60 capsule 5   pregabalin (LYRICA) 75 MG capsule TAKE 2 CAPSULES  BY MOUTH EACH NIGHT AT BEDTIME 60 capsule 5   PRESCRIPTION MEDICATION Apply 1 application topically as needed (port). [place]- cream      promethazine (PHENERGAN) 25 MG tablet Take 1 tablet at onset of migraine. May repeat every 6 hours PRN 30 tablet 0   pyridostigmine (MESTINON) 60 MG tablet Take 1 tablet (60 mg total) by mouth in the morning, at noon, in the evening, and at bedtime. 120 tablet 5   RABEprazole (ACIPHEX) 20 MG tablet TAKE ONE TABLET TWICE DAILY. 60 tablet 6   rizatriptan (MAXALT) 10 MG tablet TAKE 1 TABLET BY MOUTH AS NEEDED FOR MIGRAINE. MAY REPEAT IN 2 HOURS IF NEEDED. 12 tablet 3   SYNTHROID 88 MCG tablet TAKE 1 TABLET BY MOUTH EVERY MORNING ON AN EMPTY STOMACH 90 tablet 1   topiramate (TOPAMAX) 25 MG tablet TAKE 1 TABLET BY MOUTH EVERY NIGHT AT BEDTIME 30 tablet 3   Topiramate ER (TROKENDI XR) 50 MG CP24 TAKE 1 CAPSULE BY MOUTH DAILY 30 capsule 5   traZODone (DESYREL) 50 MG tablet TAKE 1/2 TABLET BY MOUTH 30 MINUTES PRIOR TO BEDTIME. IF NEEDED, MAY INCREASE TO 1 TABLET BEFORE BEDTIME 30 tablet 1   vitamin E 200 UNIT capsule Take 200 Units by mouth daily.     albuterol (ACCUNEB) 0.63 MG/3ML nebulizer solution Take 26ml by nebulizer 3 times per day as needed for shortness of breath (Patient not taking: Reported on 12/27/2020) 270 mL 5   albuterol (PROVENTIL HFA) 108 (90 Base) MCG/ACT inhaler Inhale 2 puffs into the lungs every 4 (four) hours as needed for wheezing or shortness of breath. 6.7 g 2   Continuous Blood Gluc Sensor (FREESTYLE LIBRE 2 SENSOR) MISC Inject 1 Device into the skin every 14 (fourteen) days. (Patient not taking: Reported on 01/31/2021) 2 each 11   EPINEPHrine 0.3 mg/0.3 mL IJ SOAJ injection Inject 0.3 mg into the muscle once. As needed (Patient not taking: Reported on 12/27/2020)     Glucagon (BAQSIMI TWO PACK) 3 MG/DOSE POWD Place 1 spray into the nose as directed. (Patient not taking: Reported on 12/27/2020) 2 each 3   norethindrone-ethinyl estradiol (LOESTRIN)  1-20 MG-MCG tablet Take by mouth. (Patient not taking: Reported on 01/31/2021)     No current facility-administered medications on file prior to visit.   The medication list was reviewed and reconciled. All changes or newly prescribed medications were explained.  A complete medication list was provided to the patient/caregiver.  Physical Exam BP 110/72    Pulse 72    Resp 20    Ht 5' 3.78" (1.62  m)    Wt 125 lb 3.2 oz (56.8 kg)    BMI 21.64 kg/m  Weight for age: Facility age limit for growth percentiles is 20 years.  Length for age: Facility age limit for growth percentiles is 20 years. BMI: Body mass index is 21.64 kg/m. No results found. Gen: well appearing young adult Skin: No rash, No neurocutaneous stigmata. HEENT: Normocephalic, no dysmorphic features, no conjunctival injection, nares patent, mucous membranes moist, oropharynx clear. Neck: Supple, no meningismus. No focal tenderness. Resp: Clear to auscultation bilaterally CV: Regular rate, normal S1/S2, no murmurs, no rubs Abd: BS present, abdomen soft, non-tender, non-distended. No hepatosplenomegaly or mass Back: Bilateral paraspinal tenderness and tenseness.  Ext: Warm and well-perfused. No deformities, no muscle wasting, ROM full.  Neurological Examination: MS: Awake, alert, interactive. Normal eye contact, answered the questions appropriately for age, speech was fluent,  Normal comprehension.  Attention and concentration were normal. Cranial Nerves: Pupils were equal and reactive to light;  normal fundoscopic exam with sharp discs, visual field full with confrontation test; EOM normal, no nystagmus; no ptsosis, no double vision, intact facial sensation, face symmetric with full strength of facial muscles, hearing intact to finger rub bilaterally, palate elevation is symmetric, tongue protrusion is symmetric with full movement to both sides.  Sternocleidomastoid and trapezius are with normal strength. Motor-Normal tone  throughout, Normal strength in all muscle groups. No abnormal movements Reflexes- Reflexes 2+ and symmetric in the biceps, triceps, patellar and achilles tendon. Plantar responses flexor bilaterally, no clonus noted Sensation: Intact to light touch in extremities. No sensory level to pinprick on back.  Reported pain to leg raise, however no evidence of sciatica.  Coordination: No dysmetria on FTN test. No difficulty with balance when standing on one foot bilaterally.   Gait: Normal gait.    Diagnosis:  1. Chronic bilateral low back pain without sciatica   2. Gastrostomy tube dependent ([place])   3. Complex care coordination   4. Migraine without aura and without status migrainosus, not intractable   5. 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 Chiari I malformation ([place])   7. Dysautonomia ([place])   8. Myasthenia gravis ([place])   9. Neuropathic pain   10. 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 female with history of Chairi malformation s/p repair, tethered cord s/p repair, feeding intolerance s/p gastrostomy tube, lactose intolerance and alpha gal allergy, multiple hormonal irregularities including hypothyroidism, chronic headaches, chronic GI complaints, neuropathic pain in her feet, and symptoms concerning for mitochondrial diease although genetic testing and muscle biopsy unrevealing who presents for follow-up in the pediatric complex care clinic.  Patient seen by case manager, dietician, integrated behavioral health today as well, please see accompanying notes.  I discussed case with all involved parties for coordination of care and recommend patient follow their instructions as below.   Symptom management:  Patient continues to have back pain, seems unrelated to nerve pain in spine, and is most likely related to muscle tightness. To address I recommend lifestyle modifications such as decreased standing, aqua therapy, as well as decreasing activities  that cause pain. Recommended skelaxin at night as well. Headaches seem well controlled on current medication regimen, continued this today.   - Recommended skelaxin, up to 1 tablet at night, for back pain  - Advised patient try acupuncture, water therapy, and massage for muscle tightness. Informed chiropractor is okay as long as there is no twisting or high velocity movement. -  Recommend she continue with all other medications.   Care coordination: - Patient to schedule an appointment with Dr. Estanislado Pandy to check on her labs  - Recommend she schedule with Dr. Kieth Brightly for a g-tube change  Care management needs:  - Advised patient reach out to [place] if she needs any letters supporting accomodation at school.   Equipment needs:  - Orders placed for a tens machine today, patient would benefit due to chronic pain.  - Orders placed for an electric wheelchair today. Patient has outgrown her previous wheelchair and needs this for mobility and safety when navigating farther distances.   Decision making/Advanced care planning: - Not addressed at this visit, patient remains at full code.   The CARE PLAN for reviewed and revised to represent the changes above.  This is available in Epic under snapshot, and a physical binder provided to the patient, that can be used for anyone providing care for the patient.   I spent 35 minutes on day of service on this patient including review of chart, discussion with patient and family, discussion of screening results, coordination with other providers and management of orders and paperwork.     Return in about 6 months (around 07/31/2021).  I, Scharlene Gloss, scribed for and in the presence of Carylon Perches, MD at today's visit on 01/31/2021.   I, Carylon Perches MD MPH, personally performed the services described in this documentation, as scribed by Scharlene Gloss in my presence on 01/31/21 and it is accurate, complete, and reviewed by me.    Carylon Perches MD  [facility],  Neurodevelopment and Neuropalliative care [facility]  [address] [place], [place], [place] [address] Phone: [phone] Fax: [phone]

## 2021-01-29 NOTE — Progress Notes (Unsigned)
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   Brief History: Laura Parrish is a young woman with complicated history including Chairi malformation s/p repair, tethered cord s/p repair, feeding intolerance s/p gastrostomy tube, lactose intolerance and alpha gal allergy,  multiple hormonal irregularities including hypothyroidism, chronic headaches, chronic Gi complaints, neuropathic pain in her feet,  and multiple other symptoms concerning for mitochondrial diease although genetic testing and muscle biopsy unrevealing. She is undergoing testing for suspected myasthenia gravis and has responded well to treatment with Mestinon. We also have growing concern for connective tissue disease, however this has not been diagnostically proven either. She now receives fluid infusion of D5 1/2 NS at 250cc/hr for 12 hrs via portacath every 2 weeks for energy failure and related to inability to tolerate enteral fluids, and has been much more stable with these infusions.     Baseline Function: Cognitive -age appropriate responses to questions, attention and comprehension appropriate Neurologic - cognitively normal, has chronic fatigue, has intermittent headaches, chronic leg pain Endocrine - has hypothyroidism and abnormal menses Communication -fluent speech Cardiovascular -normal- port a cath in place to receive medications and fluids as needed (05/27/2018) Vision -normal, however has regular screening Hearing -normal Pulmonary - history of asthma GI - Feeding intolerance with variable diarrhea and constipation, has a feeding tube in place Urinary -normal Motor -normal function, although low endurance.   Guardians/Caregivers: Gerilyn NestleLeesa Mcswain (mother) ph [phone] Calla KicksKerry Dicenzo (father) ph  [phone]   Care Needs/Upcoming Plan: CAP-DA- started 04/29/2021 2:45 PM Dr. Fransico MichaelBrennan   Recent Events Seen by Dr. Roetta SessionsGuo- f/u 2 yrs mom was to bring records 12/2020 GYN: For dysmennorhea I recommend reading [email or web address]. Essential oils: clary sage, lavendar, "vitex" Switching to a plant-based diet and eliminating processed foods, including sugar, may decrease your pain. Making sure you get enough omega-3s, magnesium, vitamin D, vitamin B1 and Vitamin E might help. start tracking your period. Do try ibuprofen 600mg  every 6 hours (or 800 every 8 hrs) 24-48 hour prior to your expected period.For medical treatment, we can try a low dose birth control pill (Junel started) or a progesterone IUD. Both work by stabilizing your hormones so your body isn't cycling and triggering the endometriosis implants that are painful.       Bladder urgency avoid irritants include: caffeine, alcohol, spicy and acidic foods, carbonation, chocolate, cigarette smoking, When you feel the urge to go, stop and consciously do several     rapid Kegel muscle squeezes until the urge goes away. 08/2020 Normal EMG 12/2020 MRI of Brain and Spine in results section   Feeding: Last updated: 07/26/2020 DME:  *changed to [facility] 01/16/2021 - orders faxed [phone] Formula: Ensure Clear Current regimen: Day feeds: PO foods Overnight feeds: 720 mL (3 cans) + 120 ml of water @ 85 mL/hr x 10-11 hours             FWF:             Notes: Pt receives oral foods during the day, but relies of enteral nutrition overnight to meet nutritional needs. Supplements: MVI + iron, Vitamin B2, Co-Q10, and Calcium Goal to add Ensure clear, and take 32oz water in the few days leading up to infusions   Symptom management/Treatments: Neurological - Topiramate for migraine prevention, Maxalt, Ondansetron and Ibuprofen for rescue; Lyrica  for neuropathic pain in her legs; Skelaxin for body pain; Fluid infusions every 2 weeks; needs frequent rest  periods, IV fluids with D10 1/2 NS at 125 ml/hr x 24 hrs or 250 ml/hr x 12 hrs. Endocrine - on Synthroid for hypothyroidism   Pulmonary - Astelin nasal spray, Xyzal, albuterol nebulizer ([employer]) works better than inhalers GI - Cromolyn, Ensure supplements, Miralax for constipation, PPI rabeprazole Musculoskeletal- Mestinon for weakness, muscle patches left neck, Skelaxin TMJ- seeing ortho and dentist Sleep-  Trazadone 25-50 mg per PCP BP- per Dr. Fransico Michael [address] [place] method to maintain BP going from sitting to standing position   Past/failed meds: Periactin caused severe fatigue and failed to control headaches Gabapentin less helpful for neuropathic pain Zomig - ineffective Maxalt - intermittently helpful better than Imitrex Skelaxin helpful at times but causes sleepiness Imitrex- caused dizziness does not like   Providers: Verdene Lennert, MD (PCP) [facility] Internal Medicine)- ph. [phone] Fax [phone] [name], MD [facility] Child Neurology and Pediatric Complex Care) ph [phone] fax [phone] John Giovanni, RD [facility] Pediatric Complex Care dietitian) ph [phone] fax [phone] [name] NP-C [facility] Pediatric Complex Care) ph [phone] fax [phone] [name] Essex, MD [facility] Cardiology) ph [phone] fax [phone] [facility] Surgery-ph. [phone] fax [phone] -portacath and G tube Truitt Merle, MD [facility] Gastroenterology) ph. [phone] [phone] Dennie Fetters MD ([facility]) ph. [phone] fax [phone] Molli Knock, MD [facility] Pediatric Endocrinology) ph [phone] fax [phone] [facility] Dermatology Ph [phone] Fax- [phone]. Christeen Douglas, MD [facility] Health Gynecology) ph [phone] fax [phone] (referred - not yet seen) Vira Blanco, MD [facility] Otolaryngology) ph. [phone] Fax [phone] Pollyann Savoy, MD (Rheumatology) ph. [phone] fax [phone] Loletha Grayer, Drucilla Schmidt [facility] Pediatric Genetics) ph. [phone]   Community support/services: [facility] -[phone] fax [phone] home health nursing by [name]; portacath and infusion supplies [facility]: Caprice Renshaw- [email or web address] ph. [phone] [facility]   Equipment: [facility]: [phone] fax: [phone]-  Port A Cath supplies, CADD pump for IV fluids,  Pulse ox, IV fluids  [employer]: ph. [phone] fax: [phone] wheelchair, hospital bed [facility]: orders faxed 01/16/2021 ph. [phone] fax [phone] for enteral feeding & supplies   Goals of care: Attempting all typical milestones of adulthood, with modifications for activity level   Advanced care planning: Full Code   Psychosocial: Lives with parents and 2 siblings, has driver's license, attends community college part time   Diagnostics/Screenings: 2010 EEG: Normal PSG normal 11/22/09 SSEP These lower extremity somatosensory evoked potentials are at least borderline abnormal for an increased absolute latency of the P37 responses and the L1 - P37 interpeak latencies with both left and right posterior tibial nerve stimulation. 06/16/2019- Field Vision Test- normal, Normal inner and outer retina OU 06/23/2019 EMG test Brenners-I Diminished fibular motor responses possibly due to hx of tethered cord, chronic bracing and is a nonspecific finding but no evidence of myopathy or generalized neuropathy. 11/01/2019 Otolaryngology: TMJ, Audiology exam                   LEFT ear: consistent with normal hearing. Speech testing gave an SRT of 5 dBHL and WRS of 100% presented at 50 dBHL,                   Right Ear: consistent with a mild sensorineural hearing loss at 1K.  Speech testing gave an SRT of 10 dBHL and WRS of 100% presented  at 50 dBHL 12/01/2019 Thyroid Ultrasound IMPRESSION: Small  heterogenous thyroid. No indication for biopsy or dedicated imaging follow-up. 12/19/2019 Pelvic Ultrasound:Trace free fluid in the pelvis. Otherwise negative pelvic ultrasound. 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 12/18/2020 MRI of Brain and cervical Spine: Unremarkable appearance of the brain following Chiari I decompression. 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 Prior Chiari decompression   [name] NP-C and [name], MD Pediatric Complex Care Program Ph: [phone] Fax: [phone]

## 2021-01-31 ENCOUNTER — Encounter (INDEPENDENT_AMBULATORY_CARE_PROVIDER_SITE_OTHER): Payer: Self-pay | Admitting: Pediatrics

## 2021-01-31 ENCOUNTER — Other Ambulatory Visit: Payer: Self-pay

## 2021-01-31 ENCOUNTER — Ambulatory Visit (INDEPENDENT_AMBULATORY_CARE_PROVIDER_SITE_OTHER): Payer: PRIVATE HEALTH INSURANCE | Admitting: Pediatrics

## 2021-01-31 ENCOUNTER — Ambulatory Visit (INDEPENDENT_AMBULATORY_CARE_PROVIDER_SITE_OTHER): Payer: PRIVATE HEALTH INSURANCE | Admitting: Dietician

## 2021-01-31 ENCOUNTER — Encounter (INDEPENDENT_AMBULATORY_CARE_PROVIDER_SITE_OTHER): Payer: Self-pay | Admitting: Dietician

## 2021-01-31 ENCOUNTER — Ambulatory Visit (INDEPENDENT_AMBULATORY_CARE_PROVIDER_SITE_OTHER): Payer: PRIVATE HEALTH INSURANCE

## 2021-01-31 VITALS — BP 110/72 | HR 72 | Resp 20 | Ht 63.78 in | Wt 125.2 lb

## 2021-01-31 DIAGNOSIS — G901 Familial dysautonomia [Riley-Day]: Secondary | ICD-10-CM

## 2021-01-31 DIAGNOSIS — E8840 Mitochondrial metabolism disorder, unspecified: Secondary | ICD-10-CM

## 2021-01-31 DIAGNOSIS — G43009 Migraine without aura, not intractable, without status migrainosus: Secondary | ICD-10-CM

## 2021-01-31 DIAGNOSIS — Z931 Gastrostomy status: Secondary | ICD-10-CM | POA: Diagnosis not present

## 2021-01-31 DIAGNOSIS — G935 Compression of brain: Secondary | ICD-10-CM

## 2021-01-31 DIAGNOSIS — Z9189 Other specified personal risk factors, not elsewhere classified: Secondary | ICD-10-CM

## 2021-01-31 DIAGNOSIS — M545 Low back pain, unspecified: Secondary | ICD-10-CM | POA: Diagnosis not present

## 2021-01-31 DIAGNOSIS — E860 Dehydration: Secondary | ICD-10-CM

## 2021-01-31 DIAGNOSIS — M792 Neuralgia and neuritis, unspecified: Secondary | ICD-10-CM

## 2021-01-31 DIAGNOSIS — G8929 Other chronic pain: Secondary | ICD-10-CM

## 2021-01-31 DIAGNOSIS — G7000 Myasthenia gravis without (acute) exacerbation: Secondary | ICD-10-CM

## 2021-01-31 DIAGNOSIS — Z7189 Other specified counseling: Secondary | ICD-10-CM

## 2021-01-31 DIAGNOSIS — G950 Syringomyelia and syringobulbia: Secondary | ICD-10-CM

## 2021-01-31 MED ORDER — NUTRITIONAL SUPPLEMENT PLUS PO LIQD: ORAL | 12 refills | Status: AC

## 2021-01-31 NOTE — Patient Instructions (Addendum)
Nutrition Recommendations: - I will put in an order for 1-2 boost breeze (peach flavor) daily.  - Continue current regimen.  - Continue eating a wide variety of all food groups (fruits, vegetables, whole grains, lean proteins, low-fat dairy.

## 2021-01-31 NOTE — Patient Instructions (Addendum)
Increased the skelaxin night dosage for back pain today, take up to 1 tablet Recommend acupuncture, water therapy, and massage. Chiropractor is okay as long as there is no twisting or high velocity movement.  Continue all other medications Schedule an appointment with Dr. Corliss Skains to check on her labs  Schedule with Dr. Sheliah Hatch for a g-tube change Placed order for tens machine and electric wheelchair today  It was a pleasure to see you in clinic today.    Feel free to contact our office during normal business hours at [phone] with questions or concerns. If there is no answer or the call is outside business hours, please leave a message and our clinic staff will call you back within the next business day.  If you have an urgent concern, please stay on the line for our after-hours answering service and ask for the on-call neurologist.    I also encourage you to use MyChart to communicate with me more directly. If you have not yet signed up for MyChart within [facility], the front desk staff can help you. However, please note that this inbox is NOT monitored on nights or weekends, and response can take up to 2 business days.  Urgent matters should be discussed with the on-call pediatric neurologist.   At [facility], we are committed to providing exceptional care. You will receive a patient satisfaction survey through text or email regarding your visit today. Your opinion is important to me. Comments are appreciated.

## 2021-01-31 NOTE — Progress Notes (Signed)
RD faxed orders for 1-2 Boost Breeze (peach flavor only) to [place] @ [phone].

## 2021-02-01 ENCOUNTER — Telehealth (INDEPENDENT_AMBULATORY_CARE_PROVIDER_SITE_OTHER): Payer: Self-pay | Admitting: Family

## 2021-02-01 NOTE — Telephone Encounter (Signed)
°  Who's calling (name and relationship to patient) : ZOXWRUE; [facility]  Best contact number: [phone]  Provider they see: Blane Ohara  Reason for call: [facility] stated that an order was sent over for Saira, and she faxed over for the need for necessity for the formula.     PRESCRIPTION REFILL ONLY  Name of prescription:  Pharmacy:

## 2021-02-01 NOTE — Telephone Encounter (Signed)
Letter if medical necessity faxed to [facility] at [phone].

## 2021-02-11 ENCOUNTER — Encounter (INDEPENDENT_AMBULATORY_CARE_PROVIDER_SITE_OTHER): Payer: Self-pay | Admitting: Pediatrics

## 2021-02-12 ENCOUNTER — Other Ambulatory Visit (INDEPENDENT_AMBULATORY_CARE_PROVIDER_SITE_OTHER): Payer: Self-pay | Admitting: Family

## 2021-02-12 DIAGNOSIS — Z931 Gastrostomy status: Secondary | ICD-10-CM

## 2021-02-13 ENCOUNTER — Other Ambulatory Visit (INDEPENDENT_AMBULATORY_CARE_PROVIDER_SITE_OTHER): Payer: Self-pay | Admitting: Family

## 2021-02-13 ENCOUNTER — Telehealth (INDEPENDENT_AMBULATORY_CARE_PROVIDER_SITE_OTHER): Payer: Self-pay | Admitting: Family

## 2021-02-13 DIAGNOSIS — E8840 Mitochondrial metabolism disorder, unspecified: Secondary | ICD-10-CM

## 2021-02-13 DIAGNOSIS — G8929 Other chronic pain: Secondary | ICD-10-CM

## 2021-02-13 DIAGNOSIS — G935 Compression of brain: Secondary | ICD-10-CM

## 2021-02-13 DIAGNOSIS — R5382 Chronic fatigue, unspecified: Secondary | ICD-10-CM

## 2021-02-13 NOTE — Telephone Encounter (Signed)
°  Who's calling (name and relationship to patient) : Edd Fabian  Best contact number: [phone] Provider they see: Blane Ohara Reason for call:  [facility] does not carry silver nitrat sticks that are being requested for patient    PRESCRIPTION REFILL ONLY  Name of prescription:  Pharmacy:

## 2021-02-13 NOTE — Telephone Encounter (Signed)
Noted. I called the patient's home health nurse to let her know. TG

## 2021-02-25 ENCOUNTER — Other Ambulatory Visit (INDEPENDENT_AMBULATORY_CARE_PROVIDER_SITE_OTHER): Payer: Self-pay | Admitting: Pediatrics

## 2021-02-25 DIAGNOSIS — G43009 Migraine without aura, not intractable, without status migrainosus: Secondary | ICD-10-CM

## 2021-02-27 ENCOUNTER — Encounter (INDEPENDENT_AMBULATORY_CARE_PROVIDER_SITE_OTHER): Payer: Self-pay

## 2021-02-27 ENCOUNTER — Telehealth (INDEPENDENT_AMBULATORY_CARE_PROVIDER_SITE_OTHER): Payer: Self-pay | Admitting: Family

## 2021-02-27 DIAGNOSIS — R638 Other symptoms and signs concerning food and fluid intake: Secondary | ICD-10-CM

## 2021-02-27 DIAGNOSIS — E8840 Mitochondrial metabolism disorder, unspecified: Secondary | ICD-10-CM

## 2021-02-27 DIAGNOSIS — G901 Familial dysautonomia [Riley-Day]: Secondary | ICD-10-CM

## 2021-02-27 NOTE — Telephone Encounter (Signed)
Mom called to report that [name] has not been well since last Friday. She has had headache, increased fatigue, respiratory congestion. Covid test is negative. The portacath was accessed today and Mom asked for orders for sick day IV fluids. I recommended D5 1/2 NS at 116ml/hr x 12 hrs for the next 3-5 days. I asked Mom to keep me posted on how Laura Parrish is doing.  She also needs a note for missing school since Friday. I will send the note via MyChart. TG

## 2021-03-12 ENCOUNTER — Encounter (INDEPENDENT_AMBULATORY_CARE_PROVIDER_SITE_OTHER): Payer: Self-pay

## 2021-04-08 ENCOUNTER — Other Ambulatory Visit (INDEPENDENT_AMBULATORY_CARE_PROVIDER_SITE_OTHER): Payer: Self-pay | Admitting: Pediatrics

## 2021-04-08 ENCOUNTER — Other Ambulatory Visit (INDEPENDENT_AMBULATORY_CARE_PROVIDER_SITE_OTHER): Payer: Self-pay | Admitting: "Endocrinology

## 2021-04-08 DIAGNOSIS — E063 Autoimmune thyroiditis: Secondary | ICD-10-CM

## 2021-04-08 DIAGNOSIS — G43009 Migraine without aura, not intractable, without status migrainosus: Secondary | ICD-10-CM

## 2021-04-22 ENCOUNTER — Ambulatory Visit (INDEPENDENT_AMBULATORY_CARE_PROVIDER_SITE_OTHER): Payer: PRIVATE HEALTH INSURANCE | Admitting: "Endocrinology

## 2021-04-25 ENCOUNTER — Ambulatory Visit (INDEPENDENT_AMBULATORY_CARE_PROVIDER_SITE_OTHER): Payer: PRIVATE HEALTH INSURANCE | Admitting: Dietician

## 2021-04-29 ENCOUNTER — Ambulatory Visit (INDEPENDENT_AMBULATORY_CARE_PROVIDER_SITE_OTHER): Payer: PRIVATE HEALTH INSURANCE | Admitting: "Endocrinology

## 2021-04-30 ENCOUNTER — Telehealth (INDEPENDENT_AMBULATORY_CARE_PROVIDER_SITE_OTHER): Payer: Self-pay

## 2021-04-30 NOTE — Telephone Encounter (Signed)
Received fax's from [facility] stating PA's needed for [employer] and S[employer]. Initiated these on NCTracks: ? ?Transmitter: ? ? ?Sensors: ? ?

## 2021-05-01 NOTE — Telephone Encounter (Signed)
Transmitter APPROVED thru 04/30/2022: ? ? ? ?Sensors APPROVED thru 04/30/2022: ? ?

## 2021-05-16 ENCOUNTER — Other Ambulatory Visit (INDEPENDENT_AMBULATORY_CARE_PROVIDER_SITE_OTHER): Payer: Self-pay | Admitting: Family

## 2021-05-16 ENCOUNTER — Ambulatory Visit (INDEPENDENT_AMBULATORY_CARE_PROVIDER_SITE_OTHER): Payer: PRIVATE HEALTH INSURANCE

## 2021-05-16 ENCOUNTER — Ambulatory Visit
Admission: RE | Admit: 2021-05-16 | Discharge: 2021-05-16 | Disposition: A | Discharge status: Home / Self Care | Payer: PRIVATE HEALTH INSURANCE | Source: Ambulatory Visit | Attending: Urgent Care | Admitting: Urgent Care

## 2021-05-16 VITALS — BP 120/79 | HR 100 | Temp 98.7°F | Resp 18

## 2021-05-16 DIAGNOSIS — R0602 Shortness of breath: Secondary | ICD-10-CM

## 2021-05-16 DIAGNOSIS — R0981 Nasal congestion: Secondary | ICD-10-CM | POA: Diagnosis not present

## 2021-05-16 DIAGNOSIS — R0989 Other specified symptoms and signs involving the circulatory and respiratory systems: Secondary | ICD-10-CM

## 2021-05-16 DIAGNOSIS — R059 Cough, unspecified: Secondary | ICD-10-CM

## 2021-05-16 DIAGNOSIS — J309 Allergic rhinitis, unspecified: Secondary | ICD-10-CM

## 2021-05-16 DIAGNOSIS — J209 Acute bronchitis, unspecified: Secondary | ICD-10-CM

## 2021-05-16 DIAGNOSIS — J3489 Other specified disorders of nose and nasal sinuses: Secondary | ICD-10-CM

## 2021-05-16 DIAGNOSIS — R519 Headache, unspecified: Secondary | ICD-10-CM

## 2021-05-16 DIAGNOSIS — R051 Acute cough: Secondary | ICD-10-CM

## 2021-05-16 DIAGNOSIS — J4530 Mild persistent asthma, uncomplicated: Secondary | ICD-10-CM

## 2021-05-16 IMAGING — DX DG CHEST 2V
2 series · 2 of 2 positions shown · non-contrast
Comparison: [DATE]

CLINICAL DATA: Cough, cough, congestion, shortness of breath,
fever.

EXAM:
CHEST - 2 VIEW

[chest pa]
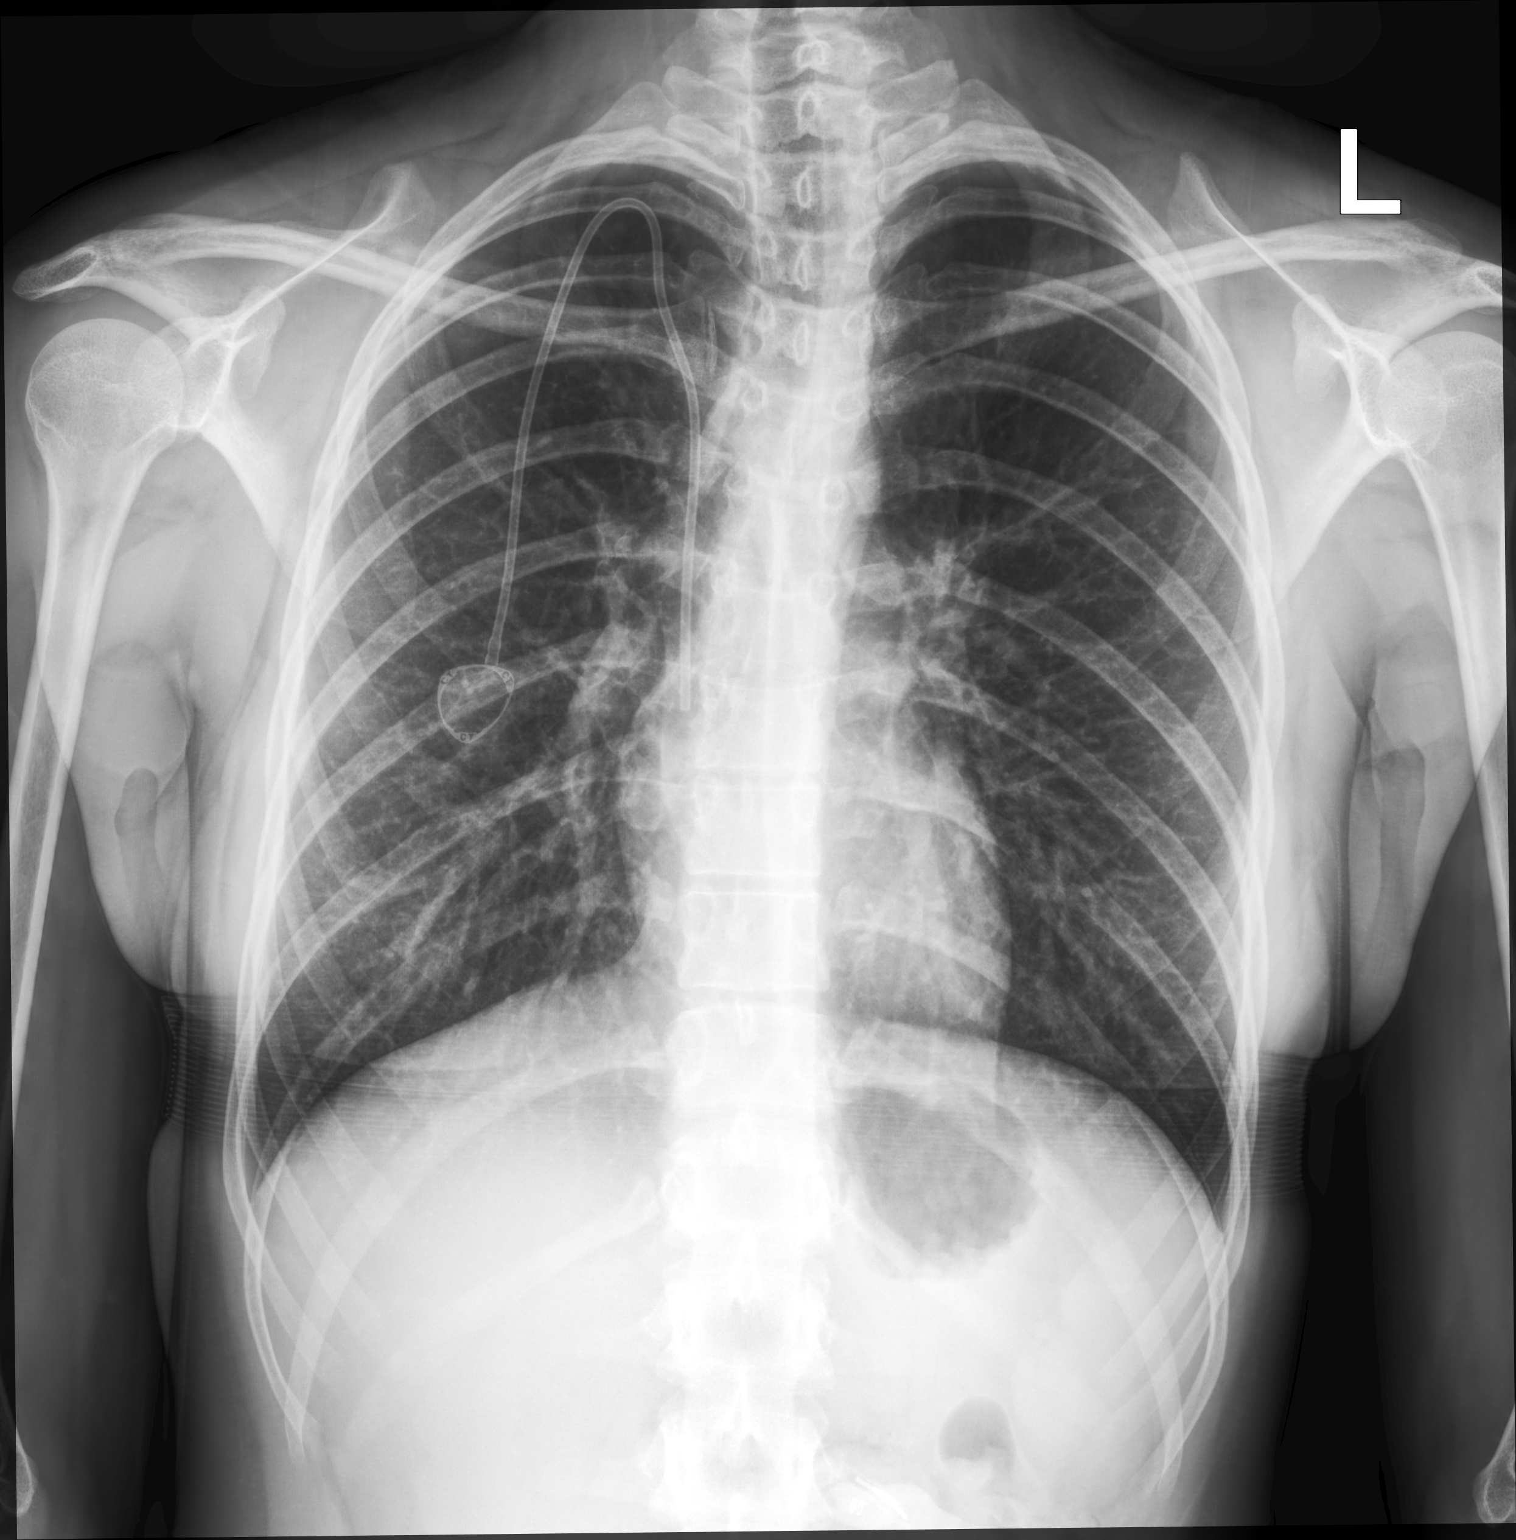

[chest lat]
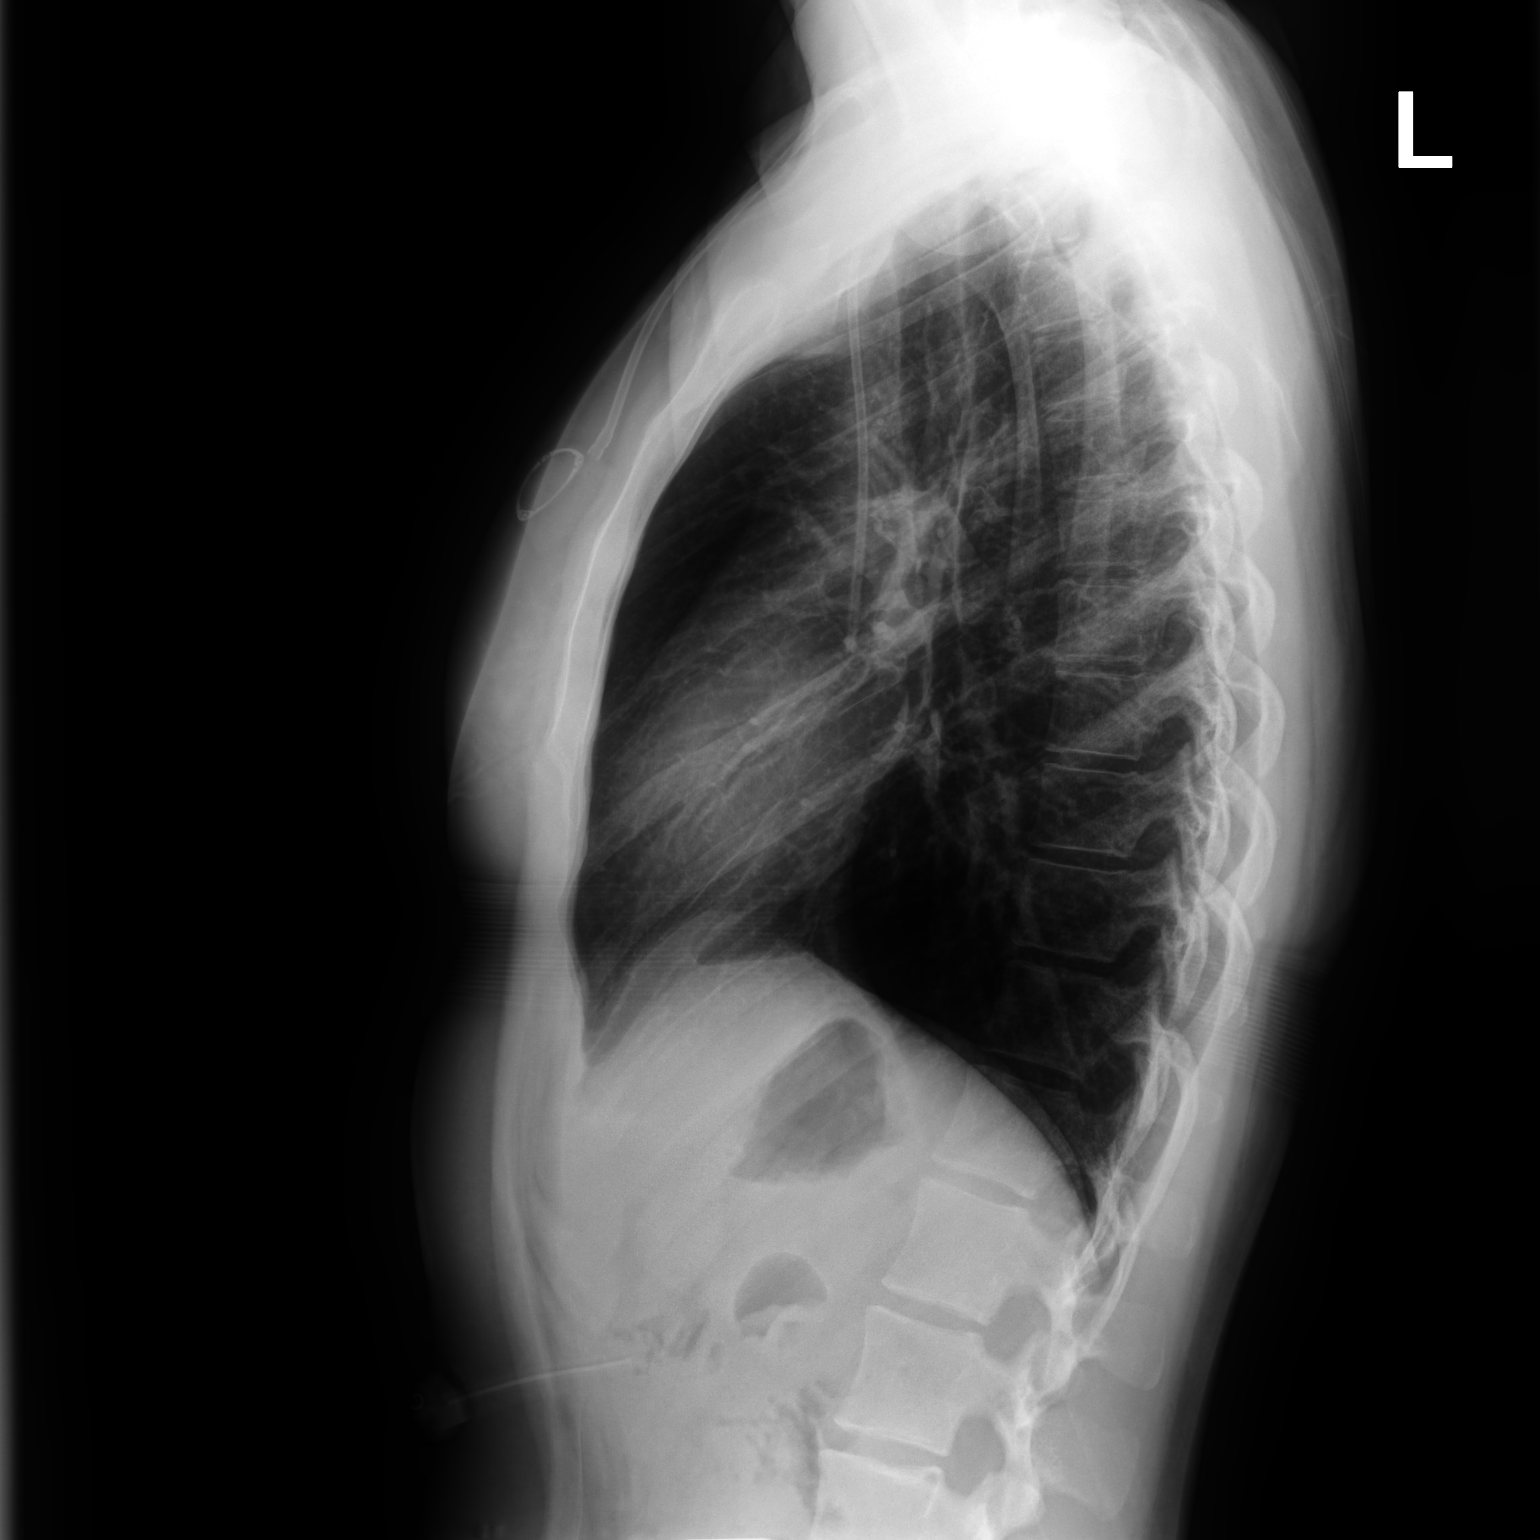

[2 of 2 positions shown; findings below may reference images not displayed]

FINDINGS: Grossly unchanged cardiac silhouette and mediastinal contours.
Stable positioning of support apparatus. Minimal perihilar
interstitial thickening. No discrete focal airspace opacities. No
pleural effusion or pneumothorax. No evidence of edema. No acute
osseous abnormalities.
IMPRESSION: Findings suggestive of airways disease / bronchitis. No focal
airspace opacities to suggest pneumonia.

## 2021-05-16 MED ORDER — PROMETHAZINE-DM 6.25-15 MG/5ML PO SYRP: 2.5000 mL | ORAL_SOLUTION | Freq: Four times a day (QID) | ORAL | 0 refills | Status: DC | PRN

## 2021-05-16 MED ORDER — PREDNISONE 20 MG PO TABS: ORAL_TABLET | ORAL | 0 refills | Status: DC

## 2021-05-16 MED ORDER — ALBUTEROL SULFATE HFA 108 (90 BASE) MCG/ACT IN AERS: 1.0000 | INHALATION_SPRAY | RESPIRATORY_TRACT | 0 refills | Status: AC | PRN

## 2021-05-16 MED ORDER — ALBUTEROL SULFATE 0.63 MG/3ML IN NEBU: INHALATION_SOLUTION | RESPIRATORY_TRACT | 5 refills | Status: DC

## 2021-05-16 NOTE — ED Triage Notes (Signed)
Pt c/o sore throat, cough, nasal congestion, ear pressure, headache, nausea, body aches  ? ?Denies fevers, chills ? ?Onset ~ Monday night  ?

## 2021-05-16 NOTE — ED Provider Notes (Signed)
?[facility] ? ? ?MRN: [id number] DOB: 18-Aug-2000 ? ?Subjective:  ? ?Laura Parrish is a 21 y.o. 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 Has also had a cough that elicits chest pain. Has allergic rhinitis, takes Xyzal daily and has been using Sudafed too. Has had to use her albuterol inhaler for her reactive airway. No official diagnosis of asthma. No smoking. No know sick contacts.  Patient's mother reports that she has previously done well with steroids. ? ?No current facility-administered medications for this encounter. ? ?Current Outpatient Medications:  ?  albuterol (ACCUNEB) 0.63 MG/3ML nebulizer solution, Take 3ml by nebulizer 3 times per day as needed for shortness of breath (Patient not taking: Reported on 12/27/2020), Disp: 270 mL, Rfl: 5 ?  albuterol (PROVENTIL HFA) 108 (90 Base) MCG/ACT inhaler, Inhale 2 puffs into the lungs every 4 (four) hours as needed for wheezing or shortness of breath., Disp: 6.7 g, Rfl: 2 ?  ascorbic acid (VITAMIN C) 500 MG tablet, Take 500 mg by mouth daily. , Disp: , Rfl:  ?  azelastine (ASTELIN) 0.1 % nasal spray, Place 2 sprays into both nostrils 2 (two) times daily. Use in each nostril as directed, Disp: 30 mL, Rfl: 5 ?  clobetasol (OLUX) 0.05 % topical foam, Apply topically 2 (two) times daily. Apply to affected areas twice daily as needed taking care to avoid axillae and groin area., Disp: 45 g, Rfl: 3 ?  Coenzyme Q-10 200 MG CAPS, Take 1 capsule by mouth daily., Disp: ,  Rfl:  ?  Continuous Blood Gluc Receiver (DEXCOM G6 RECEIVER) DEVI, 1 Device by Does not apply route as directed., Disp: 1 each, Rfl: 2 ?  Continuous Blood Gluc Sensor (DEXCOM G6 SENSOR) MISC, Inject 1 applicator into the skin as directed. (change sensor every 10 days), Disp: 3 each, Rfl: 11 ?  Continuous Blood Gluc Sensor (FREESTYLE LIBRE 2 SENSOR) MISC, Inject 1 Device into the skin every 14 (fourteen) days. (Patient not taking: Reported on 01/31/2021), Disp: 2 each, Rfl: 11 ?  Continuous Blood Gluc Transmit (DEXCOM G6 TRANSMITTER) MISC, Inject 1 Device into the skin as directed. (re-use up to 8x with each new sensor), Disp: 1 each, Rfl: 3 ?  EPINEPHrine 0.3 mg/0.3 mL IJ SOAJ injection, Inject 0.3 mg into the muscle once. As needed (Patient not taking: Reported on 12/27/2020), Disp: , Rfl:  ?  Glucagon (BAQSIMI TWO PACK) 3 MG/DOSE POWD, Place 1 spray into the nose as directed. (Patient not taking: Reported on 12/27/2020), Disp: 2 each, Rfl: 3 ?  HAILEY FE 1/20 1-20 MG-MCG tablet, Take 1 tablet by mouth daily., Disp: , Rfl:  ?  leucovorin (WELLCOVORIN) 25 MG tablet, TAKE 1 TABLET BY MOUTH 2 TIMES A DAY, Disp: 180 tablet, Rfl: 1 ?  levOCARNitine (CARNITOR) 1 GM/10ML solution, TAKE 2 TEASPOONFUL (10ML) BY MOUTH TWICEDAILY (Patient taking differently: Place 1,000 mg into feeding tube daily.), Disp: 118 mL, Rfl: 12 ?  lidocaine (XYLOCAINE) 5 % ointment, Apply 1 application topically as needed., Disp: 35.44 g, Rfl: 3 ?  metaxalone (SKELAXIN) 800 MG tablet, TAKE 1/2 TABLET (400MG  TOTAL) BY MOUTH DAILY AS NEEDED (MUSCLE PAIN), Disp:  30 tablet, Rfl: 3 ?  norethindrone-ethinyl estradiol (LOESTRIN) 1-20 MG-MCG tablet, Take by mouth. (Patient not taking: Reported on 01/31/2021), Disp: , Rfl:  ?  Nutritional Supplements (NUTRITIONAL SUPPLEMENT PLUS) LIQD, 3 cartons (711 mL) of Ensure Clear given via gtube daily., Disp: 22041 mL, Rfl: 12 ?  Nutritional Supplements (NUTRITIONAL SUPPLEMENT PLUS) LIQD, 237-474 mL (1-2 cartons) Boost  Breeze (peach flavored only) given po daily., Disp: 14694 mL, Rfl: 12 ?  ondansetron (ZOFRAN-ODT) 4 MG disintegrating tablet, Take 1 tablet under the tongue every 6-8 hours as needed for nausea., Disp: 30 tablet, Rfl: 5 ?  pregabalin (LYRICA) 25 MG capsule, Take 1 capsule (25 mg total) by mouth 2 (two) times daily., Disp: 60 capsule, Rfl: 5 ?  pregabalin (LYRICA) 75 MG capsule, TAKE 2 CAPSULES BY MOUTH AT BEDTIME, Disp: 60 capsule, Rfl: 5 ?  PRESCRIPTION MEDICATION, Apply 1 application topically as needed (port). Elma- cream , Disp: , Rfl:  ?  promethazine (PHENERGAN) 25 MG tablet, Take 1 tablet at onset of migraine. May repeat every 6 hours PRN, Disp: 30 tablet, Rfl: 0 ?  pyridostigmine (MESTINON) 60 MG tablet, Take 1 tablet (60 mg total) by mouth in the morning, at noon, in the evening, and at bedtime., Disp: 120 tablet, Rfl: 5 ?  RABEprazole (ACIPHEX) 20 MG tablet, TAKE ONE TABLET TWICE DAILY., Disp: 60 tablet, Rfl: 6 ?  rizatriptan (MAXALT) 10 MG tablet, TAKE 1 TABLET BY MOUTH AS NEEDED FOR MIGRAINE. MAY REPEAT IN 2 HOURS IF NEEDED., Disp: 12 tablet, Rfl: 3 ?  SYNTHROID 88 MCG tablet, TAKE 1 TABLET BY MOUTH EVERY MORNING ON AN EMPTY STOMACH, Disp: 90 tablet, Rfl: 1 ?  topiramate (TOPAMAX) 25 MG tablet, TAKE 1 TABLET BY MOUTH EVERY NIGHT AT BEDTIME, Disp: 30 tablet, Rfl: 5 ?  Topiramate ER (TROKENDI XR) 50 MG CP24, TAKE 1 CAPSULE BY MOUTH DAILY, Disp: 30 capsule, Rfl: 5 ?  traZODone (DESYREL) 50 MG tablet, TAKE 1/2 TABLET BY MOUTH 30 MINUTES PRIOR TO BEDTIME. IF NEEDED, MAY INCREASE TO 1 TABLET BEFORE BEDTIME, Disp: 30 tablet, Rfl: 1 ?  vitamin E 200 UNIT capsule, Take 200 Units by mouth daily., Disp: , Rfl:   ? ?Allergies  ?Allergen Reactions  ? Other Anaphylaxis  ?  Alpha Gal allergy \ Red meat\ any hoof animal  ? Latex Rash  ? Lactated Ringers   ?  Contraindicated w/ mitochondrial syndrome  ? Propofol Other (See Comments)  ?  Due to possible mitochondrial disorder ?Due to possible mitochondrial disorder ?Due  to possible mitochondrial disorder  ? Tape Itching and Other (See Comments)  ? ? ?Past Medical History:  ?Diagnosis Date  ? Allergy to alpha-gal   ? elevated IgE 04/10/16  ? Autonomic dysfunction   ? Chiari I malformation (HCC)   ? Complication of anesthesia   ? slow to wake up  ? Dyspnea   ? Eczema   ? GERD (gastroesophageal reflux disease)   ? Headache   ? Heart murmur   ? Hypoglycemia   ? Hypothyroid   ? IBS (irritable bowel syndrome)   ? Joint pain   ? Mitochondrial disease (HCC)   ? Muscle pain   ? Pneumonia   ? several times  ? PONV (postoperative nausea and vomiting)   ?  ? ?Past Surgical History:  ?Procedure Laterality Date  ? ADENOIDECTOMY    ? 2004or 2005  ? chiari decompression  2007  ? GALLBLADDER SURGERY  2015  ? GASTROSTOMY TUBE PLACEMENT    ?  x 2  ? IR CV LINE INJECTION  07/06/2020  ? MUSCLE BIOPSY  2010  ? Port Replacement  05/2018  ? x 2 orignal 08/2016 then replacement 05/2018  ? PORT-A-CATH REMOVAL N/A 03/24/2019  ? Procedure: PORT-A-CATH REMOVAL;  Surgeon: Sheliah Hatch De Blanch, MD;  Location: [facility] OR;  Service: General;  Laterality: N/A;  ? PORTACATH PLACEMENT Right 03/24/2019  ? Procedure: PORT-A-CATH INSERTION under ultrasound and fluoroscopic guidance;  Surgeon: Kinsinger, De Blanch, MD;  Location: [facility] OR;  Service: General;  Laterality: Right;  ? REMOVAL OF GASTROSTOMY TUBE N/A 03/24/2019  ? Procedure: Exchange of Gastrostomy Tube;  Surgeon: Kinsinger, De Blanch, MD;  Location: [facility] OR;  Service: General;  Laterality: N/A;  ? tethered cord  07/2006  ? TONSILLECTOMY    ? [id number]  ? ? ?Family History  ?Problem Relation Age of Onset  ? Asthma Mother   ? Allergic rhinitis Mother   ? Heart murmur Mother   ? Allergic rhinitis Sister   ? Thyroid disease Sister   ? Allergic rhinitis Brother   ? Asthma Brother   ? Seizures Brother   ? Mitochondrial disorder Brother   ? Allergic rhinitis Maternal Grandmother   ? Asthma Maternal Grandmother   ? Diabetes type II Maternal Grandmother   ? Lung cancer  Maternal Grandfather   ? Angioedema Neg Hx   ? Eczema Neg Hx   ? Immunodeficiency Neg Hx   ? Urticaria Neg Hx   ? ? ?Social History  ? ?Tobacco Use  ? Smoking status: Never  ? Smokeless tobacco: Never  ?Vaping U

## 2021-05-21 ENCOUNTER — Ambulatory Visit
Admission: EM | Admit: 2021-05-21 | Discharge: 2021-05-21 | Disposition: A | Discharge status: Home / Self Care | Payer: PRIVATE HEALTH INSURANCE | Attending: Urgent Care | Admitting: Urgent Care

## 2021-05-21 ENCOUNTER — Ambulatory Visit (INDEPENDENT_AMBULATORY_CARE_PROVIDER_SITE_OTHER): Payer: PRIVATE HEALTH INSURANCE

## 2021-05-21 DIAGNOSIS — R0789 Other chest pain: Secondary | ICD-10-CM

## 2021-05-21 DIAGNOSIS — J22 Unspecified acute lower respiratory infection: Secondary | ICD-10-CM | POA: Diagnosis not present

## 2021-05-21 DIAGNOSIS — R509 Fever, unspecified: Secondary | ICD-10-CM

## 2021-05-21 DIAGNOSIS — R059 Cough, unspecified: Secondary | ICD-10-CM | POA: Diagnosis not present

## 2021-05-21 IMAGING — DX DG CHEST 2V
2 series · 2 of 2 positions shown · non-contrast
Comparison: [DATE]

CLINICAL DATA: Cough, fever, sternal chest pain

EXAM:
CHEST - 2 VIEW

[chest pa]
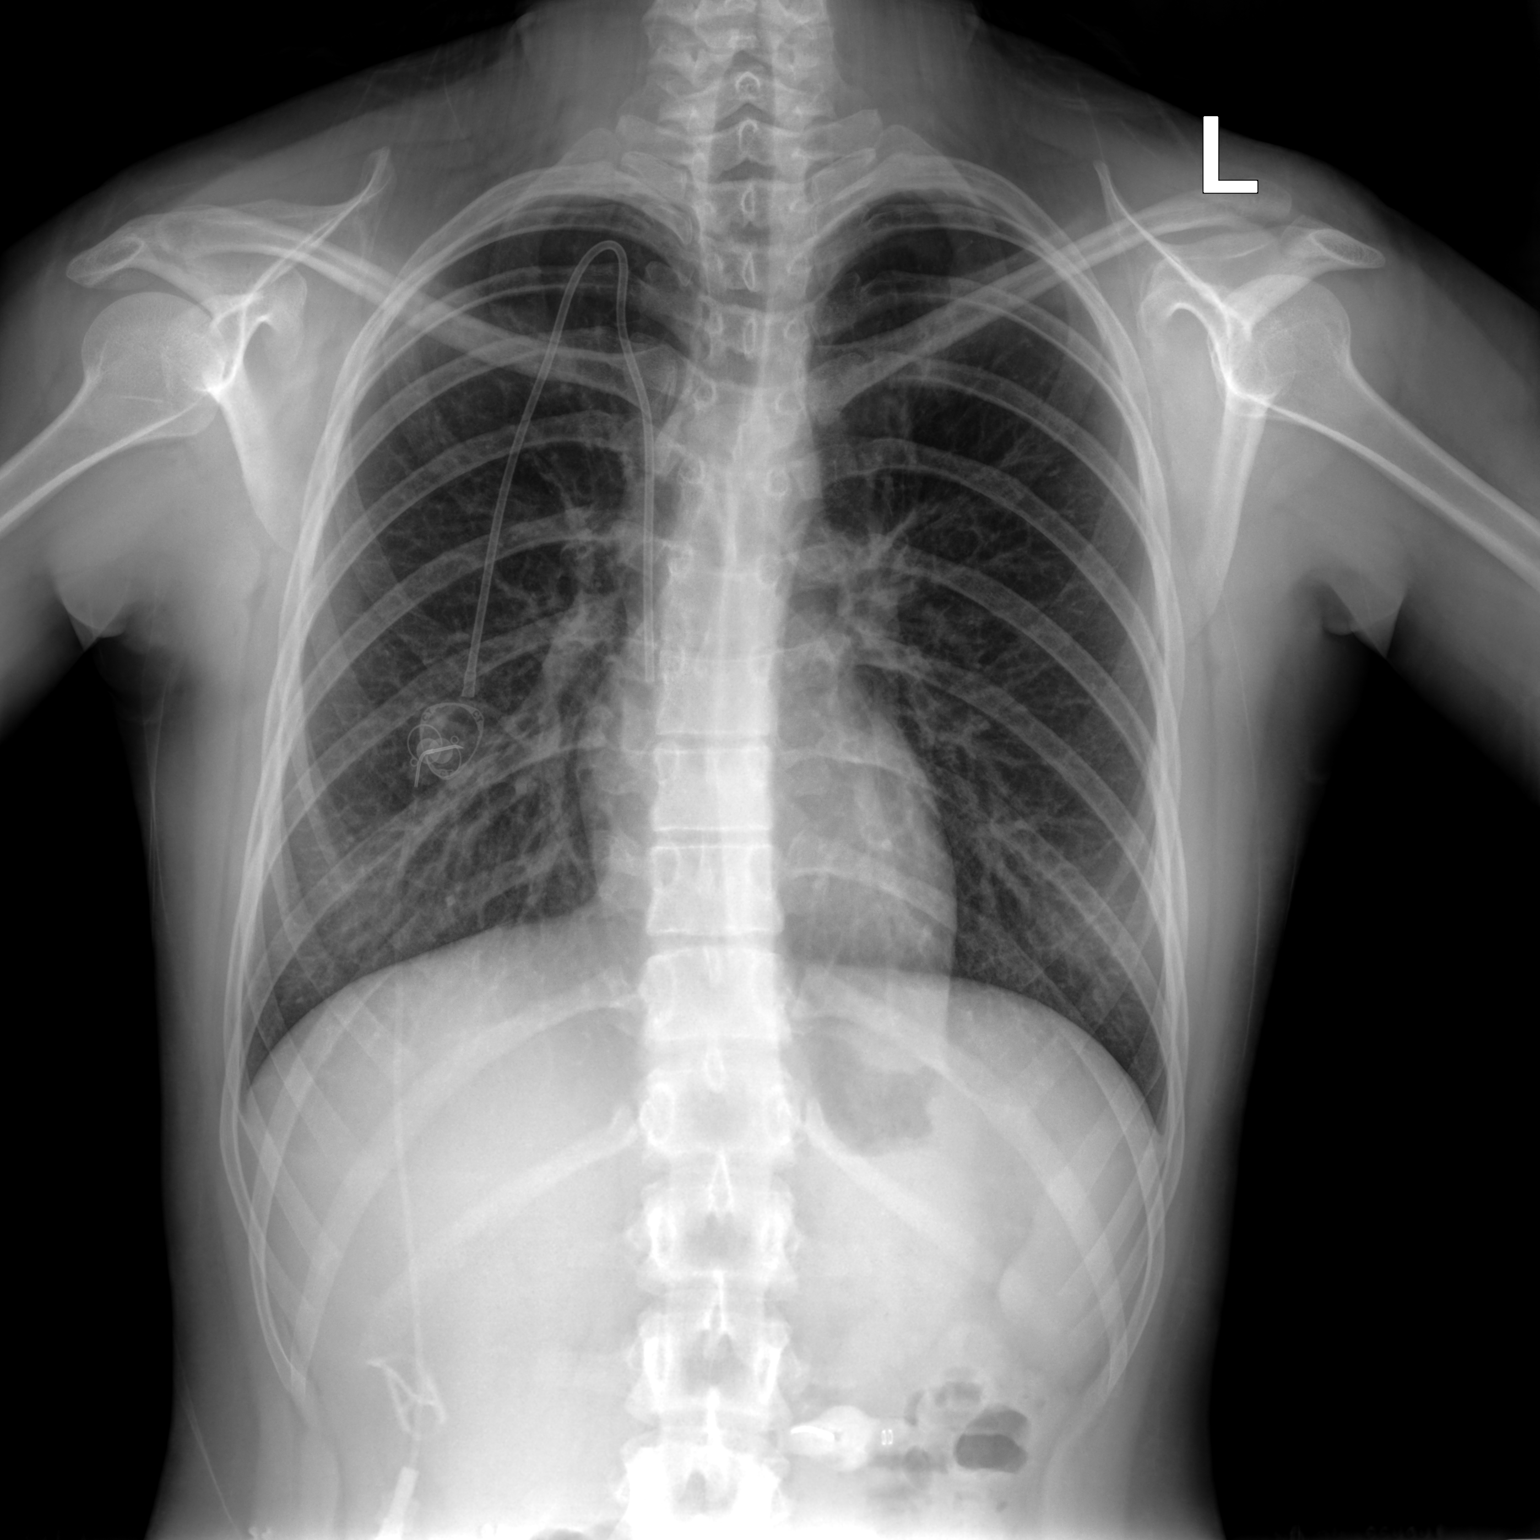

[chest lat]
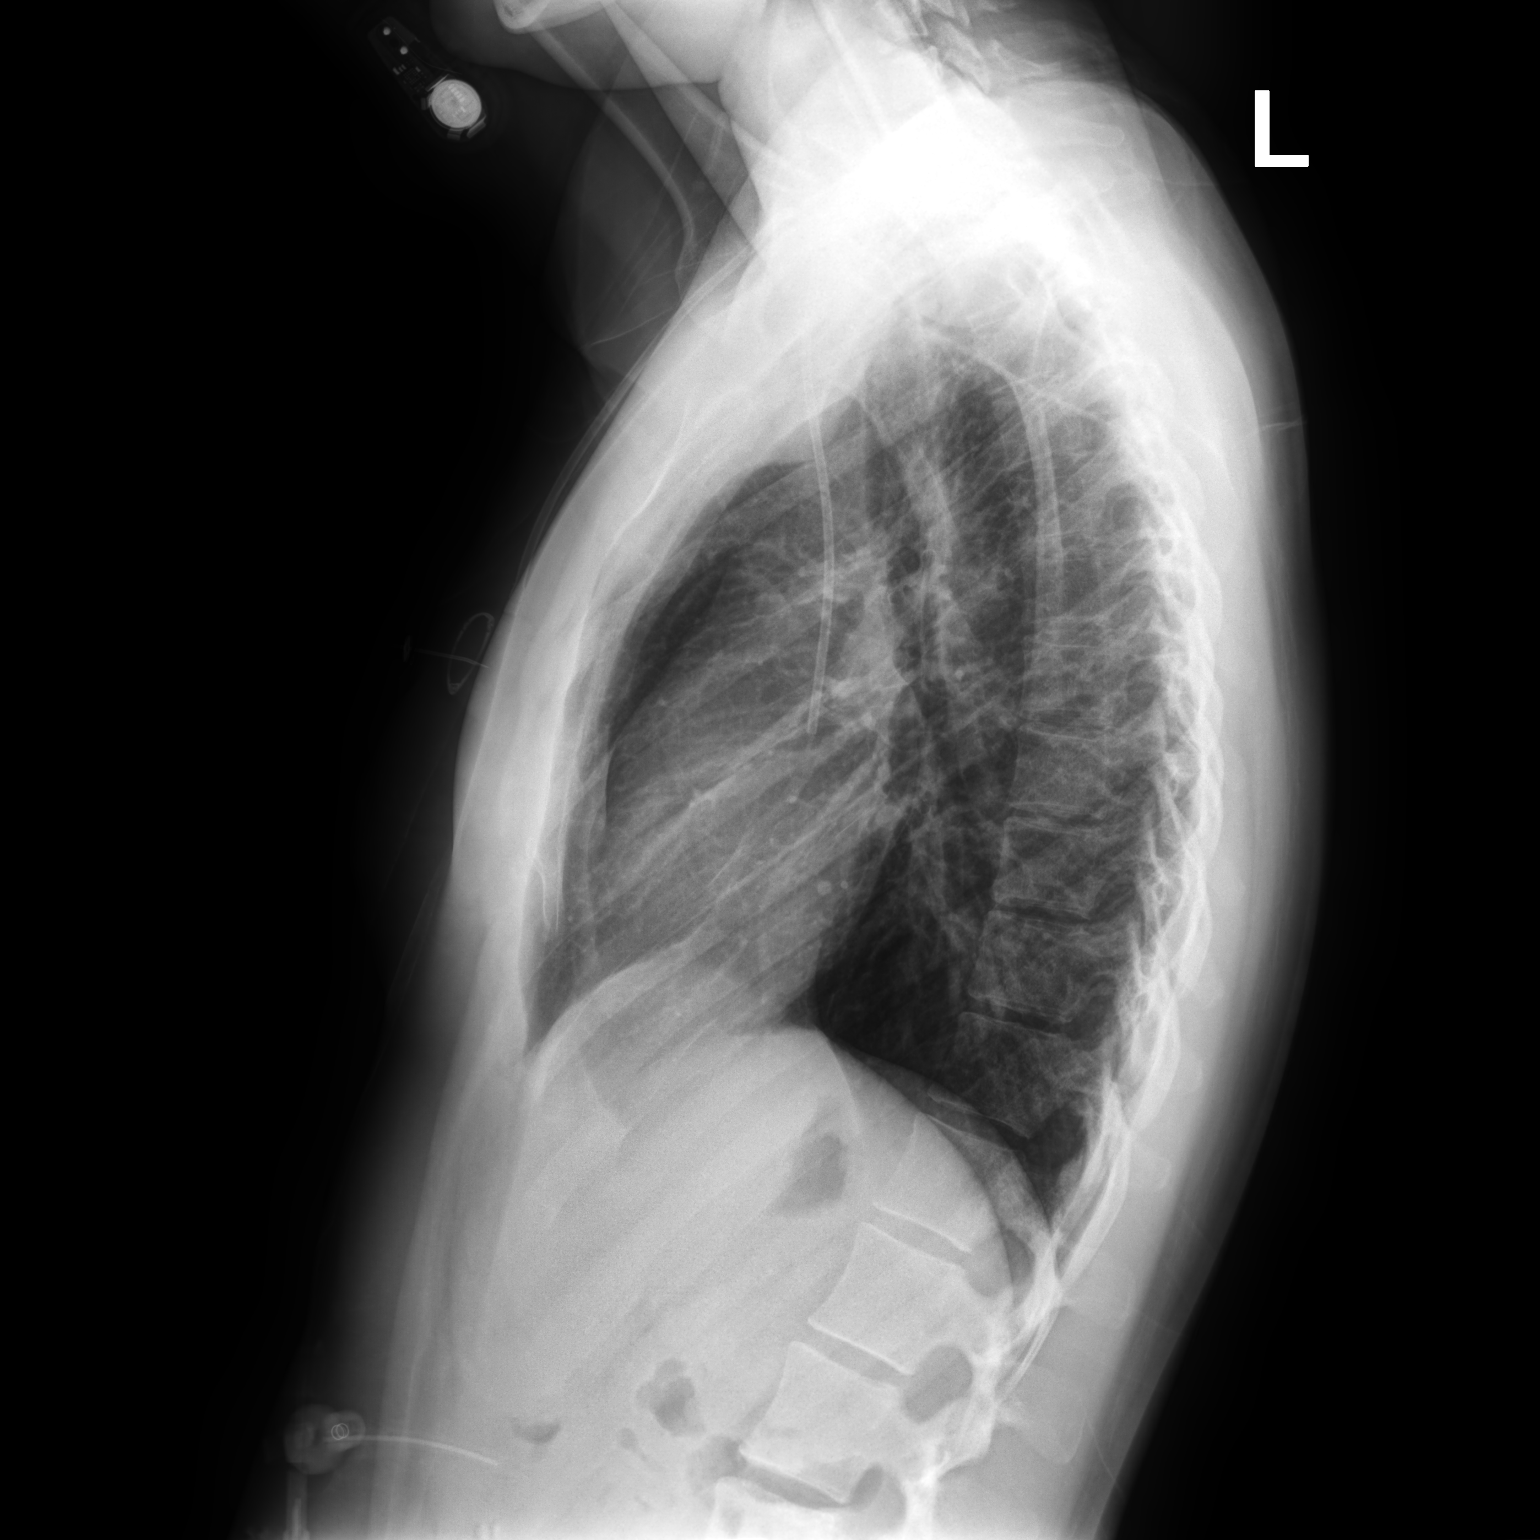

[2 of 2 positions shown; findings below may reference images not displayed]

FINDINGS: Redemonstrated right chest port with catheter tip at the cavoatrial
junction. Unchanged cardiac and mediastinal contours. No focal
pulmonary opacity, pleural effusion, or pneumothorax. Redemonstrated
peribronchial thickening. No acute osseous abnormality.
IMPRESSION: Peribronchial cuffing, which can be seen in the setting of small
airways disease (viral versus reactive). No focal pulmonary opacity
to suggest pneumonia.

## 2021-05-21 MED ORDER — AZITHROMYCIN 250 MG PO TABS
ORAL_TABLET | ORAL | 0 refills | Status: DC
Start: 2021-05-21 — End: 2021-06-13

## 2021-05-21 MED ORDER — MONTELUKAST SODIUM 10 MG PO TABS: 10.0000 mg | ORAL_TABLET | Freq: Every day | ORAL | 0 refills | Status: DC

## 2021-05-21 NOTE — Discharge Instructions (Addendum)
Your chest x-ray is stable compared to last week.  There is no pneumonia visible. ?Given your chronic medical conditions however I am concerned for developing bacterial lower respiratory infection. ?Please take the azithromycin as prescribed. ?Please start the montelukast nightly. ?Please follow-up with your PCP should symptoms persist. ?Warm compresses to your chest may be beneficial, humidification and steam from warm shower may also open up your airways. ?

## 2021-05-21 NOTE — ED Provider Notes (Signed)
?[facility] ? ? ? ?CSN: [id number] ?Arrival date & time: 05/21/21  1256 ? ? ?  ? ?History   ?Chief Complaint ?Chief Complaint  ?Patient presents with  ? Cough  ?  Continued problems from visit last week, home health nurse and nurse practitioner at complex care practice said we need to be seen again and repeat chest x-ray since she is having constant chest pain. - Entered by patient  ? ? ?HPI ?Laura Parrish is a 21 y.o. female.  ? ?Pleasant 21yo female presents today with concerns of continued, and worsening cough, and development of sternal chest pains. She has an extensive medical history, and is currently being evaluated for possible Myasthenia gravis due to hx of dyspnea and weakness. 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 She has a port in place and was supposed to have an infusion today. She did not complete the infusion however given her worsening respiratory issues. She was last seen on 05/16/21 due to cough. At that time, CXR did not reveal any pneumonia, although pt admits to a long-standing hx of this in the past. She took steroids for suspected bronchitis as she has done well with this previously. She remained on all her daily medications. She reports the cough has worsened and chest pressure is not relieved. She spoke with her care coordinator nurse this morning who requested a re-evaluation as this is day 9, and recommended repeat CXR. She remains on daily antihistamines due to her alpha-gal allergy. She denies fever or sinus pain. She has had pneumonia in the past and feels this is becoming a worsening respiratory infection. ? ? ?Cough ? ?Past Medical History:  ?Diagnosis Date  ? Allergy to alpha-gal   ? elevated IgE 04/10/16  ? Autonomic dysfunction   ? Chiari I malformation ([place])   ? Complication of anesthesia   ? slow to wake up  ? Dyspnea   ? Eczema   ? GERD (gastroesophageal reflux disease)   ? Headache   ? Heart murmur   ? Hypoglycemia   ? Hypothyroid   ?  IBS (irritable bowel syndrome)   ? Joint pain   ? 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 Muscle pain   ? Pneumonia   ? several times  ? PONV (postoperative nausea and vomiting)   ? ? ?Patient Active Problem List  ? Diagnosis Date Noted  ? Insomnia 05/31/2020  ? 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 Myasthenia gravis ([place]) 12/02/2019  ? Healthcare maintenance 12/02/2019  ? Raynaud's phenomenon 12/01/2019  ? Menorrhagia 12/01/2019  ? Referred ear pain, bilateral 11/01/2019  ? Feeding by G-tube ([place]) 03/07/2019  ? Thyroiditis, autoimmune 02/06/2018  ? Goiter 02/06/2018  ? Weakness 02/06/2018  ? Hyperprolactinemia ([place]) 02/06/2018  ? Hyperhidrosis of palms and soles 02/06/2018  ? Dehydration 02/06/2018  ? At risk for dehydration 10/12/2017  ? Breakthrough bleeding on birth control pills 06/25/2017  ? Inadequate fluid intake 06/25/2017  ? Ehlers-Danlos syndrome 12/29/2016  ? Snoring 09/28/2016  ? Port-A-Cath in place 09/22/2016  ? Iron deficiency anemia 07/23/2016  ? Health care home, active care coordination 07/23/2016  ? Complex care coordination 07/23/2016  ? Chiari I malformation ([place]) 05/22/2016  ? Chronic pain 05/22/2016  ? 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 Paresthesia 05/22/2016  ? Premature birth 05/22/2016  ? 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  ? Allergy to insect bites and stings 04/10/2016  ? Atopic dermatitis 04/10/2016  ? Contact dermatitis due to  chemicals 04/10/2016  ? Chronic rhinitis 04/10/2016  ? Allergic urticaria 04/10/2016  ? Chronic fatigue 02/15/2015  ? Lactose intolerance 01/27/2013  ? Migraine without aura and without status migrainosus, not intractable 10/01/2012  ? 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 Irregular menstrual cycle 09/29/2011  ? 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 [place] 05/29/2000  ? ? ?Past Surgical History:  ?Procedure Laterality Date  ? ADENOIDECTOMY    ? 2004or 2005  ? chiari decompression  2007  ? GALLBLADDER SURGERY  2015  ?  GASTROSTOMY TUBE PLACEMENT    ? x 2  ? IR CV LINE INJECTION  07/06/2020  ? MUSCLE BIOPSY  2010  ? Port Replacement  05/2018  ? x 2 orignal 08/2016 then replacement 05/2018  ? PORT-A-CATH REMOVAL N/A 03/24/2019  ? Procedure: PORT-A-CATH REMOVAL;  Surgeon: Kieth Brightly Arta Bruce, MD;  Location: [facility];  Service: General;  Laterality: N/A;  ? PORTACATH PLACEMENT Right 03/24/2019  ? Procedure: PORT-A-CATH INSERTION under ultrasound and fluoroscopic guidance;  Surgeon: Kinsinger, Arta Bruce, MD;  Location: [facility];  Service: General;  Laterality: Right;  ? REMOVAL OF GASTROSTOMY TUBE N/A 03/24/2019  ? Procedure: Exchange of Gastrostomy Tube;  Surgeon: Kinsinger, Arta Bruce, MD;  Location: [facility];  Service: General;  Laterality: N/A;  ? tethered cord  07/2006  ? TONSILLECTOMY    ? [id number]  ? ? ?OB History   ?No obstetric history on file. ?  ? ? ? ?Home Medications   ? ?Prior to Admission medications   ?Medication Sig Start Date End Date Taking? Authorizing Provider  ?azithromycin (ZITHROMAX Z-PAK) 250 MG tablet Take 2 pills PO in a single dose today, followed by one pill PO days 2-5 05/21/21  Yes Larinda Herter L, PA  ?montelukast (SINGULAIR) 10 MG tablet Take 1 tablet (10 mg total) by mouth at bedtime. 05/21/21  Yes Cybele Maule L, PA  ?albuterol (ACCUNEB) 0.63 MG/3ML nebulizer solution Take 17ml by nebulizer 3 times per day as needed for shortness of breath 05/16/21   Rockwell Germany, NP  ?albuterol (PROVENTIL HFA) 108 (90 Base) MCG/ACT inhaler Inhale 1-2 puffs into the lungs every 4 (four) hours as needed for wheezing or shortness of breath. 05/16/21 05/16/22  Jaynee Eagles, PA-C  ?ascorbic acid (VITAMIN C) 500 MG tablet Take 500 mg by mouth daily.  02/15/09   [provider]  ?azelastine (ASTELIN) 0.1 % nasal spray Place 2 sprays into both nostrils 2 (two) times daily. Use in each nostril as directed 04/10/16   Bobbitt, Sedalia Muta, MD  ?clobetasol (OLUX) 0.05 % topical foam Apply topically 2 (two) times daily. Apply to  affected areas twice daily as needed taking care to avoid axillae and groin area. 04/10/16   Bobbitt, Sedalia Muta, MD  ?Coenzyme Q-10 200 MG CAPS Take 1 capsule by mouth daily. 11/04/14   [provider]  ?Continuous Blood Gluc Receiver (DEXCOM G6 RECEIVER) DEVI 1 Device by Does not apply route as directed. 08/06/20   Sherrlyn Hock, MD  ?Continuous Blood Gluc Sensor (DEXCOM G6 SENSOR) MISC Inject 1 applicator into the skin as directed. (change sensor every 10 days) 08/06/20   Sherrlyn Hock, MD  ?Continuous Blood Gluc Sensor (FREESTYLE LIBRE 2 SENSOR) MISC Inject 1 Device into the skin every 14 (fourteen) days. ?Patient not taking: Reported on 01/31/2021 08/31/20   Sherrlyn Hock, MD  ?Continuous Blood Gluc Transmit (DEXCOM G6 TRANSMITTER) MISC Inject 1 Device into the skin as directed. (re-use up to 8x with each new  sensor) 08/06/20   Sherrlyn Hock, MD  ?EPINEPHrine 0.3 mg/0.3 mL IJ SOAJ injection Inject 0.3 mg into the muscle once. As needed ?Patient not taking: Reported on 12/27/2020    [provider]  ?Glucagon (BAQSIMI TWO PACK) 3 MG/DOSE POWD Place 1 spray into the nose as directed. ?Patient not taking: Reported on 12/27/2020 08/27/20   Sherrlyn Hock, MD  ?HAILEY FE 1/20 1-20 MG-MCG tablet Take 1 tablet by mouth daily. 12/27/20   [provider]  ?leucovorin (WELLCOVORIN) 25 MG tablet TAKE 1 TABLET BY MOUTH 2 TIMES A DAY 04/09/21   Rocky Link, MD  ?levOCARNitine (CARNITOR) 1 GM/10ML solution TAKE 2 TEASPOONFUL (10ML) BY MOUTH TWICEDAILY ?Patient taking differently: Place 1,000 mg into feeding tube daily. 07/29/17   Rocky Link, MD  ?lidocaine (XYLOCAINE) 5 % ointment Apply 1 application topically as needed. 10/12/17   Rocky Link, MD  ?metaxalone (SKELAXIN) 800 MG tablet TAKE 1/2 TABLET (400MG  TOTAL) BY MOUTH DAILY AS NEEDED (MUSCLE PAIN) 12/13/20   Rocky Link, MD  ?norethindrone-ethinyl estradiol (LOESTRIN) 1-20 MG-MCG tablet Take by  mouth. ?Patient not taking: Reported on 01/31/2021 12/27/20   [provider]  ?Nutritional Supplements (NUTRITIONAL SUPPLEMENT PLUS) LIQD 3 cartons (711 mL) of Ensure Clear given via gtube daily. 01/16/21

## 2021-05-21 NOTE — ED Triage Notes (Signed)
Pt states cough and cp seen here last week for same. Advised by complex care to return for chest xray. ?

## 2021-05-22 ENCOUNTER — Ambulatory Visit (INDEPENDENT_AMBULATORY_CARE_PROVIDER_SITE_OTHER): Payer: PRIVATE HEALTH INSURANCE | Admitting: "Endocrinology

## 2021-05-23 ENCOUNTER — Ambulatory Visit (INDEPENDENT_AMBULATORY_CARE_PROVIDER_SITE_OTHER): Payer: PRIVATE HEALTH INSURANCE | Admitting: "Endocrinology

## 2021-06-04 LAB — CBC WITH DIFFERENTIAL/PLATELET
Absolute Monocytes: 566 cells/uL (ref 200–950)
Basophils Absolute: 57 cells/uL (ref 0–200)
Basophils Relative: 0.7 %
Eosinophils Absolute: 262 cells/uL (ref 15–500)
Eosinophils Relative: 3.2 %
HCT: 41.4 % (ref 35.0–45.0)
Hemoglobin: 14 g/dL (ref 11.7–15.5)
Lymphs Abs: 2591 cells/uL (ref 850–3900)
MCH: 29.2 pg (ref 27.0–33.0)
MCHC: 33.8 g/dL (ref 32.0–36.0)
MCV: 86.3 fL (ref 80.0–100.0)
MPV: 9.8 fL (ref 7.5–12.5)
Monocytes Relative: 6.9 %
Neutro Abs: 4723 cells/uL (ref 1500–7800)
Neutrophils Relative %: 57.6 %
Platelets: 399 10*3/uL (ref 140–400)
RBC: 4.8 10*6/uL (ref 3.80–5.10)
RDW: 12.1 % (ref 11.0–15.0)
Total Lymphocyte: 31.6 %
WBC: 8.2 10*3/uL (ref 3.8–10.8)

## 2021-06-04 LAB — T4, FREE: Free T4: 1.3 ng/dL (ref 0.8–1.8)

## 2021-06-04 LAB — IRON: Iron: 192 ug/dL — ABNORMAL HIGH (ref 40–190)

## 2021-06-04 LAB — T3, FREE: T3, Free: 3.1 pg/mL (ref 2.3–4.2)

## 2021-06-04 LAB — TSH: TSH: 0.6 mIU/L

## 2021-06-11 ENCOUNTER — Telehealth (INDEPENDENT_AMBULATORY_CARE_PROVIDER_SITE_OTHER): Payer: Self-pay

## 2021-06-11 DIAGNOSIS — E039 Hypothyroidism, unspecified: Secondary | ICD-10-CM

## 2021-06-11 DIAGNOSIS — G901 Familial dysautonomia [Riley-Day]: Secondary | ICD-10-CM

## 2021-06-11 DIAGNOSIS — E221 Hyperprolactinemia: Secondary | ICD-10-CM

## 2021-06-11 DIAGNOSIS — D509 Iron deficiency anemia, unspecified: Secondary | ICD-10-CM

## 2021-06-11 NOTE — Telephone Encounter (Signed)
-----   Message from Fransisco Hertz, CMA sent at 06/11/2021 11:47 AM EDT -----  ----- Message ----- From: David Stall, MD Sent: 06/11/2021  11:15 AM EDT To: [facility] Clinical Pool  Thyroid tests were normal.  CBC was normal.  Iron was too high. Please stop any iron-containing medications, supplements, and vitamins.   Clinical staff: Please order a  serum iron in 3 months. Thanks. Dr. Fransico Michael

## 2021-06-11 NOTE — Telephone Encounter (Signed)
My chart message sent to pt, and orders placed for a serum iron  lab draw In 3 months.

## 2021-06-12 NOTE — Progress Notes (Unsigned)
Subjective:  Subjective  Patient Name: Kelaia Inks Date of Birth: 2000/10/12  MRN: [id number]  Lameeka Dungee  presents for her clinic visit today for follow up evaluation and management of her acquired hypothyroidism due to Hashimoto's thyroiditis, goiter, chronic fatigue, dyspepsia, GERD, and many other health problems.   HISTORY OF PRESENT ILLNESS:   Mirisa is a 21 y.o. Caucasian young woman.   Sokhna was accompanied by her mother.  1. Jayli's initial pediatric endocrine clinic visit here at [facility] occurred on 01/29/18. The following information is a composite of the history gained at her initial visit, information gained after 7 hours of reviewing her records in [place], and information gleaned on 02/05/18:   A. Hadden has had a very complicated medical and surgical history. She has been hospitalized many times during her life. Her problem list includes the following:    1). Chronic GI problems, with frequent constipation, frequent diarrhea, BMs occurring immediately after eating, nausea, abdominal pains, and feeding difficulties requiring the use of both oral and nocturnal G-tube feedings: Previous EGD and colonoscopy have been unremarkable. TTG IgA was negative on 12/29/17.    2). Chronic allergies, including seasonal allergies, allergies to other environmental agents, latex, Propofol, fish and shellfish according to rashes but not to testing, and Alpha-Gal allergy with a positive result, but not specifically beef, pork, or lamb;   3). Chronic fatigue   4). Iron deficiency anemia   5). Hypothyroidism with elevated TPO and thyroglobulin antibodies, c/w Hashimoto's thyroiditis;       6). Muscle weakness and myalgias   7). 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 Family was offered treatment with Florinef, but declined because their pharmacist advised against starting Florinef due to possible adverse effects.     8).  Intermittent dehydration requiring administration of iv fluids via her port-a-cath every 6 weeks, but more recently every 2 weeks   9). Chronic headaches, to include migraines             10). 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 Chronic swelling of her hands and feet   13). Chronic pain of her back, joints, legs, and feet   14). Skin rashes, hives, eczema, and flushing   15). Irregular menses and menorrhagia   16). Nocturnal hypoglycemia if she does not have nocturnal pump feedings.    17). Chronic illness with low grade fevers, sometimes up to 101, but mostly 99-100 degrees   18). 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 However, her testing did not support that hypothesis. Mom has the same genetic variant, but not all of the same problems.   B. Perinatal history: Gestational Age: [redacted]w[redacted]d; 5 lb 7 oz (2.466 kg); Healthy newborn, except noted to have a right club foot deformity.    C. Infancy: She had to be hospitalized several times for unexplained dehydration. She had club foot surgery at about 33 weeks of age.   D. Childhood:   1). She was sick a lot.    2). She had PT for her club foot.    3). A Chiari malformation was discovered at age 75. She had decompression surgery  in 2007.    4). In 2008 she was discovered at [facility] to have a tethered cord. She had a release procedure in 2008.   5). In 2010 she had a muscle BX that showed mild variation in fiber size, more notable in Type I fibers with rare atrophic Type I fibers. EM showed that the normal sarcomeric architecture was preserved. Mitochondria were morphologically unremarkable. Dr. 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    6). In 2010 she continued to have severe problems with lack of appetite, nausea, and feeding difficulties. In late 2010 she had a G-tube placed.    7). Genetic testing was performed at [facility] on 09/02/08. Karyotype and chromosomal array were normal. 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 Mitochondrial DNA copy number in muscle tissue was normal, 123% of the mean value of the age and tissue tested controls.   8). On 10/18/12 Jamilia's TFTs were c/w primary hypothyroidism. Levothyroxine treatment was initiated.    9). She had a cholecystectomy in June 2015 for biliary hypokinesia.    10). A port-a-cath was placed in August 2018.    11). In December 2018 she saw a geneticist, Dr. Penni Bombard, in [place] for evaluation of a mitochondrial dysfunction. 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 Targeted GeneDX testing was performed in June-July 2019 and reportedly showed allergies in both [place] and her brother. Testing in Lynzi's brother also showed genetic tendencies for seizures and developmental delays.     E. Chief complaint:   1). Hypothyroidism was diagnosed on 18 October 2012 after several years of variable thyroid hormone test results. Her TSH was high 10.35 (ref 0.5-4.5) and free T4 was low at 0.67 (ref 0.80-2.0). TPO antibody and thyroglobulin antibody were both reportedly positive. She started thyroid hormone at about that time. She takes generic levothyroxine, 50 mcg/day. That dose has not been changed since it was started. She does not think that she has ever had thyroid swelling or pain. Mom said that Shamarra's TSH and free T4 values have been normal. However, her TSH was elevated again in February 2019.   2). Hypoglycemia: At about age 25 she had low BGs almost all the time. If she did not get G-tube feedings at  night, the BGs in the mornings could be in the 40s-50s. She had never been evaluated for adrenal insufficiency. If her BGs are low during the day she can get dizzy.   3). Dizziness: She has been dizzy for many years. The dizziness was originally ascribed to her Arnold-Chiari malformation, but did not improve after surgery. About 30% of her dizziness was associated with low BGs. Some of her dizziness was a spinning dizziness.  Some of her dizziness appears to be orthostatic dizziness. This latter dizziness occurs almost every morning when she sits up from a supine or prone position. The dizziness can also occur if she stands up too fast from a seated position. Whenever she gets dizzy for any reason, she often gets black spots in front of her eyes and feels like she will pass out.   4). Abnormal periods and excessive menstrual and intermenstrual bleeding: Menarche occurred at age 67. Periods have never been regular. When she took most OCPs she had persistent vaginal bleeding. Seasonale caused her to have menses every 3 months for about one year, then it became less effective. She continued to have heavy spotting. This problem has  been sometimes attributed to a connective tissue disorder.    5). 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 Her initial [facility] consultation occurred on 03/15/12.      1). The report of her 2009 muscle BX was reviewed. The genetics reports from [facility] done in July 2010 and from [place] in 2010 were reviewed. Also reviewed was a report from Dr. Salomon Fick of the [facility] who did not endorse support for a diagnosis of mitochondrial disease.      2). The [facility] geneticist noted that none of Grisela's previous testing supported a diagnosis of a mitochondrial disorder. The geneticist also stated that no specific genetic disorders are known to cause her constellation of symptoms shared by her and her younger brother, including Chiari I  malformation. Genome sequencing testing, to determine if Stevee and her brother had a unique gene mutation underlying their presentations, was offered to the family. Unfortunately the costs of that testing were too high for the family to agree to the testing     B). Her [facility] record note from 10/15/15 shows that Kinleigh's previous tests for myopathy and mitochondrial function were all negative. Genetic whole exome testing was negative for a 21 y.o. female with Chiari I malformation, fatigue, muscle weakness, feeding difficulties, tethered cord, and clinical diagnosis of Ehlers-Danlos syndrome. GeneDX testing showed a variant in the mitochondrial genome of uncertain significance that was found in 200% of Elleigh's mitochondria and 100% of her mother's mitochondria. Brother presumably had the same variant. It was noted that since [place] and Braxton inherited the same change as the mother and at the same levels, it was unlikely that this variant was causing Chloeanne's and Braxton's symptoms, because the mother did not exhibit similar symptoms. [Addendum 02/05/18: Mom says that she did have some of the same symptoms that her children had, but the geneticist might not have realized that mom did have some of the same symptoms. Mom did not have as many symptoms as Sahra and Braxton had.] Multiple rounds of genetic testing including WES and mitochondrial genome sequencing, were not suggestive of mitochondrial disorder or any other genetic diseases. The family was offered the option of applying for further testing in the [facility]. The family applied for that program, but were not accepted due to the fact that the program wanted patients with different issues.     C). When Kathlene saw Dr Penni Bombard, a geneticist in [place], he confirmed that she does have a mitochondrial disorder and also has Ehlers-Danlos syndrome.     6). Feeling unwell, fatigue, fevers, headaches, nasal and head congestion, lack of  appetite: These problems vary from one part of the day to another. She has at least one of them every day.     A). Fatigue: She does not feel tired when she awakens, but does begin to feel tired about 2 PM. If she naps for an hour or two, the fatigue improves for several hours, but then recurs around 6 PM. If she does not nap, she remains tired and develops a bitemporal headache about 4-5 PM. She occasionally has a problem with insomnia, but not with early awakening. Sleeping in on weekends does not improve her fatigue.      B). Fevers: When she feels bad she usually has fevers. Most of her fevers are in the 98.8-99.2 range. These "fevers" occur about 5-7 PM.     C). Nasal and head congestion: She has had a stuffed  up nose for 4-5 months. When she has nasal congestion she also sometimes has head congestion. However, she does not have head congestion without having nasal congestion.      D). Headaches: She often has nuchal headaches when her trapezius muscles are sore/stiff. Sometimes these nuchal headache extend to the vertex. She also has bitemporal headaches when she gets tired.   F. Pertinent family history:   1). Stature and puberty: Mom is 5-1. Dad is 5-5. Mom had menarche at age 59. Mom has had irregular menses prior to her first pregnancy. She was diagnosed with endometriosis after her first pregnancy. After treatment for endometriosis, presumably GnRH analogues for 6 months, her menses were normal.     2). Obesity: Maternal grandmother   3). DM: Maternal grandmother has T2DM. A maternal grand uncle had DM, possibly type 1.    4). Thyroid: 71 y.o. sister has had a few abnormal thyroid hormone levels. Paternal grandmother is hypothyroid, without having had thyroid surgery, irradiation. or been on a prolonged low iodine diet.    5). ASCVD: Mother has a heart murmur.    6). Cancers: paternal grandfather dies of lung CA. There was also leukemia in a distant paternal relative.    7). Others: Older  sister has had oligomenorrhea frequently. Mother has migraines. Younger brother has worse mitochondrial dysfunction and seizures. Maternal grandmother has osteoarthritis and fibromyalgia.  Maternal grandmother takes B12 shots for anemia. Maternal grandmother also has hypertension.  One maternal uncle had cardiomyopathy. Maternal grand uncle had his first CVA at age 14. A distant paternal second cousin and a paternal first cousin had lupus.]  G. Lifestyle:   1). Bryson's diet: She can eat food normally and typically eats 3 small meals per day. She estimates that about 40% of her calories comes from oral intake. She does not eat much at any one time. She is not a "foodie" by nature and is a very picky eater.  She has G-tube feedings every night: 2 cans of Vital Peptide, 1.5 calories per mL, continuous feeding, from 9:30 PM to 7:30 AM via a pump. She does not usually take fluids by the G-tube during the day, but may do so during the Summer months to prevent dehydration.    2). She receives iv fluid infusions by her port-a-cath every other week: D5 1/2 NS, three 1000 ml bags over 12 hours. If she gets sick or gets dehydrated, she gets 3000 ml of D10NS iv over 12 hour. She receives substantial home health nursing support from [facility].     3). Physical activities: She does not walk much for exercise because walking causes her feet to swell. She does not go outside much during the Winter months. She was told by one doctor that she had angioedema.    4). Medications:    A). Morning medications: CoQ10, vitamin E, vitamin C. Prevacid, and allergy medications. Breakfast is usually a fruit smoothy.     B). Bedtime: LT4, Topamax, Lyrica, Seasonale,  2. Clinical course:  A. In May 2020 she had a central venous catheter access device inserted. The catheter is working well.  Crissie Figures saw Dr. Damita Lack on 11/19/18. Her PFTs were essentially normal. The MIP/MEPs were better, but still low. Her sleep study was  normal. He put her on Mestinon three times daily. The Mestinon helped for several hours.   C. She also saw Dr. Artis Flock on 11/19/18. Dr. Artis Flock ordered an EMG to be done at [facility].   DGerarda Gunther  has a long history of developing physical symptoms that she attributes to adverse reactions to medications and then stopping those medications without discussing the issues with the physicians that prescribed the medications. I wonder if some of those reactions were really due to the medications involved. She also has a history of not always following up with specialists that she has seen if what they prescribed she felt was ineffective or might have caused adverse reactions .   E. I tried to start her on fludrocortisone in January 2021, but she stopped it after about 3 weeks because it made her feel bad.   F. On 03/04/19 Dr. Artis Flock had a video visit with Alisia and increased her mestinon to 120 mg, three times daily. Akane feels that the mestinon is helping "a little bit".   G. On 03/06/19 she saw a GI specialist at [facility]. She was treated with IBGard as needed. She did not feel that the IBGard helped very much and did not contact the specialist for follow up.   H. On 03/24/19 Zakyah's G-tube and port-a-cath were replaced.    I. On 05/11/19 she was seen in ortho. A diagnosis of bilateral shoulder subluxations was made, presumably due to Ehlers-Danlos syndrome. She was in PT.   J. On 06/23/19 she had a NCV/EMG study at [facility]. Family does not know the results.   K. She saw ENT in October 2021. 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 The ENT suggested that she might have TMJ.  3. Yarisbel's last [facility] visit occurred on 12/27/20. I continued her on Synthroid 88 mcg/day and rabeprazole, 20 mg, twice daily. The rabeprazole his helping. However, after reviewing her lab results from May 2023, I asked that she stop all iron-containing products.   A. In the interim she has been fairly healthy, except  for a some drops in BG.   B. She saw GYN today and was diagnosed with endometriosis.  C. Her low BGs occur 30-120 minutes after eating. She snacks frequently during the day. She usually never eats large meals because her stomach feels too full. She says that she is now taking in more calories via her G-tube than orally. She now increases her G-tube feedings at night to stabilize her BGs during the night. She no longer has nausea in the middle of the night and not often during the day. In the past the nausea often took away her appetite. Sometimes when she ate the nausea became worse. Overall the amount of nausea has decreased.   C. She still has some back pain, part of which may be due to endometriosis.   D. She has had more excess vaginal bleeding. Reilyn will probably soon be converted to another OCP.   E. She feels hot at night. She still tends to be cold during the day. F. She is still often tired, "the same".   G. She thinks her headaches are better, in that they are less frequent, but still severe. She now has headaches about every 2 weeks.   H. She still has orthostatic dizziness, but not as often.    I. Her breathing difficulties have remained about the same. Dr. Damita Lack feels that she may have muscle weakness. He has referred her back to Dr. Artis Flock. Dr Artis Flock thinks Noraa may have myasthenia.   J. She still takes 88 mcg/day of Synthroid. She still has weekly 12-hour iv infusions of D5. She feels better for about a week after the infusions. She has been taking  rabeprazole twice a day because she is "terrible about taking night-time medicines". She is also taking more Skelaxin.   K.  She continues to have episodic pains in the TMJ area.   L. The eczema around her eyes and forehead is better. She still applies a clobetasol cream given her by dermatology in [place].   4. Pertinent Review of Systems:  Constitutional: Cleotilde says she feels "good". She is "less tired". She is colder than others all  of the time. When she was out in the heat during the Summer, she sometimes sweated. She is also now sweating in her hands, axillae, and groin at other times.  Eyes: Vision is "not great". She saw a neuroophthalmologist on 06/16/19. The possibility of optic neuropathy, similar to her brother, was raised. Electra will be followed every two years.  There are no other significant eye complaints. Neck: The patient has had more complaints of anterior neck soreness and difficulty swallowing recently.  Heart: Her heart rate has not felt subjectively to be unusually fast or slow. The patient has few complaints of palpitations, irregular heat beats, chest pain, or chest pressure. Gastrointestinal: As above. She has much less nausea in the middle of the night. She no longer has to take Zofran very often. She no longer has nausea during the day or during the night. She has not been able to identify a particular trigger. She also occasionally has some mild reflux. She no longer has epigastric pains. Bowel movents have been more normal. She is still having alternating diarrhea and constipation. The patient has no complaints of excessive hunger. Hands: Hands are dry now, but will be more moist later in the day. Strength is "fine".   Legs: Muscle mass and strength "are the same". She occasionally has pains of her anterior thighs and anterior shins. There are no other complaints of numbness, tingling, burning, or pain. She notes edema about every day. Edema is worse in the evenings.   Feet: Feet are always wet and cold. There are no obvious foot problems. She occasionally has numbing of the medial 3-4 toes. There are no other complaints of numbness, tingling, burning, or pain. She often has dorsal edema.  GYN: As above.   PAST MEDICAL, FAMILY, AND SOCIAL HISTORY  Past Medical History:  Diagnosis Date   Allergy to alpha-gal    elevated IgE 04/10/16   Autonomic dysfunction    Chiari I malformation (HCC)    Complication  of anesthesia    slow to wake up   Dyspnea    Eczema    GERD (gastroesophageal reflux disease)    Headache    Heart murmur    Hypoglycemia    Hypothyroid    IBS (irritable bowel syndrome)    Joint pain    Mitochondrial disease (HCC)    Muscle pain    Pneumonia    several times   PONV (postoperative nausea and vomiting)     Family History  Problem Relation Age of Onset   Asthma Mother    Allergic rhinitis Mother    Heart murmur Mother    Allergic rhinitis Sister    Thyroid disease Sister    Allergic rhinitis Brother    Asthma Brother    Seizures Brother    Mitochondrial disorder Brother    Allergic rhinitis Maternal Grandmother    Asthma Maternal Grandmother    Diabetes type II Maternal Grandmother    Lung cancer Maternal Grandfather    Angioedema Neg Hx    Eczema  Neg Hx    Immunodeficiency Neg Hx    Urticaria Neg Hx      Current Outpatient Medications:    albuterol (ACCUNEB) 0.63 MG/3ML nebulizer solution, Take 3ml by nebulizer 3 times per day as needed for shortness of breath, Disp: 270 mL, Rfl: 5   albuterol (PROVENTIL HFA) 108 (90 Base) MCG/ACT inhaler, Inhale 1-2 puffs into the lungs every 4 (four) hours as needed for wheezing or shortness of breath., Disp: 18 g, Rfl: 0   ascorbic acid (VITAMIN C) 500 MG tablet, Take 500 mg by mouth daily. , Disp: , Rfl:    azelastine (ASTELIN) 0.1 % nasal spray, Place 2 sprays into both nostrils 2 (two) times daily. Use in each nostril as directed, Disp: 30 mL, Rfl: 5   azithromycin (ZITHROMAX Z-PAK) 250 MG tablet, Take 2 pills PO in a single dose today, followed by one pill PO days 2-5, Disp: 6 tablet, Rfl: 0   clobetasol (OLUX) 0.05 % topical foam, Apply topically 2 (two) times daily. Apply to affected areas twice daily as needed taking care to avoid axillae and groin area., Disp: 45 g, Rfl: 3   Coenzyme Q-10 200 MG CAPS, Take 1 capsule by mouth daily., Disp: , Rfl:    Continuous Blood Gluc Receiver (DEXCOM G6 RECEIVER) DEVI, 1  Device by Does not apply route as directed., Disp: 1 each, Rfl: 2   Continuous Blood Gluc Sensor (DEXCOM G6 SENSOR) MISC, Inject 1 applicator into the skin as directed. (change sensor every 10 days), Disp: 3 each, Rfl: 11   Continuous Blood Gluc Sensor (FREESTYLE LIBRE 2 SENSOR) MISC, Inject 1 Device into the skin every 14 (fourteen) days. (Patient not taking: Reported on 01/31/2021), Disp: 2 each, Rfl: 11   Continuous Blood Gluc Transmit (DEXCOM G6 TRANSMITTER) MISC, Inject 1 Device into the skin as directed. (re-use up to 8x with each new sensor), Disp: 1 each, Rfl: 3   EPINEPHrine 0.3 mg/0.3 mL IJ SOAJ injection, Inject 0.3 mg into the muscle once. As needed (Patient not taking: Reported on 12/27/2020), Disp: , Rfl:    Glucagon (BAQSIMI TWO PACK) 3 MG/DOSE POWD, Place 1 spray into the nose as directed. (Patient not taking: Reported on 12/27/2020), Disp: 2 each, Rfl: 3   HAILEY FE 1/20 1-20 MG-MCG tablet, Take 1 tablet by mouth daily., Disp: , Rfl:    leucovorin (WELLCOVORIN) 25 MG tablet, TAKE 1 TABLET BY MOUTH 2 TIMES A DAY, Disp: 180 tablet, Rfl: 1   levOCARNitine (CARNITOR) 1 GM/10ML solution, TAKE 2 TEASPOONFUL ( ) BY MOUTH TWICEDAILY (Patient taking differently: Place 1,000 mg into feeding tube daily.), Disp: 118 mL, Rfl: 12   lidocaine (XYLOCAINE) 5 % ointment, Apply 1 application topically as needed., Disp: 35.44 g, Rfl: 3   metaxalone (SKELAXIN) 800 MG tablet, TAKE 1/2 TABLET (400MG  TOTAL) BY MOUTH DAILY AS NEEDED (MUSCLE PAIN), Disp: 30 tablet, Rfl: 3   montelukast (SINGULAIR) 10 MG tablet, Take 1 tablet (10 mg total) by mouth at bedtime., Disp: 30 tablet, Rfl: 0   norethindrone-ethinyl estradiol (LOESTRIN) 1-20 MG-MCG tablet, Take by mouth. (Patient not taking: Reported on 01/31/2021), Disp: , Rfl:    Nutritional Supplements (NUTRITIONAL SUPPLEMENT PLUS) LIQD, 3 cartons (711 mL) of Ensure Clear given via gtube daily., Disp: 22041 mL, Rfl: 12   Nutritional Supplements (NUTRITIONAL  SUPPLEMENT PLUS) LIQD, 237-474 mL (1-2 cartons) Boost Breeze (peach flavored only) given po daily., Disp: 14694 mL, Rfl: 12   ondansetron (ZOFRAN-ODT) 4 MG disintegrating tablet, Take 1 tablet under  the tongue every 6-8 hours as needed for nausea., Disp: 30 tablet, Rfl: 5   predniSONE (DELTASONE) 20 MG tablet, Take 2 tablets daily with breakfast., Disp: 10 tablet, Rfl: 0   pregabalin (LYRICA) 25 MG capsule, Take 1 capsule (25 mg total) by mouth 2 (two) times daily., Disp: 60 capsule, Rfl: 5   pregabalin (LYRICA) 75 MG capsule, TAKE 2 CAPSULES BY MOUTH AT BEDTIME, Disp: 60 capsule, Rfl: 5   PRESCRIPTION MEDICATION, Apply 1 application topically as needed (port). Elma- cream , Disp: , Rfl:    promethazine (PHENERGAN) 25 MG tablet, Take 1 tablet at onset of migraine. May repeat every 6 hours PRN, Disp: 30 tablet, Rfl: 0   promethazine-dextromethorphan (PROMETHAZINE-DM) 6.25-15 MG/5ML syrup, Take 2.5 mLs by mouth 4 (four) times daily as needed for cough., Disp: 100 mL, Rfl: 0   pyridostigmine (MESTINON) 60 MG tablet, Take 1 tablet (60 mg total) by mouth in the morning, at noon, in the evening, and at bedtime., Disp: 120 tablet, Rfl: 5   RABEprazole (ACIPHEX) 20 MG tablet, TAKE ONE TABLET TWICE DAILY., Disp: 60 tablet, Rfl: 6   rizatriptan (MAXALT) 10 MG tablet, TAKE 1 TABLET BY MOUTH AS NEEDED FOR MIGRAINE. MAY REPEAT IN 2 HOURS IF NEEDED., Disp: 12 tablet, Rfl: 3   SYNTHROID 88 MCG tablet, TAKE 1 TABLET BY MOUTH EVERY MORNING ON AN EMPTY STOMACH, Disp: 90 tablet, Rfl: 1   topiramate (TOPAMAX) 25 MG tablet, TAKE 1 TABLET BY MOUTH EVERY NIGHT AT BEDTIME, Disp: 30 tablet, Rfl: 5   Topiramate ER (TROKENDI XR) 50 MG CP24, TAKE 1 CAPSULE BY MOUTH DAILY, Disp: 30 capsule, Rfl: 5   traZODone (DESYREL) 50 MG tablet, TAKE 1/2 TABLET BY MOUTH 30 MINUTES PRIOR TO BEDTIME. IF NEEDED, MAY INCREASE TO 1 TABLET BEFORE BEDTIME, Disp: 30 tablet, Rfl: 1   vitamin E 200 UNIT capsule, Take 200 Units by mouth daily., Disp: ,  Rfl:   Allergies as of 06/13/2021 - Review Complete 05/21/2021  Allergen Reaction Noted   Other Anaphylaxis 03/29/2013   Latex Rash 04/08/2012   Lactated ringers  05/28/2018   Propofol Other (See Comments) 05/17/2012   Tape Itching and Other (See Comments) 05/17/2012     reports that she has never smoked. She has never used smokeless tobacco. She reports that she does not drink alcohol and does not use drugs. Pediatric History  Patient Parents   Sarate,Leesa (Mother)   Hilbert Corrigan (Father)   Other Topics Concern   Not on file  Social History Narrative   Luana graduated from [employer]. Attends [employer]. She is majoring in History This is her senior year!    She enjoys reading, playing with her cat and her dog, and hanging out with her sister.     1. School and Family: She attends [employer] with mostly on-campus classes. She lives at home.  2. Activities: Sedentary 3. Primary Care Provider: Verdene Lennert, MD  4. Peds Neurology: Dr. [name], MD 5. Allergy: Dr. Malachi Bonds, [facility] [place] 6. GI: No one at present 7. Peds Endocrinology: Dr. Fransico Tywan Siever 8. Peds Pulmonary: Dr. Damita Lack 9. Genetics: [facility] in the past 10. GYN:   REVIEW OF SYSTEMS: There are no other significant problems involving [place]'s other body systems.    Objective:  Objective  Vital Signs:  There were no vitals taken for this visit.   Ht Readings from Last 3 Encounters:  01/31/21 5' 3.78" (1.62 m)  01/31/21 5' 3.78" (1.62  m)  10/30/20 5' 4.5" (1.638 m)   Wt Readings from Last 3 Encounters:  01/31/21 125 lb 3.2 oz (56.8 kg)  01/31/21 125 lb 3.2 oz (56.8 kg)  12/27/20 124 lb 2 oz (56.3 kg)   HC Readings from Last 3 Encounters:  06/22/20 20.87" (53 cm)   There is no height or weight on file to calculate BSA. Facility age limit for growth percentiles is 20 years. Facility age limit for growth percentiles is 20 years.   PHYSICAL  EXAM:  Constitutional: Jenille looks somewhat tired, but better. She is awake and alert, but fairly passive. She answers questions with 1-3 word answers, but does not volunteer any information. Her affect is rather flat. Her insight is good. She has gained 6 pounds in 4 months.   Eyes: There is no arcus or proptosis.  Mouth: The oropharynx appears normal. The tongue appears normal. There is normal oral moisture. There is no obvious gingivitis. Thre is no oral hyperpigmentation.  Neck: There are no bruits present. The thyroid gland appears mildly enlarged.  The thyroid gland is slightly smaller at about 21+ grams in size. Today the right lobe has shrunk back to top-normal size and her left lobe is larger. The consistency of the left lobe of the gland is full, but the right lobe is normal.  She has no thyroid tenderness today.  Lungs: The lungs are clear. Air movement is good. Heart: The heart rhythm and rate appear normal. Heart sounds S1 and S2 are normal. I do not appreciate any pathologic heart murmurs. Abdomen: The abdomen is normal in size. Bowel sounds are normal. The G-tube site has some serosanguinous liquid and crusting around the site. I asked her to clean the area with alcohol. There is no obviously palpable hepatomegaly, splenomegaly, or other masses. There was no tenderness to palpation.  Arms: Muscle mass appears appropriate for age.  Hands: There is no obvious tremor. Phalangeal and metacarpophalangeal joints appear normal. Palms are normal. Nails are not pallid.  Legs: Muscle mass appears appropriate for age. There is no edema.  Neurologic: Muscle strength is 4-5/5 in the UEs and 4-5/5 in th LEs. Muscle tone appears normal. Sensation to touch is normal in the legs.   LAB DATA:   Labs 06/03/21: TSH 0.60, free T4 1.3, free T3 3.1; CBC normal; iron 192 (ref 40-190)  Labs 12/18/20: TSH 0.60, free T4 1.5, free T3 3.3; CMP normal, except chloride 117 (ref 98-110) and CO2 12 (ref 29-32); CBC  normal, except RBC 5.18 (ref 3.80-5.10), HCT 46.7 (ref 35-45), eosinophils 611 (ref 15-500); C-peptide 1.98 (ref 0.80-3.85); iron 146 (ref 40-190)  Labs 08/27/20: CBG 105  Labs 08/15/20: TSH 1.14, free T4 1.3, free T3 2.8; CMP normal, except CO2 19 (ref 20-32); CBC normal, except RC 5.12 (ref 3.80-5.10); iron 102 (ref 40-190); prolactin 7.1 (ref 3-30)  Labs 04/20/20: TSH 1.14, free T4 1.4, free T3 2.9; CBC normal, except platelets 428 (ref 140-400);   Labs 11/17/19: TSH 1.21, free T4 1.1, free T3 2.9 (ref 3.0-4.7); CBC normal; iron 69; LH <0.2, FSH 0.7,   Labs 07/25/19: HbA1c 4.8%,  TSH 1.67, free T4 1.3, free T3 3.0; CBC normal, except RBC 5.22 (ref 3.8-5.1) and Hct 45.5% (ref 35-45%); iron 79 (ref 40-190)  Labs 03/24/19: Serum sodium 141, potassium 5.2, chloride 19, CO2 18, glucose 81  Labs 02/10/19: HbA1c 5.0%; TSH 0.73, free T4 1.3, free T3 3.0; CMP normal  Labs 11/12/18: TSH 1.12, free T4 1.38, T3 156 (ref 71-180);  prolactin 11.3 (ref 4.8-23.3)  Labs 09/16/18: Androstenedione 55 (ref 51-230), DHEAS 67 (ref 51-321)  Labs 08/30/18 at 8:43 AM: ACTH 13 (ref 6-50), cortisol 26.2 (ref 4-22), aldosterone 4 (ref < 28), plasma renin activity 2.4 (ref 0.25-5.82),   Labs 08/02/18: TSH 2.12, free T4 1.3, free T3 3.0; CMP normal; aldosterone <1 (reg 3-28), plasma renin activity (PRA) 0.92 (ref 0.25-5.82); prolactin 9.9 (ref 3-30)  Labs 02/01/18 at 8 AM: TSH 4.16 (ref 0.50-4.30, but many endocrinologists consider 3.4 to be the true physiologic upper limit of normal), free T4 1.4 (ref 0.8-1.4), free T3 2.7 (ref 3.0-4.7), TPO antibody 241 (ref <9), thyroglobulin antibody 1 (ref < or = 1); CMP normal except CO2 19; cholesterol 123, triglycerides 114, HDL 37, LDL 66; Prolactin 13.0 (ref 3.3-20); IGFBP-3 7.8 (ref 3.2-7.9); IGF-1 249 (ref 108-548, but very appropriate for a young woman whose height has plateaued); iron 177 (ref 27-164); CBC normal; androstenedione 46 (ref 51-230), DHEAS 74 (ref 51-321); ACTH 14,  cortisol 28.9  Labs 11/25/17: TSH 1.58; CBC normal except 736 eosinophils (ref 15-500)  Labs 06/24/17: TSH 2.418, free T4 0.98  Labs 04/21/17: TSH 1.30  Labs 03/10/17: TSH 7.87, free T4 1.2; CBC normal; prolactin 7.1  Labs 06/24/16: TSH 1.960, free T4 1.12  Labs 04/10/16: TSH 2.28, TPO antibody 165 (ref <9), thyroglobulin antibody 2 (ref <2)  Labs 05/29/15: TSH 1.640, free T4 1.09  Labs 11/28/14: TSH 1.87, free T4 0.80  Labs 05/31/2014: TSH 1.680, free T4 1.23  Labs 04/19/14: TSH 1.19  Labs 09/30/13: TSH 2.53, free T4 1.14  Labs 02/04/13: TSH 0.88, T4 11.3  Labs 10/18/12: TSH 10.35 (ref 0.5-4.5), free T4 0.67 (ref 0.8-2.0)  IMAGING  MRI C-spine 12/18/20: Small syrinx in the lower cervical cord. 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 Normal thoraco-lumber spine.   Thyroid ultrasound 12/01/19: Small, heterogenous thyroid. 3 mm hypoechoic nodule left inferior lobe. No indication for biopsy or dedicated imaging follow up.     Assessment and Plan:  Assessment  ASSESSMENT:  Claribel is a 21 y.o. Caucasian young woman with multiple medical problems as noted below. At this visit, she is having active Hashimoto's thyroiditis, but is clinically and chemically euthyroid. She is having less fatigue. Her extreme nausea and stomach discomfort have improved while taking rabeprazole once daily. Her unintentional weight loss has resolved and she is gaining tissue weight. Although her BP is still relatively low, she is having less dizziness and more sweating, possibly associated with improvement in her dysautonomia. Her hypoglycemic symptoms persist. Overall she appears to be doing better.   1-3. Hypothyroid, acquired/goiter/thyroiditis:   A. The occurrence of autoimmune thyroid disease with positive anti-thyroid antibodies is very common.   B. At her visit in  January 2020 her TSH was >4.0 and her TPO antibody level was higher. We increased her Synthroid dose to 88 mcg/day.   C. From January to August 2020 her goiter remained the same in overall size, but the lobes had shifted in size. The process of waxing and waning of thyroid gland size was c/w evolving Hashimoto's thyroiditis.   D. At her August visit her TFTs were at about the 30% of the physiologic range. I would usually have  increased the Synthroid dose to 100 mcg/day, but decided to await the results of her Hypothalamic-Pituitary-Adrenal (HPA) axis testing. Fortunately, those results were normal.   E. Since her August 2020 visit  she has had intermittent flare ups of thyroiditis.   E. Her TFTs drawn on 11/12/18 were mid-euthyroid on her current Synthroid dose of 88 mcg/day. Her TFTs on 02/10/19 were at about the 75% of the physiologic range. Her TFTs in July 2021 were mid-euthyroid. Her TFTs in November were mid-euthyroid according to her TSH and free T4, but slightly low according to her free T3. These changes were c/w a flare up of thyroiditis, which she was having. She looked mildly hypothyroid clinically at that time.   F. Her thyroid US in November 2021 was read as normal by the radiologist. By our standards the left and right lobes were mildly enlarged.   G. Her goiter is still asymmetric today, but a bit smaller. The gland is not tender to palpation today. Her TFTs in April 2022 and August 2022 were mid-normal. Her TFTS in December 2022 were at about th 90% of the physiologic range. We will continue the 88 mcg dose for now, but repeat her TFTs prior to her next visit.   H. The episodic tenderness of the gland and the shifting of size in the lobes from visit to visit is c/w evolving Hashimoto's thyroiditis.  4. Dizziness/hypotension:   A. She still occasionally has elements of spinning dizziness c/w vertigo, orthostatic dizziness c/w POTS /dysautonomia and intermittent dehydration. Fortunately, these  symptoms have been less frequent and less severe.   B. Chewing gum and taking meclizine can help the vertigo.  C. Remaining well hydrated can help with the ortho stasis.    D. Since both her aldosterone and renin were "lowish" in October. It was reasonable to conduct a therapeutic trial of fludrocortisone. 0.1 mg/day. Unfortunately, she felt that the medication did not help her, so stopped it.   E. She feels that she has been having less dizziness recently.  5-7. Weakness/fatigue/cold sensation:    A. The cause(s) of these problems is/are unclear. 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   B. Her ACTH and adrenal hormone tests were normal in January 2020 and again in August and September 2020. Her aldosterone was very low in July 2020 but normal in August.   C. As noted above, she was euthyroid in July and November 2021.   D. She could have fibromyalgia, chronic fatigue, or some other "autoimmune" problem. Perhaps she does have mitochondrial dysfunction or myasthenia.   E. Giving the infusions of D5W every week has made Micala feel better.   F. Hot showers cause her to feel weak and dizzy. This problem is c/w orthostatic hypotension and with dysautonomia.  G. Interestingly, her fatigue is better and her evening mildly elevated temperature sensations are bless frequent.   8. Hypoglycemia:   A. The cause(s) of this problem is/are also unclear. Her hypothalamic-pituitary-hepatic axis for [facility] and her hypothalamic-pituitary-adrenal axis seemed to be normal previously. She could have celiac disease, but her tests in December 2019 were negative. She could have a problem with glycogen storage, glycogenolysis, and/or gluconeogenesis. The fact that she often does not eat much, and the fact that she appears to have a very active gastro-colic reflex may interact to impair glucose intake, glucose absorption, and glycogen synthesis.  B. She still had hypoglycemic symptoms if she  does not eat frequently.   Carlynn Spry had a consultation visit with Dr. Ladona Ridgel, our PharmD clinical pharmacist and diabetes educator. Dr. Ladona Ridgel ordered a Dexcom for [place]. Onye says that her Dexcom generally runs about 16 points  higher than her CBGs.   9. Abnormal menses:   A. The cause(s) of this problem is/are also unclear. Mother gives a good history for her own endometriosis that resolved after treatment with GnRH agonists. Alyxia may also have endometriosis.   BGerarda Gunther has recently had more excessive bleeding.   C. Her menses are now c/w her [place] OCPs.  10. Alternating constipation and diarrhea: She has had a very active gastro-colic reflex. Her bowel movements still alternate between diarrhea and constipation, but perhaps less so. She was seen by GI in February 2021, but has not returned to clinic. She is doing fairly well now.  11. Hyperprolactinemia:   A. It is highly likely that her elevated prolactin In January 2020 was due to her hypothyroidism.   B. Her prolactin level in July 2020 was well within normal, paralleling her improvement in thyroid hormone status. Her prolactin level in October 2020 was also quite normal.   Her prolactin level in August 2022 was at about the 25% of the reference range. 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 She may have dysautonomia.  13. Dehydration: She is consciously trying to drink 32 ounces of fluid per day. This problem could be due to an inadequate renin-angiotensin-aldosterone system, or to chronic diarrhea, or to other causes.   14. Bradycardia:   A. She is not on a beta-blocker or combined alpha-beta blocker. The fact that mom also has bradycardia suggests a dysautonomia. Hypothyroidism may also have been a causal factor in the past.  B. Recently, most of her HRs have been in the 60s-70s. HR today was 76. 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 The aldosterone level and  the renin level in August were normal. Her renal function and her adrenal functions were normal in August and September. As noted above, we tried to  conduct a therapeutic trial of fludrocortisone.  16. Breathing difficulty:  Dr. Damita Lack was following this problem, but referred her back to Dr. Artis Flock.   17. Pallid nail beds. Her CBC in July and November 2021 and in April 2022 and August 2022 were essentially normal. Her iron levels in July and November 2021 and in August 2022 were normal. She does not have pallor today. 18. Nausea/heartburn: She is doing better with rabeprazole twice daily.   19. Weight loss, unintentional:  A. Nausea sometimes adversely affects her appetite. If she eats too much, she develops stomach pains.  These symptoms are c/w hyperacidity. Fortunately, her nausea has significantly improved.  B. She has been gaining weight since starting rabeprazole. She gained 6 pounds since her last visit.    PLAN:  1. Diagnostic: I reviewed lab results. I ordered TFTs, CBC, iron to be done 1-2 weeks prior to her next visit.   2. Therapeutic: Continue Synthroid 88 mcg/day for now.  Take rabeprazole, 20 mg, twice daily. 3. Patient education: We discussed all of the above at great length. We again discussed the 10 [place] method to maintain BP when she arises from a supine or sitting position.   4. Follow-up: 4 months.     David Stall, MD, CDE Pediatric and Adult Endocrinology

## 2021-06-13 ENCOUNTER — Encounter (INDEPENDENT_AMBULATORY_CARE_PROVIDER_SITE_OTHER): Payer: Self-pay | Admitting: "Endocrinology

## 2021-06-13 ENCOUNTER — Ambulatory Visit (INDEPENDENT_AMBULATORY_CARE_PROVIDER_SITE_OTHER): Payer: PRIVATE HEALTH INSURANCE | Admitting: "Endocrinology

## 2021-06-13 VITALS — BP 118/66 | HR 80 | Wt 131.0 lb

## 2021-06-13 DIAGNOSIS — R634 Abnormal weight loss: Secondary | ICD-10-CM

## 2021-06-13 DIAGNOSIS — L74512 Primary focal hyperhidrosis, palms: Secondary | ICD-10-CM

## 2021-06-13 DIAGNOSIS — E039 Hypothyroidism, unspecified: Secondary | ICD-10-CM

## 2021-06-13 DIAGNOSIS — L74513 Primary focal hyperhidrosis, soles: Secondary | ICD-10-CM

## 2021-06-13 DIAGNOSIS — E860 Dehydration: Secondary | ICD-10-CM

## 2021-06-13 DIAGNOSIS — R001 Bradycardia, unspecified: Secondary | ICD-10-CM

## 2021-06-13 DIAGNOSIS — E221 Hyperprolactinemia: Secondary | ICD-10-CM

## 2021-06-13 DIAGNOSIS — R12 Heartburn: Secondary | ICD-10-CM

## 2021-06-13 DIAGNOSIS — E063 Autoimmune thyroiditis: Secondary | ICD-10-CM

## 2021-06-13 DIAGNOSIS — E049 Nontoxic goiter, unspecified: Secondary | ICD-10-CM | POA: Diagnosis not present

## 2021-06-13 DIAGNOSIS — R110 Nausea: Secondary | ICD-10-CM

## 2021-06-13 DIAGNOSIS — R531 Weakness: Secondary | ICD-10-CM

## 2021-06-13 DIAGNOSIS — R5382 Chronic fatigue, unspecified: Secondary | ICD-10-CM

## 2021-06-13 DIAGNOSIS — E162 Hypoglycemia, unspecified: Secondary | ICD-10-CM

## 2021-06-13 DIAGNOSIS — R42 Dizziness and giddiness: Secondary | ICD-10-CM

## 2021-06-13 DIAGNOSIS — I951 Orthostatic hypotension: Secondary | ICD-10-CM

## 2021-06-13 DIAGNOSIS — E2740 Unspecified adrenocortical insufficiency: Secondary | ICD-10-CM

## 2021-06-13 DIAGNOSIS — R231 Pallor: Secondary | ICD-10-CM

## 2021-06-13 NOTE — Patient Instructions (Signed)
Follow up visit in 3 months.   At [facility], we are committed to providing exceptional care. You will receive a patient satisfaction survey through text or email regarding your visit today. Your opinion is important to me. Comments are appreciated.   

## 2021-06-18 ENCOUNTER — Other Ambulatory Visit (INDEPENDENT_AMBULATORY_CARE_PROVIDER_SITE_OTHER): Payer: Self-pay | Admitting: Family

## 2021-06-18 MED ORDER — MONTELUKAST SODIUM 10 MG PO TABS
10.0000 mg | ORAL_TABLET | Freq: Every day | ORAL | 0 refills | Status: DC
Start: 2021-06-18 — End: 2021-07-11

## 2021-07-11 ENCOUNTER — Other Ambulatory Visit (INDEPENDENT_AMBULATORY_CARE_PROVIDER_SITE_OTHER): Payer: Self-pay | Admitting: "Endocrinology

## 2021-07-11 ENCOUNTER — Other Ambulatory Visit (INDEPENDENT_AMBULATORY_CARE_PROVIDER_SITE_OTHER): Payer: Self-pay | Admitting: Family

## 2021-07-11 NOTE — Progress Notes (Incomplete)
   Medical Nutrition Therapy - Progress Note Appt start time: *** Appt end time: *** Reason for referral: Gtube dependence Referring provider: Dr. Artis Flock - [facility] Pertinent medical hx: Chiari I malformation, syringomyelia, mitochondrial disease, iron-deficiency anemia, hypoglycemia, goiter, chronic diarrhea, chronic dehydration, alpha-gal hypersensitivity, +G-tube, +port (receives weekly infusions)  Assessment: Food allergies: red meat (can consume poultry), lactose intolerance Pertinent Medications: see medication list Vitamins/Supplements: Co-Q10, Vitamin C (500 mg), vitamin E (200 IU), Calcium (50 mg)  Pertinent labs:  (5/22) Thyroid Panel, CBC - WNL  (5/22) Iron: 198 (high)  (***) Anthropometrics: Ht: *** cm  Wt: *** kg  BMI: *** kg/m^2  IBW based on hamwi equation @ 56.8 kg (+/-10%)  (1/19) Anthropometrics: Ht: 162 cm  Wt: 56.7 kg  BMI: 21.6  IBW based on hamwi equation @ 56.8 kg (+/-10%)  06/13/21 Wt: 59.5 kg 01/31/21 Wt: 56.7 kg 12/27/20 Wt: 56.4 kg 10/30/20 Wt: 57 kg 08/27/20 Wt: 55 kg 07/26/20 Wt: 54 kg 06/22/20 Wt: 52.3 kg 05/31/20 Wt: 54 kg 04/23/20 Wt: 50 kg  Estimated minimum caloric needs: 2000-2500 kcal/day (EER) *** Estimated minimum protein needs: 0.8 g/kg/day (DRI) Estimated minimum fluid needs: 2000-2500 mL/day (1 mL/kcal) ***  Primary concerns today: Follow-up for Gtube dependence.  Mom accompanied pt to appt today.   Dietary Intake Hx: Usual eating pattern includes: 3 meals and 3-4 snacks per day.  How long does it usually take to finish a meal: <30 minutes  Texture modifications: chopped foods  Chewing or swallowing difficulties with foods and/or liquids: coughing only with foods   Usual feeding regimen: 711 mL (3 cans) Ensure clear @ 85 mL/hr for 8.5 hours (9:30 PM - 6 AM); 2 bags of D5 infusions weekly @ 200 mL/hr x 12-14 hours ***  24-hr recall: *** Breakfast: smoothie (pb, banana, oatmeal, 2%  fairlife lactose free) OR 2 packet oatmeal w/ water +  coffee or tea  Lunch: 2 chicken tenders + fruit or chips OR small amount chicken salad w/ crackers + fruit + sweet tea/soda  Dinner: whatever family is having (starch, protein, vegetable) + sweet tea/water   Typical Snacks: mini muffins, fruit, gogurt, kind bars *** Typical Beverages: water (~64 oz), sweet tea (1 cup/day), soda (1 can/day), juice (1-2 bottles depending on blood sugar) *** Supplements: 3 Ensure Clear ***  GI: continued mix of constipation/diarrhea - followed by GI ***  Physical Activity: normal ADL and walking around campus/walking dog  ***  Estimated intake likely meeting needs given adequate growth.  Pt consuming various food groups.  Pt consuming adequate amounts of each food group. ***  Nutrition Diagnosis: (1/19) Inadequate oral intake related to mitochondrial disorder causing decreased appetite and poor PO feeding as evidence by G-tube dependent to meet caloric, protein, and fluid needs.  Intervention: Discussed pt's growth and current regimen. Discussed recommendations below. All questions answered, family in agreement with plan.   Nutrition Recommendations: - *** - Continue eating a wide variety of all food groups (fruits, vegetables, whole grains, lean proteins, low-fat dairy.   Teach back method used.  Monitoring/Evaluation: Continue to Monitor: - Growth trends - PO intake  - TF tolerance  Follow-up in ***.  Total time spent in counseling: *** minutes.

## 2021-07-18 ENCOUNTER — Telehealth: Payer: Self-pay | Admitting: *Deleted

## 2021-07-18 NOTE — Telephone Encounter (Signed)
Patient's mother called in with patient. C/o 2 week hx of RLQ pain. Rates 4-5/10 at present. States, "pain comes and goes, waxes, and wanes." Denies fever/chills, vomiting, diarrhea. Does have a "little bit of nausea, but that is normal for her." Eating and drinking as per her usual. Patient has had many abdominal surgeries. Gallbladder removed but still has appendix. G-tube on left center. Hx of ovarian cysts, constipation. Has had 3-4 normal BMs today. HH RN saw patient today and performed rebound test. Patient was only mildly tender. She has appt tomorrow at 1015. She is strongly encouraged to head to ED if develops fever/chills, increased pain. Mom states agreement.

## 2021-07-19 ENCOUNTER — Encounter (HOSPITAL_COMMUNITY): Payer: Self-pay

## 2021-07-19 ENCOUNTER — Emergency Department (HOSPITAL_COMMUNITY)
Admission: EM | Admit: 2021-07-19 | Discharge: 2021-07-19 | Discharge status: Against Medical Advice | Payer: PRIVATE HEALTH INSURANCE | Attending: Internal Medicine | Admitting: Internal Medicine

## 2021-07-19 ENCOUNTER — Other Ambulatory Visit (HOSPITAL_COMMUNITY): Payer: PRIVATE HEALTH INSURANCE

## 2021-07-19 ENCOUNTER — Inpatient Hospital Stay (HOSPITAL_COMMUNITY)
Admit: 2021-07-19 | Discharge: 2021-07-19 | Disposition: A | Discharge status: Home / Self Care | Payer: PRIVATE HEALTH INSURANCE | Attending: Internal Medicine | Admitting: Internal Medicine

## 2021-07-19 ENCOUNTER — Other Ambulatory Visit: Payer: Self-pay

## 2021-07-19 ENCOUNTER — Ambulatory Visit (INDEPENDENT_AMBULATORY_CARE_PROVIDER_SITE_OTHER): Payer: PRIVATE HEALTH INSURANCE | Admitting: Internal Medicine

## 2021-07-19 ENCOUNTER — Emergency Department (HOSPITAL_COMMUNITY)
Admit: 2021-07-19 | Discharge: 2021-07-19 | Disposition: A | Discharge status: Home / Self Care | Payer: PRIVATE HEALTH INSURANCE | Attending: Internal Medicine | Admitting: Internal Medicine

## 2021-07-19 VITALS — BP 117/63 | HR 89 | Temp 98.2°F | Ht 63.0 in | Wt 132.0 lb

## 2021-07-19 DIAGNOSIS — Z931 Gastrostomy status: Secondary | ICD-10-CM

## 2021-07-19 DIAGNOSIS — R110 Nausea: Secondary | ICD-10-CM | POA: Diagnosis not present

## 2021-07-19 DIAGNOSIS — R1031 Right lower quadrant pain: Secondary | ICD-10-CM

## 2021-07-19 DIAGNOSIS — Z5321 Procedure and treatment not carried out due to patient leaving prior to being seen by health care provider: Secondary | ICD-10-CM | POA: Insufficient documentation

## 2021-07-19 DIAGNOSIS — E8840 Mitochondrial metabolism disorder, unspecified: Secondary | ICD-10-CM | POA: Diagnosis not present

## 2021-07-19 DIAGNOSIS — R109 Unspecified abdominal pain: Secondary | ICD-10-CM | POA: Insufficient documentation

## 2021-07-19 LAB — CBC WITH DIFFERENTIAL/PLATELET
Abs Immature Granulocytes: 0.01 10*3/uL (ref 0.00–0.07)
Basophils Absolute: 0.1 10*3/uL (ref 0.0–0.1)
Basophils Relative: 1 %
Eosinophils Absolute: 0.4 10*3/uL (ref 0.0–0.5)
Eosinophils Relative: 5 %
HCT: 44 % (ref 36.0–46.0)
Hemoglobin: 13.8 g/dL (ref 12.0–15.0)
Immature Granulocytes: 0 %
Lymphocytes Relative: 33 %
Lymphs Abs: 2.6 10*3/uL (ref 0.7–4.0)
MCH: 28.1 pg (ref 26.0–34.0)
MCHC: 31.4 g/dL (ref 30.0–36.0)
MCV: 89.6 fL (ref 80.0–100.0)
Monocytes Absolute: 0.5 10*3/uL (ref 0.1–1.0)
Monocytes Relative: 6 %
Neutro Abs: 4.3 10*3/uL (ref 1.7–7.7)
Neutrophils Relative %: 55 %
Platelets: 366 10*3/uL (ref 150–400)
RBC: 4.91 MIL/uL (ref 3.87–5.11)
RDW: 12.5 % (ref 11.5–15.5)
WBC: 7.8 10*3/uL (ref 4.0–10.5)
nRBC: 0 % (ref 0.0–0.2)

## 2021-07-19 LAB — COMPREHENSIVE METABOLIC PANEL
ALT: 14 U/L (ref 0–44)
AST: 17 U/L (ref 15–41)
Albumin: 4.1 g/dL (ref 3.5–5.0)
Alkaline Phosphatase: 64 U/L (ref 38–126)
Anion gap: 10 (ref 5–15)
BUN: 6 mg/dL (ref 6–20)
CO2: 22 mmol/L (ref 22–32)
Calcium: 9.3 mg/dL (ref 8.9–10.3)
Chloride: 107 mmol/L (ref 98–111)
Creatinine, Ser: 0.88 mg/dL (ref 0.44–1.00)
GFR, Estimated: >60 mL/min (ref >60)
Glucose, Bld: 86 mg/dL (ref 70–99)
Potassium: 4 mmol/L (ref 3.5–5.1)
Sodium: 139 mmol/L (ref 135–145)
Total Bilirubin: 0.7 mg/dL (ref 0.3–1.2)
Total Protein: 7.5 g/dL (ref 6.5–8.1)

## 2021-07-19 LAB — URINALYSIS, ROUTINE W REFLEX MICROSCOPIC
Bacteria, UA: NONE SEEN
Bilirubin Urine: NEGATIVE
Glucose, UA: NEGATIVE mg/dL
Hgb urine dipstick: NEGATIVE
Ketones, ur: NEGATIVE mg/dL
Nitrite: NEGATIVE
Protein, ur: NEGATIVE mg/dL
Specific Gravity, Urine: 1.006 (ref 1.005–1.030)
pH: 7 (ref 5.0–8.0)

## 2021-07-19 LAB — LIPASE, BLOOD: Lipase: 30 U/L (ref 11–51)

## 2021-07-19 LAB — I-STAT BETA HCG BLOOD, ED (MC, WL, AP ONLY): I-stat hCG, quantitative: <5 m[IU]/mL (ref <5)

## 2021-07-19 MED ORDER — IOHEXOL 300 MG/ML  SOLN
100.0000 mL | Freq: Once | INTRAMUSCULAR | Status: AC | PRN
  Administered 2021-07-19: 100 mL via INTRAVENOUS

## 2021-07-19 NOTE — Progress Notes (Signed)
Subjective:  CC: abdominal pain  HPI:  [name] is a 21 y.o. female with a past medical history stated below and presents today for abdominal pain. Please see problem based assessment and plan for additional details.  Past Medical History:  Diagnosis Date   Allergy to alpha-gal    elevated IgE 04/10/16   Autonomic dysfunction    Chiari I malformation (HCC)    Complication of anesthesia    slow to wake up   Dyspnea    Eczema    GERD (gastroesophageal reflux disease)    Headache    Heart murmur    Hypoglycemia    Hypothyroid    IBS (irritable bowel syndrome)    Joint pain    Mitochondrial disease (HCC)    Muscle pain    Pneumonia    several times   PONV (postoperative nausea and vomiting)     Current Outpatient Medications on File Prior to Visit  Medication Sig Dispense Refill   albuterol (ACCUNEB) 0.63 MG/3ML nebulizer solution Take 85ml by nebulizer 3 times per day as needed for shortness of breath 270 mL 5   albuterol (PROVENTIL HFA) 108 (90 Base) MCG/ACT inhaler Inhale 1-2 puffs into the lungs every 4 (four) hours as needed for wheezing or shortness of breath. 18 g 0   ascorbic acid (VITAMIN C) 500 MG tablet Take 500 mg by mouth daily.      azelastine (ASTELIN) 0.1 % nasal spray Place 2 sprays into both nostrils 2 (two) times daily. Use in each nostril as directed 30 mL 5   clobetasol (OLUX) 0.05 % topical foam Apply topically 2 (two) times daily. Apply to affected areas twice daily as needed taking care to avoid axillae and groin area. (Patient not taking: Reported on 06/13/2021) 45 g 3   Coenzyme Q-10 200 MG CAPS Take 1 capsule by mouth daily.     Continuous Blood Gluc Receiver (DEXCOM G6 RECEIVER) DEVI 1 Device by Does not apply route as directed. 1 each 2   Continuous Blood Gluc Sensor (DEXCOM G6 SENSOR) MISC Inject 1 applicator into the skin as directed. (change sensor every 10 days) 3 each 11   Continuous Blood Gluc Transmit (DEXCOM G6 TRANSMITTER) MISC  Inject 1 Device into the skin as directed. (re-use up to 8x with each new sensor) 1 each 3   EPINEPHrine 0.3 mg/0.3 mL IJ SOAJ injection Inject 0.3 mg into the muscle once. As needed (Patient not taking: Reported on 12/27/2020)     Glucagon (BAQSIMI TWO PACK) 3 MG/DOSE POWD Place 1 spray into the nose as directed. (Patient not taking: Reported on 12/27/2020) 2 each 3   HAILEY FE 1/20 1-20 MG-MCG tablet Take 1 tablet by mouth daily.     leucovorin (WELLCOVORIN) 25 MG tablet TAKE 1 TABLET BY MOUTH 2 TIMES A DAY 180 tablet 1   levOCARNitine (CARNITOR) 1 GM/10ML solution TAKE 2 TEASPOONFUL ( ) BY MOUTH TWICEDAILY (Patient taking differently: Place 1,000 mg into feeding tube daily.) 118 mL 12   lidocaine (XYLOCAINE) 5 % ointment Apply 1 application topically as needed. 35.44 g 3   metaxalone (SKELAXIN) 800 MG tablet TAKE 1/2 TABLET (400MG  TOTAL) BY MOUTH DAILY AS NEEDED (MUSCLE PAIN) 30 tablet 3   montelukast (SINGULAIR) 10 MG tablet TAKE 1 TABLET BY MOUTH AT BEDTIME 30 tablet 0   norethindrone-ethinyl estradiol (LOESTRIN) 1-20 MG-MCG tablet Take by mouth. (Patient not taking: Reported on 01/31/2021)     Nutritional Supplements (NUTRITIONAL SUPPLEMENT PLUS) LIQD 3  cartons (711 mL) of Ensure Clear given via gtube daily. 80321 mL 12   Nutritional Supplements (NUTRITIONAL SUPPLEMENT PLUS) LIQD 237-474 mL (1-2 cartons) Boost Breeze (peach flavored only) given po daily. 14694 mL 12   ondansetron (ZOFRAN-ODT) 4 MG disintegrating tablet Take 1 tablet under the tongue every 6-8 hours as needed for nausea. 30 tablet 5   predniSONE (DELTASONE) 20 MG tablet Take 2 tablets daily with breakfast. 10 tablet 0   pregabalin (LYRICA) 25 MG capsule Take 1 capsule (25 mg total) by mouth 2 (two) times daily. 60 capsule 5   pregabalin (LYRICA) 75 MG capsule TAKE 2 CAPSULES BY MOUTH AT BEDTIME 60 capsule 5   promethazine (PHENERGAN) 25 MG tablet Take 1 tablet at onset of migraine. May repeat every 6 hours PRN 30 tablet 0    promethazine-dextromethorphan (PROMETHAZINE-DM) 6.25-15 MG/5ML syrup Take 2.5 mLs by mouth 4 (four) times daily as needed for cough. 100 mL 0   pyridostigmine (MESTINON) 60 MG tablet Take 1 tablet (60 mg total) by mouth in the morning, at noon, in the evening, and at bedtime. 120 tablet 5   RABEprazole (ACIPHEX) 20 MG tablet TAKE 1 TABLET BY MOUTH 2 TIMES A DAY 60 tablet 6   rizatriptan (MAXALT) 10 MG tablet TAKE 1 TABLET BY MOUTH AS NEEDED FOR MIGRAINE. MAY REPEAT IN 2 HOURS IF NEEDED. 12 tablet 3   SYNTHROID 88 MCG tablet TAKE 1 TABLET BY MOUTH EVERY MORNING ON AN EMPTY STOMACH 90 tablet 1   topiramate (TOPAMAX) 25 MG tablet TAKE 1 TABLET BY MOUTH EVERY NIGHT AT BEDTIME 30 tablet 5   Topiramate ER (TROKENDI XR) 50 MG CP24 TAKE 1 CAPSULE BY MOUTH DAILY 30 capsule 5   traZODone (DESYREL) 50 MG tablet TAKE 1/2 TABLET BY MOUTH 30 MINUTES PRIOR TO BEDTIME. IF NEEDED, MAY INCREASE TO 1 TABLET BEFORE BEDTIME 30 tablet 1   vitamin E 200 UNIT capsule Take 200 Units by mouth daily.     No current facility-administered medications on file prior to visit.    Family History  Problem Relation Age of Onset   Asthma Mother    Allergic rhinitis Mother    Heart murmur Mother    Allergic rhinitis Sister    Thyroid disease Sister    Allergic rhinitis Brother    Asthma Brother    Seizures Brother    Mitochondrial disorder Brother    Allergic rhinitis Maternal Grandmother    Asthma Maternal Grandmother    Diabetes type II Maternal Grandmother    Lung cancer Maternal Grandfather    Angioedema Neg Hx    Eczema Neg Hx    Immunodeficiency Neg Hx    Urticaria Neg Hx     Social History   Socioeconomic History   Marital status: Single    Spouse name: Not on file   Number of children: Not on file   Years of education: Not on file   Highest education level: Not on file  Occupational History   Not on file  Tobacco Use   Smoking status: Never   Smokeless tobacco: Never  Vaping Use   Vaping Use:  Never used  Substance and Sexual Activity   Alcohol use: No   Drug use: No   Sexual activity: Never    Birth control/protection: None  Other Topics Concern   Not on file  Social History Narrative   Sharday graduated from [employer]. Attends [employer]. She has graduated and currently taking break then returning for [employer]  at another school.    She enjoys reading, playing with her cat and her dog, and hanging out with her sister.    Social Determinants of Health   Financial Resource Strain: Not on file  Food Insecurity: Not on file  Transportation Needs: Not on file  Physical Activity: Not on file  Stress: Not on file  Social Connections: Not on file  Intimate Partner Violence: Not on file    Review of Systems: ROS negative except for what is noted on the assessment and plan.  Objective:   Vitals:   07/19/21 1014  BP: 117/63  Pulse: 89  Temp: 98.2 F (36.8 C)  TempSrc: Oral  SpO2: 100%  Weight: 132 lb (59.9 kg)  Height: 5\' 3"  (1.6 m)    Physical Exam: Constitutional: well-appearing, in no acute distress Cardiovascular: regular rate and rhythm, no m/r/g Pulmonary/Chest: normal work of breathing on room air, lungs clear to auscultation bilaterally Abdominal: soft,tenderness in RLQ, no rebound or guarding present, g tube in place MSK: normal bulk and tone Neurological: alert & oriented x 3, normal gait Skin: warm and dry Psych: normal mood and affect     Assessment & Plan:  Abdominal pain Ms. Purdue is a 21 year old with complicated medical history including mitochondrial deficiency, feeding intolerance status post G-tube placement, presenting to clinic today due to abdominal pain.  She states that pain has been present over the last month but is worsening.  Pain is located in the right lower quadrant and is a dull ache that waxes and wanes.  Over the last several days she has had difficulty sleeping due to pain.  She has experienced more nausea  requiring Phenergan, but is not vomited.  She denies constipation.  Tube feeds are making her feel nauseated. On exam she has tenderness to right lower quadrant, but without rebound.  G-tube in place with no erythema or discharge. A/P: Presentation concerning for acute appendicitis.  She was initially advised to go to the ED, however with extended wait times, we were able to secure a stat CT at [facility]. -Stat CT abdomen with contrast   Patient discussed with Dr. [facility] Cydnee Fuquay, D.O. [facility]  PGY-2 Pager: [phone]  Phone: [phone] Date 07/19/2021  Time 4:21 PM

## 2021-07-19 NOTE — ED Notes (Signed)
This patient did not answer for updated vitals

## 2021-07-19 NOTE — Assessment & Plan Note (Signed)
Laura Parrish is a 21 year old with complicated medical history including mitochondrial deficiency, feeding intolerance status post G-tube placement, presenting to clinic today due to abdominal pain.  She states that pain has been present over the last month but is worsening.  Pain is located in the right lower quadrant and is a dull ache that waxes and wanes.  Over the last several days she has had difficulty sleeping due to pain.  She has experienced more nausea requiring Phenergan, but is not vomited.  She denies constipation.  Tube feeds are making her feel nauseated. On exam she has tenderness to right lower quadrant, but without rebound.  G-tube in place with no erythema or discharge. A/P: Presentation concerning for acute appendicitis.  She was initially advised to go to the ED, however with extended wait times, we were able to secure a stat CT at [facility]. -Stat CT abdomen with contrast

## 2021-07-19 NOTE — ED Triage Notes (Signed)
Pt arrived from internal med for appendicitis rule out. Pt states the pain has been there for a couple months but got worse last Friday and has been constant.

## 2021-07-19 NOTE — Patient Instructions (Signed)
Thank you, [name] for allowing [place] to provide your care today. Today we discussed:  Abdominal pain: I am concerned for appendicitis.  This is an infection of your appendix which is an organ on the right side of your abdomen.  We are getting an image of your belly to further evaluate.  I have ordered the following labs for you:  Lab Orders  No laboratory test(s) ordered today     Referrals ordered today:   Referral Orders  No referral(s) requested today     I have ordered the following medication/changed the following medications:   Stop the following medications: There are no discontinued medications.   Start the following medications: No orders of the defined types were placed in this encounter.    We look forward to seeing you next time. Please call our clinic at [phone] if you have any questions or concerns. The best time to call is Monday-Friday from 9am-4pm, but there is someone available 24/7. If after hours or the weekend, call the main hospital number and ask for the Internal Medicine Resident On-Call. If you need medication refills, please notify your pharmacy one week in advance and they will send [place] a request.   Thank you for trusting me with your care. Wishing you the best!   Rudene Christians, DO [facility]

## 2021-07-19 NOTE — Progress Notes (Signed)
Patient: Laura Parrish MRN: [id number] Sex: female DOB: 11/19/2000  Provider: [name], MD Location of Care: Pediatric Specialist- Pediatric Complex Care Note type: Routine return visit  History of Present Illness: Referral Source: Laura Likens, MD History from: patient and prior records Chief Complaint: complex care  [name] is a 21 y.o. 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 Patient was last seen 01/31/21 where I recommended skelaxin for back pain and continued all other medications.  Since that appointment, patient has been seen in the ED for viral illness on 05/16/21 and 05/21/21.   Patient presents today with her parents. They report their largest concern is her stomach pain.   Symptom management:  Stomach Pain: Patient reports that she continues to have stomach pain that was diagnosed as constipation. She was surprised by this as she continued to stool BID with normal consistency. Tried 4 capfuls of Miralax for  4 days however this caused too much bloating and she stopped on Sunday. Has continued from then to take 3 Senna tablets very night, and has also taken a full bottle of magnesium citrate, and has been using suppositories twice this week. This has helped a little however, the pain continues, despite increased stooling, she reports pain is 6 or 7. Wonders if it could be related to her endometriosis.  Have tried 400 mg ibuprofen and one tablet of naproxen for pain however, they have not been helpful. Takes Lyrica twice daily staring this week as well.    Headaches: She as been having pain in the back of her head that is pounding, happening every two weeks. With light, smell, and noise sensitivity. For this will take phenergan which helps with nausea but maxalt does not help. Will continue through sleep sometimes, can last a day or two. Continues to take 50 mg trokendi and 25 mg of topamax at night hesitant to increase this as it causes brain fog. Sleeps well, and also takes naps most days.   Mitochondrial Disease: Reports her memory has decreased. Mom notes energy levels seem decreased too. Endurance and strength decreased. Continues to take mestinon with no side effects, is unsure if this helps her energy. Has tried coffee and energy drinks however, these make her sleepy. Would be interested in trying short acting stimulants to determine if it helps with this.  Neuropathy: Continues to have swelling in her hands and feet. Can lose sensation with this as well. Only happen occasionally. Impacts function, she cannot type or open bottles and things like this.   Care coordination (other providers): She had a g-tube change with Dr. Sheliah Hatch on 02/07/21 and 06/19/21. They plan to discuss her GI pain with him. They has not seen Dr. Dalbert Garnet, for OBGYN recently, but would be interested in following up with her to evaluate for endometriosis flare up.   She saw Dr. Fransico Michael on 06/13/21 who continued synthroid and rabeprazole as well as recommended follow up in 3 mo.   Care management needs:  She is taking a gap year between college and graduate school right now to work on her health.   Decision making/Advanced care planning: She reports her biggest goals would be to improve  her neuropathy and her energy levels.   Past Medical History Past Medical History:  Diagnosis Date   Allergy to alpha-gal    elevated IgE 04/10/16   Autonomic dysfunction    Chiari I malformation (HCC)    Complication of anesthesia    slow to wake up   Dyspnea    Eczema    GERD  (gastroesophageal reflux disease)    Headache    Heart murmur    Hypoglycemia    Hypothyroid    IBS (irritable bowel syndrome)    Joint pain    Mitochondrial disease (HCC)    Muscle pain    Pneumonia    several times   PONV (postoperative nausea and vomiting)     Surgical History Past Surgical History:  Procedure Laterality Date   ADENOIDECTOMY     2004or 2005   chiari decompression  2007   GALLBLADDER SURGERY  2015   GASTROSTOMY TUBE PLACEMENT     x 2   IR CV LINE INJECTION  07/06/2020   MUSCLE BIOPSY  2010   Port Replacement  05/2018   x 2 orignal 08/2016 then replacement 05/2018   PORT-A-CATH REMOVAL N/A 03/24/2019   Procedure: PORT-A-CATH REMOVAL;  Surgeon: Rodman Pickle, MD;  Location: [facility] OR;  Service: General;  Laterality: N/A;   PORTACATH PLACEMENT Right 03/24/2019   Procedure: PORT-A-CATH INSERTION under ultrasound and fluoroscopic guidance;  Surgeon: Rodman Pickle, MD;  Location: [facility] OR;  Service: General;  Laterality: Right;   REMOVAL OF GASTROSTOMY TUBE N/A 03/24/2019   Procedure: Exchange of Gastrostomy Tube;  Surgeon: Rodman Pickle, MD;  Location: [facility] OR;  Service: General;  Laterality: N/A;   tethered cord  07/2006   TONSILLECTOMY     [id number]    Family History family history includes Allergic rhinitis in her brother, maternal grandmother, mother, and sister; Asthma in her brother, maternal grandmother, and mother; Diabetes type II in her maternal grandmother; Heart murmur in her mother; Lung cancer in her maternal grandfather; Mitochondrial disorder in her brother; Seizures in her brother; Thyroid disease in her sister.   Social History Social History   Social History Narrative   Laura Parrish graduated from [employer]. Graduated from [employer].  currently taking break then returning for masters at another school.    She enjoys reading, playing with her cat and her dog, and hanging out with her sister.      Allergies Allergies  Allergen Reactions   Other Anaphylaxis    Alpha Gal allergy \ Red meat\ any hoof animal   Latex Rash   Lactated Ringers     Contraindicated w/ mitochondrial syndrome   Propofol Other (See Comments)    Due to possible mitochondrial disorder Due to possible mitochondrial disorder Due to possible mitochondrial disorder   Tape Itching and Other (See Comments)    Medications Current Outpatient Medications on File Prior to Visit  Medication Sig Dispense Refill   albuterol (ACCUNEB) 0.63 MG/3ML nebulizer solution Take 9ml by nebulizer 3 times per day as needed for shortness of breath 270 mL 5   ascorbic acid (VITAMIN C) 500 MG tablet Take 500 mg by mouth daily.      azelastine (ASTELIN) 0.1 % nasal spray Place 2 sprays into both nostrils 2 (two) times daily. Use in each nostril as directed 30 mL 5   clobetasol (OLUX) 0.05 % topical foam Apply topically 2 (two) times daily. Apply to affected areas twice daily as needed taking care to avoid axillae  and groin area. 45 g 3   Coenzyme Q-10 200 MG CAPS Take 1 capsule by mouth daily.     Continuous Blood Gluc Sensor (DEXCOM G6 SENSOR) MISC Inject 1 applicator into the skin as directed. (change sensor every 10 days) 3 each 11   Continuous Blood Gluc Transmit (DEXCOM G6 TRANSMITTER) MISC Inject 1 Device into the skin as directed. (re-use up to 8x with each new sensor) 1 each 3   HAILEY FE 1/20 1-20 MG-MCG tablet Take 1 tablet by mouth daily.     leucovorin (WELLCOVORIN) 25 MG tablet TAKE 1 TABLET BY MOUTH 2 TIMES A DAY 180 tablet 1   levOCARNitine (CARNITOR) 1 GM/10ML solution TAKE 2 TEASPOONFUL ( ) BY MOUTH TWICEDAILY (Patient taking differently: Place 1,000 mg into feeding tube daily.) 118 mL 12   lidocaine (XYLOCAINE) 5 % ointment Apply 1 application topically as needed. 35.44 g 3   montelukast (SINGULAIR) 10 MG tablet TAKE 1 TABLET BY MOUTH AT BEDTIME 30 tablet 0   Nutritional Supplements (NUTRITIONAL SUPPLEMENT PLUS)  LIQD 3 cartons (711 mL) of Ensure Clear given via gtube daily. 51884 mL 12   Nutritional Supplements (NUTRITIONAL SUPPLEMENT PLUS) LIQD 237-474 mL (1-2 cartons) Boost Breeze (peach flavored only) given po daily. 14694 mL 12   polyethylene glycol powder (GLYCOLAX/MIRALAX) 17 GM/SCOOP powder 17 g or one capful in G-Tube. 255 g 3   RABEprazole (ACIPHEX) 20 MG tablet TAKE 1 TABLET BY MOUTH 2 TIMES A DAY 60 tablet 6   SYNTHROID 88 MCG tablet TAKE 1 TABLET BY MOUTH EVERY MORNING ON AN EMPTY STOMACH 90 tablet 1   vitamin E 200 UNIT capsule Take 200 Units by mouth daily.     albuterol (PROVENTIL HFA) 108 (90 Base) MCG/ACT inhaler Inhale 1-2 puffs into the lungs every 4 (four) hours as needed for wheezing or shortness of breath. (Patient not taking: Reported on 07/25/2021) 18 g 0   Continuous Blood Gluc Receiver (DEXCOM G6 RECEIVER) DEVI 1 Device by Does not apply route as directed. (Patient not taking: Reported on 07/25/2021) 1 each 2   EPINEPHrine 0.3 mg/0.3 mL IJ SOAJ injection Inject 0.3 mg into the muscle once. As needed (Patient not taking: Reported on 07/25/2021)     Glucagon (BAQSIMI TWO PACK) 3 MG/DOSE POWD Place 1 spray into the nose as directed. (Patient not taking: Reported on 12/27/2020) 2 each 3   norethindrone-ethinyl estradiol (LOESTRIN) 1-20 MG-MCG tablet Take by mouth. (Patient not taking: Reported on 01/31/2021)     ondansetron (ZOFRAN-ODT) 4 MG disintegrating tablet Take 1 tablet under the tongue every 6-8 hours as needed for nausea. (Patient not taking: Reported on 07/25/2021) 30 tablet 5   predniSONE (DELTASONE) 20 MG tablet Take 2 tablets daily with breakfast. (Patient not taking: Reported on 07/25/2021) 10 tablet 0   No current facility-administered medications on file prior to visit.   The medication list was reviewed and reconciled. All changes or newly prescribed medications were explained.  A complete medication list was provided to the patient/caregiver.  Physical Exam BP 110/66    Ht 5\' 4"  (1.626 m)   Wt 124 lb 1.9 oz (56.3 kg)   BMI 21.30 kg/m  Weight for age: Facility age limit for growth %iles is 20 years.  Length for age: Facility age limit for growth %iles is 20 years. BMI: Body mass index is 21.3 kg/m. No results found. General: NAD, well nourished  HEENT: normocephalic, no eye or nose discharge.  MMM  Cardiovascular: warm and well perfused Lungs:  Normal work of breathing, no rhonchi or stridor Skin: No birthmarks, no skin breakdown Abdomen: soft, non tender, non distended Extremities: No contractures or edema. Neuro: EOM intact, face symmetric. Moves all extremities equally and at least antigravity. No abnormal movements. Normal gait.     Diagnosis:  1. Mitochondrial disease (HCC)   2. Chronic fatigue   3. Complex care coordination   4. Chiari I malformation (HCC)   5. Migraine without aura and without status migrainosus, not intractable   6. Port-A-Cath in place   7. Feeding by G-tube (HCC)   8. Ehlers-Danlos syndrome   9. Myasthenia gravis-like symptoms      Assessment and Plan Ilean Chinallie M Kalman is a 21 y.o. female with history of Chairi malformation s/p repair, tethered cord s/p repair, feeding intolerance s/p gastrostomy tube, lactose intolerance and alpha gal allergy, multiple hormonal irregularities including hypothyroidism, chronic headaches, chronic GI complaints, neuropathic pain in her feet, and symptoms concerning for mitochondrial diease although genetic testing and muscle biopsy unrevealing who presents for follow-up in the pediatric complex care clinic.  Patient seen by case manager, dietician, integrated behavioral health today as well, please see accompanying notes.  I discussed case with all involved parties for coordination of care and recommend patient follow their instructions as below.   Symptom management:  Stomach Pain: Discussed the possible causes of her stomach pain with the family today including constipation or endometriosis.  Recommend that they address the constipation by using both Miralax and Senna as well as suppositories. It is possible that this pain is also caused by endometriosis for which I recommended that she follow up with OBGYN. I also recommended consistent use of ibuprofen or naproxen to try to address pain.    Headaches: Headaches she is having twice a week are most consistent with migraine. Will not increase preventative medication for now to prevent brain fog however, would like to try a new abortive medication, Rimegepant,  to stop migraines. Provided 4 samples of this today.   Mitochondrial Disease: Reports her memory and energy level have decreased. Mom notes energy levels seem decreased too. Endurance and strength decreased as well. To address this will try stimulants to improve her mental and physical energy.   Neuropathy: Continues to have swelling in her hands and feet. Can lose sensation with this as well. Recommend that she try to decrease fluids to 1L a day to assist with these symptoms.   - For stomach pain, recommended consistent use of ibuprofen or naproxen to try to address pain - Provided samples of alternatives for abortive headache medication, Rimegepant, to try to use when she has a migraine - Started 10 mg methylphenidate to help with energy levels and mental focus  - Recommend she decrease fluids to 1L a day to assist with swelling in hands and feet. Additionally recommended compression socks and gloves.  Care coordination: - Recommend she continue with G-tube change with Dr. Sheliah HatchKinsinger later today, talk with him about the stomach pain.  - Recommend she schedule an appointment with Dr. Dalbert GarnetBeasley to discuss potential Endometriosis. - Placed an order for an adult GI at [facility].  - [facility] to [facility] for wisdom teeth removal. - Assisted with consents for obtaining previous genetic testing from [facility] for Dr. Roetta SessionsGuo so that she may better counsel the patient on her genetic  reports.  Equipment needs:  - No equipment needs discussed at this visit.   Decision making/Advanced care planning: - Patient remains at full code, her biggest goals would  be to improve her neuropathy and her energy levels.   The CARE PLAN for reviewed and revised to represent the changes above.  This is available in Epic under snapshot, and a physical binder provided to the patient, that can be used for anyone providing care for the patient.   I spent 55 minutes on day of service on this patient including review of chart, discussion with patient and family, discussion of screening results, coordination with other providers and management of orders and paperwork.     Return in about 3 months (around 10/25/2021).  I, Mayra Reel, scribed for and in the presence of [name], MD at today's visit on 07/25/2021.   I, [name] MD MPH, personally performed the services described in this documentation, as scribed by Mayra Reel in my presence on 07/25/2021 and it is accurate, complete, and reviewed by me.   [name] MD MPH Neurology,  Neurodevelopment and Neuropalliative care [facility] Child Neurology  [address] [place], [place], [place] [address] Phone: [phone] Fax: [phone]

## 2021-07-19 NOTE — ED Notes (Signed)
Pt called for repeat vs, no answer x2

## 2021-07-19 NOTE — ED Provider Triage Note (Signed)
Emergency Medicine Provider Triage Evaluation Note  Laura Parrish , a 21 y.o. female  was evaluated in triage.  Pt complains of right lower quadrant abdominal pain.  Patient sent up from the internal medicine clinic to rule out appendicitis.  She has a an extensive and very complicated past medical history including a genetic mitochondrial disorder.  She is not allowed to have LR, propofol, or fasting for longer than 2 hours at a time.  The patient has a history of endometriosis and ovarian cysts.  She states that this feels different.  She has a G-tube for feeding and also has a Port-A-Cath in place.  Work-up initiated  Review of Systems  Positive: Right lower quadrant pain Negative: Vomiting  Physical Exam  BP 120/67 (BP Location: Right Arm)   Pulse 73   Temp 98.4 F (36.9 C) (Oral)   Resp 20   SpO2 99%  Gen:   Awake, no distress   Resp:  Normal effort  MSK:   Moves extremities without difficulty  Other:  No tenderness in the right lower quadrant  Medical Decision Making  Medically screening exam initiated at 1:17 PM.  Appropriate orders placed.  Laura Parrish was informed that the remainder of the evaluation will be completed by another provider, this initial triage assessment does not replace that evaluation, and the importance of remaining in the ED until their evaluation is complete.  Right lower quadrant pain, work-up initiated   Arthor Captain, PA-C 07/19/21 1320

## 2021-07-20 ENCOUNTER — Telehealth (INDEPENDENT_AMBULATORY_CARE_PROVIDER_SITE_OTHER): Payer: PRIVATE HEALTH INSURANCE | Admitting: Internal Medicine

## 2021-07-20 DIAGNOSIS — R1031 Right lower quadrant pain: Secondary | ICD-10-CM

## 2021-07-20 MED ORDER — POLYETHYLENE GLYCOL 3350 17 GM/SCOOP PO POWD: ORAL | 3 refills | Status: DC

## 2021-07-20 NOTE — Telephone Encounter (Signed)
I called and talked with patients momto review CT results showing contipation. She use miralax and senna over the weekend and follow-up in clinic next week. She states that she has senna at home. -Miralax, senna

## 2021-07-22 ENCOUNTER — Encounter: Payer: PRIVATE HEALTH INSURANCE | Admitting: Student

## 2021-07-22 NOTE — Progress Notes (Signed)
Internal Medicine Clinic Attending  Case discussed with Dr. Masters  At the time of the visit.  We reviewed the resident's history and exam and pertinent patient test results.  I agree with the assessment, diagnosis, and plan of care documented in the resident's note.  

## 2021-07-25 ENCOUNTER — Encounter: Payer: PRIVATE HEALTH INSURANCE | Admitting: Internal Medicine

## 2021-07-25 ENCOUNTER — Ambulatory Visit (INDEPENDENT_AMBULATORY_CARE_PROVIDER_SITE_OTHER): Payer: PRIVATE HEALTH INSURANCE | Admitting: Pediatrics

## 2021-07-25 ENCOUNTER — Encounter: Payer: Self-pay | Admitting: Internal Medicine

## 2021-07-25 ENCOUNTER — Encounter (INDEPENDENT_AMBULATORY_CARE_PROVIDER_SITE_OTHER): Payer: Self-pay | Admitting: Pediatrics

## 2021-07-25 ENCOUNTER — Ambulatory Visit (INDEPENDENT_AMBULATORY_CARE_PROVIDER_SITE_OTHER): Payer: PRIVATE HEALTH INSURANCE | Admitting: Dietician

## 2021-07-25 ENCOUNTER — Other Ambulatory Visit: Payer: Self-pay

## 2021-07-25 VITALS — BP 110/66 | Ht 64.0 in | Wt 124.1 lb

## 2021-07-25 DIAGNOSIS — E8840 Mitochondrial metabolism disorder, unspecified: Secondary | ICD-10-CM

## 2021-07-25 DIAGNOSIS — G43009 Migraine without aura, not intractable, without status migrainosus: Secondary | ICD-10-CM | POA: Diagnosis not present

## 2021-07-25 DIAGNOSIS — Q796 Ehlers-Danlos syndrome, unspecified: Secondary | ICD-10-CM

## 2021-07-25 DIAGNOSIS — G935 Compression of brain: Secondary | ICD-10-CM | POA: Diagnosis not present

## 2021-07-25 DIAGNOSIS — Z931 Gastrostomy status: Secondary | ICD-10-CM

## 2021-07-25 DIAGNOSIS — Z7189 Other specified counseling: Secondary | ICD-10-CM | POA: Diagnosis not present

## 2021-07-25 DIAGNOSIS — G7000 Myasthenia gravis without (acute) exacerbation: Secondary | ICD-10-CM

## 2021-07-25 DIAGNOSIS — Z95828 Presence of other vascular implants and grafts: Secondary | ICD-10-CM

## 2021-07-25 DIAGNOSIS — R5382 Chronic fatigue, unspecified: Secondary | ICD-10-CM

## 2021-07-25 NOTE — Patient Instructions (Signed)
Nutrition Recommendations: - Continue eating a wide variety of all food groups (fruits, vegetables, whole grains, lean proteins, low-fat dairy.  - Try "P" fruits for constipation relief (peaches, pears, plums)  - Continue pre and pro biotics, increased water and increased physical activity to help with constipation relief. A warm shower or bath may help as well.  - Prebiotic foods: artichokes, garlic, onions, bananas, asparagus, barley, oatmeal, apples, flaxseed, wheat bran  - Probiotic foods: yogurt, sauerkraut, kefir, pickles, tempeh, kombucha - Continue 1-2 boost breeze daily in addition to your ensure clear.  - I recommend talking to a GI doctor about your constipation as well.

## 2021-07-25 NOTE — Patient Instructions (Addendum)
-   Try methylphenidate to assist with energy levels.  - Try decreasing fluid to 1L for swelling in your hands and feet. You can also try compression sleeves and gloves. - Try 600 mg of ibuprofen every 6 hours for 1-3 days or two naproxen tablets twice a day for 1-3 days.  - G-tube change with Dr. Sheliah Hatch later today, talk with him about the stomach pain.  - Schedule an appointment with Dr. Dalbert Garnet to discuss potential Endometriosis. - Placed an order for an adult GI at [facility]. Phone: [phone] - I will refer to [facility] dentistry for wisdom teeth removal. Phone: [phone] - We will send he paperwork back to Dr. Roetta Sessions. - Follow up with Dr. Fransico Michael on 09/18/21 - Scheduled with Dr. Artis Flock and Delorise Shiner on 10/24/21

## 2021-07-25 NOTE — Progress Notes (Signed)
This encounter was created in error - please disregard.  Patient checked into clinic but was not able to be seen as she had an appoint at 3:40 for follow-up on G-tube.

## 2021-07-26 ENCOUNTER — Telehealth (INDEPENDENT_AMBULATORY_CARE_PROVIDER_SITE_OTHER): Payer: PRIVATE HEALTH INSURANCE | Admitting: Internal Medicine

## 2021-07-26 NOTE — Telephone Encounter (Signed)
I called and spoke with Ebonye and her mom this afternoon.  I saw her in clinic on Friday, 7/7, at which time she was having right lower quadrant pain.  She underwent a stat CT which was negative for acute appendicitis but showed a large amount of stool.  They followed up in clinic 7/13, but unfortunately were not able to be seen prior to when they had to leave for an appointment to evaluate her G-tube.  Since last Friday they have been giving hourly senna MiraLAX, and Dulcolax suppositories.  MiraLAX was used Saturday Sunday and Monday, but made her feel bloated and then switch to suppositories at that time.  She did have a bloody bowel movement this morning that was painful in her abdomen and rectum.  I talked with her mom about how we can best provide support in the setting of having multiple specialist taking part in Johari's care.  At this time, they plan to follow-up with Dr. Dalbert Garnet her OB/GYN to be further evaluated for endometriosis.  There is some concern that her oral contraceptive that has iron in it could be contributing to constipation.  On review the low Loestrin has about 25 mg/week of iron.  They will also be following up with Dr. Sheliah Hatch with surgery for a G-tube exchange in a few weeks. A/P: Presentation consistent with constipation.  I encouraged him to continue suppositories and senna.  It is possible that her birth control could be contributing but this still small amounts of iron per week.  I encouraged them to follow-up with [facility] next week if she continues to have abdominal pain.

## 2021-07-29 ENCOUNTER — Other Ambulatory Visit: Payer: Self-pay | Admitting: Pediatrics

## 2021-07-29 ENCOUNTER — Encounter (INDEPENDENT_AMBULATORY_CARE_PROVIDER_SITE_OTHER): Payer: Self-pay | Admitting: Pediatrics

## 2021-07-29 DIAGNOSIS — Z931 Gastrostomy status: Secondary | ICD-10-CM

## 2021-07-29 DIAGNOSIS — K580 Irritable bowel syndrome with diarrhea: Secondary | ICD-10-CM

## 2021-07-29 MED ORDER — TOPIRAMATE 25 MG PO TABS: 25.0000 mg | ORAL_TABLET | Freq: Every day | ORAL | 5 refills | Status: DC

## 2021-07-29 MED ORDER — RIZATRIPTAN BENZOATE 10 MG PO TABS: ORAL_TABLET | ORAL | 3 refills | Status: DC

## 2021-07-29 MED ORDER — PROMETHAZINE HCL 25 MG PO TABS: ORAL_TABLET | ORAL | 0 refills | Status: DC

## 2021-07-29 MED ORDER — METHYLPHENIDATE HCL ER 10 MG PO TBCR: 10.0000 mg | EXTENDED_RELEASE_TABLET | Freq: Every day | ORAL | 0 refills | Status: DC

## 2021-07-29 MED ORDER — PREGABALIN 25 MG PO CAPS: 25.0000 mg | ORAL_CAPSULE | Freq: Two times a day (BID) | ORAL | 5 refills | Status: DC

## 2021-07-29 MED ORDER — TOPIRAMATE ER 50 MG PO CAP24: ORAL_CAPSULE | ORAL | 5 refills | Status: DC

## 2021-07-29 MED ORDER — PYRIDOSTIGMINE BROMIDE 60 MG PO TABS: 60.0000 mg | ORAL_TABLET | Freq: Four times a day (QID) | ORAL | 5 refills | Status: DC

## 2021-07-29 MED ORDER — METAXALONE 800 MG PO TABS: ORAL_TABLET | ORAL | 3 refills | Status: DC

## 2021-07-29 MED ORDER — METHYLPHENIDATE HCL 10 MG PO TABS: ORAL_TABLET | ORAL | 0 refills | Status: DC

## 2021-07-29 MED ORDER — TRAZODONE HCL 50 MG PO TABS
ORAL_TABLET | ORAL | 1 refills | Status: DC
Start: 2021-07-29 — End: 2022-03-12

## 2021-07-29 NOTE — Addendum Note (Signed)
Addended by: Margurite Auerbach on: 07/29/2021 08:33 PM   Modules accepted: Orders

## 2021-08-13 ENCOUNTER — Other Ambulatory Visit (INDEPENDENT_AMBULATORY_CARE_PROVIDER_SITE_OTHER): Payer: Self-pay | Admitting: Pediatrics

## 2021-08-15 ENCOUNTER — Encounter (INDEPENDENT_AMBULATORY_CARE_PROVIDER_SITE_OTHER): Payer: Self-pay

## 2021-08-23 ENCOUNTER — Encounter (INDEPENDENT_AMBULATORY_CARE_PROVIDER_SITE_OTHER): Payer: Self-pay | Admitting: Pediatric Genetics

## 2021-09-03 ENCOUNTER — Telehealth (INDEPENDENT_AMBULATORY_CARE_PROVIDER_SITE_OTHER): Payer: Self-pay | Admitting: Pediatrics

## 2021-09-03 NOTE — Telephone Encounter (Signed)
  Name of who is calling:  Caller's Relationship to Patient: n/a  Best contact number:[phone]  Provider they see:   Reason for call: Message for [facility], caller mentioned a referral they sent in from their oral surgeon she was asking if you could give her a call back when you got a chance.      PRESCRIPTION REFILL ONLY  Name of prescription:  Pharmacy:

## 2021-09-04 ENCOUNTER — Other Ambulatory Visit (INDEPENDENT_AMBULATORY_CARE_PROVIDER_SITE_OTHER): Payer: Self-pay | Admitting: "Endocrinology

## 2021-09-04 DIAGNOSIS — E162 Hypoglycemia, unspecified: Secondary | ICD-10-CM

## 2021-09-04 NOTE — Telephone Encounter (Signed)
Mom called to inform that they still have not heard from [facility] dentistry. She called them and they informed her it takes 2-3 weeks for the referrals to be processed but they still did not have it. Mom is concerned about this taking a long time as Darwin is in increasing amounts of pain.   Agreed to send referral to mom as well as call [facility] and follow up on referral.   Mom also wonders if there is another place we could try to get this done in a quicker time, wonders about [facility].

## 2021-09-12 LAB — IRON: Iron: 107 ug/dL (ref 40–190)

## 2021-09-12 LAB — CBC WITH DIFFERENTIAL/PLATELET
Absolute Monocytes: 608 cells/uL (ref 200–950)
Basophils Absolute: 52 cells/uL (ref 0–200)
Basophils Relative: 0.5 %
Eosinophils Absolute: 268 cells/uL (ref 15–500)
Eosinophils Relative: 2.6 %
HCT: 44.4 % (ref 35.0–45.0)
Hemoglobin: 15 g/dL (ref 11.7–15.5)
Lymphs Abs: 2163 cells/uL (ref 850–3900)
MCH: 29.1 pg (ref 27.0–33.0)
MCHC: 33.8 g/dL (ref 32.0–36.0)
MCV: 86 fL (ref 80.0–100.0)
MPV: 9.9 fL (ref 7.5–12.5)
Monocytes Relative: 5.9 %
Neutro Abs: 7210 cells/uL (ref 1500–7800)
Neutrophils Relative %: 70 %
Platelets: 342 10*3/uL (ref 140–400)
RBC: 5.16 10*6/uL — ABNORMAL HIGH (ref 3.80–5.10)
RDW: 12.4 % (ref 11.0–15.0)
Total Lymphocyte: 21 %
WBC: 10.3 10*3/uL (ref 3.8–10.8)

## 2021-09-12 LAB — T3, FREE: T3, Free: 3 pg/mL (ref 2.3–4.2)

## 2021-09-12 LAB — T4, FREE: Free T4: 1.2 ng/dL (ref 0.8–1.8)

## 2021-09-12 LAB — TSH: TSH: 2.11 mIU/L

## 2021-09-12 NOTE — Telephone Encounter (Signed)
Sarah,   Please call mom with information I provided about talking to mito specialist and potential local adult dentist. Let her know your recommendation, I don't know any adult dentists so she may need to ask around.   [name] MD MPH

## 2021-09-17 NOTE — Progress Notes (Unsigned)
Subjective:  Subjective  Patient Name: Laura Parrish Date of Birth: 2000-12-30  MRN: [id number]  Laura Parrish  presents for her clinic visit today for follow up evaluation and management of her acquired hypothyroidism due to Hashimoto's thyroiditis, goiter, chronic fatigue, dyspepsia, GERD, and many other health problems.   HISTORY OF PRESENT ILLNESS:   Laura Parrish is a 21 y.o. Caucasian young woman.   Laura Parrish was accompanied by her mother.  1. Laura Parrish's initial pediatric endocrine clinic visit here at [facility] occurred on 01/29/18. The following information is a composite of the history gained at her initial visit, information gained after 7 hours of reviewing her records in [place], and information gleaned on 02/05/18:   A. Laura Parrish has had a very complicated medical and surgical history. She has been hospitalized many times during her life. Her problem list includes the following:    1). Chronic GI problems, with frequent constipation, frequent diarrhea, BMs occurring immediately after eating, nausea, abdominal pains, and feeding difficulties requiring the use of both oral and nocturnal G-tube feedings: Previous EGD and colonoscopy have been unremarkable. TTG IgA was negative on 12/29/17.    2). Chronic allergies, including seasonal allergies, allergies to other environmental agents, latex, Propofol, fish and shellfish according to rashes but not to testing, and Alpha-Gal allergy with a positive result, but not specifically beef, pork, or lamb;   3). Chronic fatigue   4). Iron deficiency anemia   5). Hypothyroidism with elevated TPO and thyroglobulin antibodies, c/w Hashimoto's thyroiditis;       6). Muscle weakness and myalgias   7). 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 Family was offered treatment with Florinef, but declined because their pharmacist advised against starting Florinef due to possible adverse effects.     8).  Intermittent dehydration requiring administration of iv fluids via her port-a-cath every 6 weeks, but more recently every 2 weeks   9). Chronic headaches, to include migraines             10). 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 Chronic swelling of her hands and feet   13). Chronic pain of her back, joints, legs, and feet   14). Skin rashes, hives, eczema, and flushing   15). Irregular menses and menorrhagia   16). Nocturnal hypoglycemia if she does not have nocturnal pump feedings.    17). Chronic illness with low grade fevers, sometimes up to 101, but mostly 99-100 degrees   18). 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 However, her testing did not support that hypothesis. Mom has the same genetic variant, but not all of the same problems.   B. Perinatal history: Gestational Age: [redacted]w[redacted]d; 5 lb 7 oz (2.466 kg); Healthy newborn, except noted to have a right club foot deformity.    C. Infancy: She had to be hospitalized several times for unexplained dehydration. She had club foot surgery at about 5 weeks of age.   D. Childhood:   1). She was sick a lot.    2). She had PT for her club foot.    3). A Chiari malformation was discovered at age 18. She had decompression surgery  in 2007.    4). In 2008 she was discovered at [facility] to have a tethered cord. She had a release procedure in 2008.   5). In 2010 she had a muscle BX that showed mild variation in fiber size, more notable in Type I fibers with rare atrophic Type I fibers. EM showed that the normal sarcomeric architecture was preserved. Mitochondria were morphologically unremarkable. Dr. 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    6). In 2010 she continued to have severe problems with lack of appetite, nausea, and feeding difficulties. In late 2010 she had a G-tube placed.    7). Genetic testing was performed at [facility] on 09/02/08. Karyotype and chromosomal array were normal. 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 Mitochondrial DNA copy number in muscle tissue was normal, 123% of the mean value of the age and tissue tested controls.   8). On 10/18/12 Shereece's TFTs were c/w primary hypothyroidism. Levothyroxine treatment was initiated.    9). She had a cholecystectomy in June 2015 for biliary hypokinesia.    10). A port-a-cath was placed in August 2018.    11). In December 2018 she saw a geneticist, Dr. Penni Bombard, in [place] for evaluation of a mitochondrial dysfunction. 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 Targeted GeneDX testing was performed in June-July 2019 and reportedly showed allergies in both Delta and her brother. Testing in Shamira's brother also showed genetic tendencies for seizures and developmental delays.     E. Chief complaint:   1). Hypothyroidism was diagnosed on 18 October 2012 after several years of variable thyroid hormone test results. Her TSH was high at 10.35 (ref 0.5-4.5) and free T4 was low at 0.67 (ref 0.80-2.0). TPO antibody and thyroglobulin antibody were both reportedly positive. She started thyroid hormone at about that time. She takes generic levothyroxine, 50 mcg/day. That dose had never been changes. She did not think that she had ever had thyroid swelling or pain. Mom said that Laura Parrish's TSH and free T4 values have been normal. However, her TSH was elevated again in February 2019.   2). Hypoglycemia: At about age 89 she had low BGs almost all the time. If she did not get G-tube feedings at night, the BGs in the  mornings could be in the 40s-50s. She had never been evaluated for adrenal insufficiency. If her BGs are low during the day she can get dizzy.   3). Dizziness: She has been dizzy for many years. The dizziness was originally ascribed to her Arnold-Chiari malformation, but did not improve after surgery. About 30% of her dizziness was associated with low BGs. Some of her dizziness was a spinning dizziness.  Some of her dizziness appears to be orthostatic dizziness. This latter dizziness occurs almost every morning when she sits up from a supine or prone position. The dizziness can also occur if she stands up too fast from a seated position. Whenever she gets dizzy for any reason, she often gets black spots in front of her eyes and feels like she will pass out.   4). Abnormal periods and excessive menstrual and intermenstrual bleeding: Menarche occurred at age 33. Periods have never been regular. When she took most OCPs she had persistent vaginal bleeding. Seasonale caused her to have menses every 3 months for about one year, then it became less effective. She continued to have heavy spotting. This problem has been sometimes attributed  to a connective tissue disorder.    5). 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 Her initial [facility] consultation occurred on 03/15/12.      1). The report of her 2009 muscle BX was reviewed. The genetics reports from [facility] done in July 2010 and from [facility] in 2010 were reviewed. Also reviewed was a report from Dr. Salomon Fick of the [facility] who did not endorse support for a diagnosis of mitochondrial disease.      2). The [facility] geneticist noted that none of Janeya's previous testing supported a diagnosis of a mitochondrial disorder. The geneticist also stated that no specific genetic disorders are known to cause her constellation of symptoms shared by her and her younger brother, including Chiari I malformation. Genome sequencing  testing, to determine if Jhane and her brother had a unique gene mutation underlying their presentations, was offered to the family. Unfortunately the costs of that testing were too high for the family to agree to the testing     B). Her [facility] record note from 10/15/15 shows that Terran's previous tests for myopathy and mitochondrial function were all negative. Genetic whole exome testing was negative for a 21 y.o. female with Chiari I malformation, fatigue, muscle weakness, feeding difficulties, tethered cord, and clinical diagnosis of Ehlers-Danlos syndrome. GeneDX testing showed a variant in the mitochondrial genome of uncertain significance that was found in 200% of Jamila's mitochondria and 100% of her mother's mitochondria. Brother presumably had the same variant. It was noted that since Onton and Braxton inherited the same change as the mother and at the same levels, it was unlikely that this variant was causing France's and Braxton's symptoms, because the mother did not exhibit similar symptoms. [Addendum 02/05/18: Mom says that she did have some of the same symptoms that her children had, but the geneticist might not have realized that mom did have some of the same symptoms. Mom did not have as many symptoms as Lavelle and Braxton had.] Multiple rounds of genetic testing including WES and mitochondrial genome sequencing, were not suggestive of mitochondrial disorder or any other genetic diseases. The family was offered the option of applying for further testing in the [facility]. The family applied for that program, but were not accepted due to the fact that the program wanted patients with different issues.     C). When Deasiah saw Dr Penni Bombard, a geneticist in [place], he confirmed that she does have a mitochondrial disorder and also has Ehlers-Danlos syndrome.     6). Feeling unwell, fatigue, fevers, headaches, nasal and head congestion, lack of appetite: These problems vary from one  part of the day to another. She has at least one of them every day.     A). Fatigue: She does not feel tired when she awakens, but does begin to feel tired about 2 PM. If she naps for an hour or two, the fatigue improves for several hours, but then recurs around 6 PM. If she does not nap, she remains tired and develops a bitemporal headache about 4-5 PM. She occasionally has a problem with insomnia, but not with early awakening. Sleeping in on weekends does not improve her fatigue.      B). Fevers: When she feels bad she usually has fevers. Most of her fevers are in the 98.8-99.2 range. These "fevers" occur about 5-7 PM.     C). Nasal and head congestion: She has had a stuffed up nose for  4-5 months. When she has nasal congestion she also sometimes has head congestion. However, she does not have head congestion without having nasal congestion.      D). Headaches: She often has nuchal headaches when her trapezius muscles are sore/stiff. Sometimes these nuchal headache extend to the vertex. She also has bitemporal headaches when she gets tired.   F. Pertinent family history:   1). Stature and puberty: Mom is 5-1. Dad is 5-5. Mom had menarche at age 52. Mom has had irregular menses prior to her first pregnancy. She was diagnosed with endometriosis after her first pregnancy. After treatment for endometriosis, presumably GnRH analogues for 6 months, her menses were normal.     2). Obesity: Maternal grandmother   3). DM: Maternal grandmother has T2DM. A maternal grand uncle had DM, possibly type 1.    4). Thyroid: 59 y.o. sister has had a few abnormal thyroid hormone levels. Paternal grandmother is hypothyroid, without having had thyroid surgery, irradiation. or been on a prolonged low iodine diet.    5). ASCVD: Mother has a heart murmur.    6). Cancers: paternal grandfather dies of lung CA. There was also leukemia in a distant paternal relative.    7). Others: Older sister has had oligomenorrhea frequently.  Mother has migraines. Younger brother has worse mitochondrial dysfunction and seizures. Maternal grandmother has osteoarthritis and fibromyalgia.  Maternal grandmother takes B12 shots for anemia. Maternal grandmother also has hypertension.  One maternal uncle had cardiomyopathy. Maternal grand uncle had his first CVA at age 68. A distant paternal second cousin and a paternal first cousin had lupus.]  G. Lifestyle:   1). Azriel's diet: She can eat food normally and typically eats 3 small meals per day. She estimates that about 40% of her calories comes from oral intake. She does not eat much at any one time. She is not a "foodie" by nature and is a very picky eater.  She has G-tube feedings every night: 2 cans of Vital Peptide, 1.5 calories per mL, continuous feeding, from 9:30 PM to 7:30 AM via a pump. She does not usually take fluids by the G-tube during the day, but may do so during the Summer months to prevent dehydration.    2). She receives iv fluid infusions by her port-a-cath every other week: D5 1/2 NS, three 1000 ml bags over 12 hours. If she gets sick or gets dehydrated, she gets 3000 ml of D10NS iv over 12 hour. She receives substantial home health nursing support from [facility].     3). Physical activities: She does not walk much for exercise because walking causes her feet to swell. She does not go outside much during the Winter months. She was told by one doctor that she had angioedema.    4). Medications:    A). Morning medications: CoQ10, vitamin E, vitamin C. Prevacid, and allergy medications. Breakfast is usually a fruit smoothy.     B). Bedtime: LT4, Topamax, Lyrica, Seasonale,  2. Clinical course:  A. In May 2020 she had a central venous catheter access device inserted. The catheter is working well.  Laura Parrish saw Dr. Damita Lack on 11/19/18. Her PFTs were essentially normal. The MIP/MEPs were better, but still low. Her sleep study was normal. He put her on Mestinon three times  daily. The Mestinon helped for several hours.   C. She also saw Dr. Artis Flock on 11/19/18. Dr. Artis Flock ordered an EMG to be done at [facility].   DGerarda Parrish has a long  history of developing physical symptoms that she attributes to adverse reactions to medications and then stopping those medications without discussing the issues with the physicians that prescribed the medications. I wonder if some of those reactions were really due to the medications involved. She also has a history of not always following up with specialists that she has seen if what they prescribed she felt was ineffective or might have caused adverse reactions .   E. I tried to start her on fludrocortisone in January 2021, but she stopped it after about 3 weeks because it made her feel bad.   F. On 03/04/19 Dr. Artis Flock had a video visit with Laura Parrish and increased her mestinon to 120 mg, three times daily. Laura Parrish feels that the mestinon is helping "a little bit".   G. On 03/06/19 she saw a GI specialist at [facility]. She was treated with IBGard as needed. She did not feel that the IBGard helped very much and did not contact the specialist for follow up.   H. On 03/24/19 Laura Parrish's G-tube and port-a-cath were replaced.    I. On 05/11/19 she was seen in ortho. A diagnosis of bilateral shoulder subluxations was made, presumably due to Ehlers-Danlos syndrome. She was in PT.   J. On 06/23/19 she had a NCV[facility]G study at [facility]. Family does not know the results.   K. She saw ENT in October 2021. 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 The ENT suggested that she might have TMJ.  L . She saw GYN on 12/27/20 and was diagnosed with endometriosis. She was treated with Junel for three months and the pains improved. After stopping Junel, the pains have recurred.   3. [facility] visit occurred on 06/13/21. I continued her on Synthroid 88 mcg/day and rabeprazole, 20 mg, twice daily. The rabeprazole is helping. However, after reviewing  her lab results from May 2023 that showed a high iron level, I asked that she stop all iron-containing products.   A. In the interim she has been fairly healthy.    B. She continues to have pelvic pains. Dr. Dalbert Garnet at [facility] feels that Laura Parrish has endometriosis. Pre-auth for medical treatment and for surgery is ongoing.  C. Her low BGs are less frequent, but still can occur 30-120 minutes after eating. She snacks frequently during the day. She usually never eats large meals because her stomach feels too full. She no longer takes Boost and similar supplement during the day due to her feeling that the supplements hurt her stomach. She previously increased her G-tube feedings at night to stabilize her BGs during the night. She says that she is now taking in more calories via her G-tube than orally. She has had some nausea in the middle of the night and during the day. Overall the amount of nausea is about the same, or less    C. She still has some back pain, part of which may be due to endometriosis.  The back pain did improve with Junel.  D. She has had some breakthrough bleeding despite taking OCPs regularly.  E. She feels hot at night. She still tends to be cold during the day. F. She is still often tired, "the same".   G. She thinks her headaches are better, in that they are still frequent, but less severe. She now has headaches about every 2 weeks.   H. She has not had much orthostatic dizziness.    I. Her breathing difficulties have improved. Dr. Damita Lack feels that  she may have muscle weakness. He has referred her back to Dr. Artis Flock. Dr Artis Flock thinks Laura Parrish may have myasthenia. She had breathing treatments while she had URIs.   J. She still takes 88 mcg/day of Synthroid. She still has weekly 12-hour iv infusions of D5. She feels better for about a week after the infusions. She has been taking rabeprazole twice a day and feels it is still working well. She is also taking more Skelaxin.   K.  She  continues to have episodic pains in the TMJ area.   L. The eczema around her eyes and forehead is better. She still applies a clobetasol cream given her by dermatology in [place].   4. Pertinent Review of Systems:  Constitutional: Laura Parrish says she feels "decent". She is still tired. She is colder than others all of the time. When she was out in the heat during the Summer, she sometimes sweated. She is also now sweating in her hands, axillae, feet, and groin. She needs to have some wisdom teeth removed.  Eyes: Vision is "not great". She saw a neuroophthalmologist on 06/16/19. The possibility of optic neuropathy, similar to her brother, was raised. Laura Parrish will be followed every two years. Her next follow up will be in January 2024. There are no other significant eye complaints. Neck: The patient has had some complaints of anterior neck soreness and difficulty swallowing recently.  Heart: Her heart rate has occasionally been elevated for a few seconds. She has cut off caffeine. The patient has few complaints of chest pain or chest pressure. Gastrointestinal: As above. She sometimes has nausea in the middle of the night. She no longer has to take Zofran very often. She also sometimes has nausea during the day or during the night. She has not been able to identify a particular trigger. She no longer has epigastric pains. Bowel movents have been more normal. She is still having alternating diarrhea and constipation. The patient has no complaints of excessive hunger. Hands: Hands are dry now, but will be more moist later in the day. Strength is "fine".   Legs: Muscle mass and strength "are the same". She occasionally has pains of her anterior thighs and anterior shins. There are no other complaints of numbness, tingling, burning, or pain. She notes edema about every day. Edema is worse in the evenings.   Feet: Feet are always wet and cold. There are no obvious foot problems. She occasionally has numbing of the  medial feet. There are no other complaints of numbness, tingling, burning, or pain. She often has dorsal edema.  GYN: As above.   5. Dexcom printout: We have data from the last 14 days. She was off sensor for 10 days due to difficulty in obtaining supplies. Average SG was 95, compared with 121 st her last visit. Her SGs during the night are stable, with an occasional SG <70 about 5:30 AM. Her SGs are then stable until about 5-11 PM. If she does not eat enough in snacks during the day, her SGs tend to drop in the evenings. The SGs tend to be lower in the CGM warm up period. Although we do not have much SG data, it appears that when she eats more regularly during the day, her SGs are higher. When she eats less, the SGs are lower.   PAST MEDICAL, FAMILY, AND SOCIAL HISTORY  Past Medical History:  Diagnosis Date   Allergy to alpha-gal    elevated IgE 04/10/16   Autonomic dysfunction  Chiari I malformation (HCC)    Complication of anesthesia    slow to wake up   Dyspnea    Eczema    GERD (gastroesophageal reflux disease)    Headache    Heart murmur    Hypoglycemia    Hypothyroid    IBS (irritable bowel syndrome)    Joint pain    Mitochondrial disease (HCC)    Muscle pain    Pneumonia    several times   PONV (postoperative nausea and vomiting)     Family History  Problem Relation Age of Onset   Asthma Mother    Allergic rhinitis Mother    Heart murmur Mother    Allergic rhinitis Sister    Thyroid disease Sister    Allergic rhinitis Brother    Asthma Brother    Seizures Brother    Mitochondrial disorder Brother    Allergic rhinitis Maternal Grandmother    Asthma Maternal Grandmother    Diabetes type II Maternal Grandmother    Lung cancer Maternal Grandfather    Angioedema Neg Hx    Eczema Neg Hx    Immunodeficiency Neg Hx    Urticaria Neg Hx      Current Outpatient Medications:    ascorbic acid (VITAMIN C) 500 MG tablet, Take 500 mg by mouth daily. , Disp: , Rfl:     azelastine (ASTELIN) 0.1 % nasal spray, Place 2 sprays into both nostrils 2 (two) times daily. Use in each nostril as directed, Disp: 30 mL, Rfl: 5   clobetasol (OLUX) 0.05 % topical foam, Apply topically 2 (two) times daily. Apply to affected areas twice daily as needed taking care to avoid axillae and groin area., Disp: 45 g, Rfl: 3   Coenzyme Q-10 200 MG CAPS, Take 1 capsule by mouth daily., Disp: , Rfl:    Continuous Blood Gluc Sensor (DEXCOM G6 SENSOR) MISC, Inject 1 applicator into the skin as directed. (change sensor every 10 days), Disp: 3 each, Rfl: 11   Continuous Blood Gluc Transmit (DEXCOM G6 TRANSMITTER) MISC, INJECT 1 DEVICE INTO THE SKIN AS DIRECTED (REUSE UP TO 8 TIMES WITH EACH NEW SENSOR), Disp: 1 each, Rfl: 3   leucovorin (WELLCOVORIN) 25 MG tablet, TAKE 1 TABLET BY MOUTH 2 TIMES A DAY, Disp: 180 tablet, Rfl: 1   lidocaine (XYLOCAINE) 5 % ointment, Apply 1 application topically as needed., Disp: 35.44 g, Rfl: 3   metaxalone (SKELAXIN) 800 MG tablet, TAKE 1/2 TABLET (400MG  TOTAL) BY MOUTH DAILY AS NEEDED (MUSCLE PAIN), Disp: 30 tablet, Rfl: 3   methylphenidate (RITALIN) 10 MG tablet, 1 tablet by mouth daily.  May increase to 2 daily if needed., Disp: 30 tablet, Rfl: 0   montelukast (SINGULAIR) 10 MG tablet, TAKE 1 TABLET BY MOUTH AT BEDTIME, Disp: 30 tablet, Rfl: 3   Nutritional Supplements (NUTRITIONAL SUPPLEMENT PLUS) LIQD, 3 cartons (711 mL) of Ensure Clear given via gtube daily., Disp: 22041 mL, Rfl: 12   Nutritional Supplements (NUTRITIONAL SUPPLEMENT PLUS) LIQD, 237-474 mL (1-2 cartons) Boost Breeze (peach flavored only) given po daily., Disp: 14694 mL, Rfl: 12   pregabalin (LYRICA) 25 MG capsule, Take 1 capsule (25 mg total) by mouth 2 (two) times daily., Disp: 60 capsule, Rfl: 5   pregabalin (LYRICA) 75 MG capsule, Take 150 mg by mouth at bedtime., Disp: , Rfl:    promethazine (PHENERGAN) 25 MG tablet, Take 1 tablet at onset of migraine. May repeat every 6 hours PRN, Disp: 30  tablet, Rfl: 0   pyridostigmine (MESTINON)  60 MG tablet, Take 1 tablet (60 mg total) by mouth in the morning, at noon, in the evening, and at bedtime., Disp: 120 tablet, Rfl: 5   RABEprazole (ACIPHEX) 20 MG tablet, TAKE 1 TABLET BY MOUTH 2 TIMES A DAY, Disp: 60 tablet, Rfl: 6   rizatriptan (MAXALT) 10 MG tablet, TAKE 1 TABLET BY MOUTH AS NEEDED FOR MIGRAINE. MAY REPEAT IN 2 HOURS IF NEEDED., Disp: 12 tablet, Rfl: 3   SLYND 4 MG TABS, Take 1 tablet by mouth daily., Disp: , Rfl:    SYNTHROID 88 MCG tablet, TAKE 1 TABLET BY MOUTH EVERY MORNING ON AN EMPTY STOMACH, Disp: 90 tablet, Rfl: 1   topiramate (TOPAMAX) 25 MG tablet, Take 1 tablet (25 mg total) by mouth at bedtime., Disp: 30 tablet, Rfl: 5   Topiramate ER (TROKENDI XR) 50 MG CP24, TAKE 1 CAPSULE BY MOUTH DAILY, Disp: 30 capsule, Rfl: 5   traZODone (DESYREL) 50 MG tablet, TAKE 1/2 TABLET BY MOUTH 30 MINUTES PRIOR TO BEDTIME. IF NEEDED, MAY INCREASE TO 1 TABLET BEFORE BEDTIME, Disp: 30 tablet, Rfl: 1   vitamin E 200 UNIT capsule, Take 200 Units by mouth daily., Disp: , Rfl:    albuterol (ACCUNEB) 0.63 MG/3ML nebulizer solution, Take 3ml by nebulizer 3 times per day as needed for shortness of breath (Patient not taking: Reported on 09/18/2021), Disp: 270 mL, Rfl: 5   albuterol (PROVENTIL HFA) 108 (90 Base) MCG/ACT inhaler, Inhale 1-2 puffs into the lungs every 4 (four) hours as needed for wheezing or shortness of breath. (Patient not taking: Reported on 07/25/2021), Disp: 18 g, Rfl: 0   Continuous Blood Gluc Receiver (DEXCOM G6 RECEIVER) DEVI, 1 Device by Does not apply route as directed. (Patient not taking: Reported on 07/25/2021), Disp: 1 each, Rfl: 2   EPINEPHrine 0.3 mg/0.3 mL IJ SOAJ injection, Inject 0.3 mg into the muscle once. As needed (Patient not taking: Reported on 07/25/2021), Disp: , Rfl:    Glucagon (BAQSIMI TWO PACK) 3 MG/DOSE POWD, Place 1 spray into the nose as directed. (Patient not taking: Reported on 12/27/2020), Disp: 2 each, Rfl:  3   HAILEY FE 1/20 1-20 MG-MCG tablet, Take 1 tablet by mouth daily. (Patient not taking: Reported on 09/18/2021), Disp: , Rfl:    levOCARNitine (CARNITOR) 1 GM/10ML solution, TAKE 2 TEASPOONFUL ( ) BY MOUTH TWICEDAILY (Patient not taking: Reported on 09/18/2021), Disp: 118 mL, Rfl: 12   norethindrone-ethinyl estradiol (LOESTRIN) 1-20 MG-MCG tablet, Take by mouth. (Patient not taking: Reported on 01/31/2021), Disp: , Rfl:    ondansetron (ZOFRAN-ODT) 4 MG disintegrating tablet, Take 1 tablet under the tongue every 6-8 hours as needed for nausea. (Patient not taking: Reported on 07/25/2021), Disp: 30 tablet, Rfl: 5   polyethylene glycol powder (GLYCOLAX/MIRALAX) 17 GM/SCOOP powder, 17 g or one capful in G-Tube. (Patient not taking: Reported on 09/18/2021), Disp: 255 g, Rfl: 3   predniSONE (DELTASONE) 20 MG tablet, Take 2 tablets daily with breakfast. (Patient not taking: Reported on 07/25/2021), Disp: 10 tablet, Rfl: 0  Allergies as of 09/18/2021 - Review Complete 09/18/2021  Allergen Reaction Noted   Other Anaphylaxis 03/29/2013   Latex Rash 04/08/2012   Lactated ringers  05/28/2018   Propofol Other (See Comments) 05/17/2012   Tape Itching and Other (See Comments) 05/17/2012     reports that she has never smoked. She has never been exposed to tobacco smoke. She has never used smokeless tobacco. She reports that she does not drink alcohol and does not use drugs. Pediatric History  Patient Parents   Coiner,Leesa (Mother)   Hilbert Corrigan (Father)   Other Topics Concern   Not on file  Social History Narrative   Lavelle graduated from [employer]. Graduated from [employer].  currently taking break then returning for masters at another school.    She enjoys reading, playing with her cat and her dog, and hanging out with her sister.     1. School and Family: She graduated from [employer] in May 2023. She lives at home. She is taking a break for a year, and will then return  to academia. Mom says that Shericka is getting bored at home.  2. Activities: Sedentary 3. Primary Care Provider: Rudene Christians, DO  4. Peds Neurology: Dr. [name], MD 5. Allergy: Dr. Malachi Bonds, [facility] [place] 6. GI: No one at present 7. Peds Endocrinology: Dr. Fransico Adriona Kaney 8. Peds Pulmonary: Dr. Damita Lack 9. Genetics: [facility] in the past 10. GYN: Dr. Christeen Douglas, MD  REVIEW OF SYSTEMS: There are no other significant problems involving Laura Parrish's other body systems.    Objective:  Objective  Vital Signs:  BP 110/60   Pulse 64   Wt 129 lb 6.4 oz (58.7 kg)   BMI 22.21 kg/m    Ht Readings from Last 3 Encounters:  07/25/21 5\' 4"  (1.626 m)  07/25/21 5\' 4"  (1.626 m)  07/19/21 5\' 4"  (1.626 m)   Wt Readings from Last 3 Encounters:  09/18/21 129 lb 6.4 oz (58.7 kg)  07/25/21 133 lb 14.4 oz (60.7 kg)  07/25/21 124 lb 1.9 oz (56.3 kg)   HC Readings from Last 3 Encounters:  06/22/20 20.87" (53 cm)   Body surface area is 1.63 meters squared. Facility age limit for growth %iles is 20 years. Facility age limit for growth %iles is 20 years.   PHYSICAL EXAM:  Constitutional: Laura Parrish looks somewhat tired, but better. She has lost 2 pounds since her last visit. She is awake and alert, but fairly passive. She answers questions with 1-3 word answers, but does not volunteer much information. Her affect is rather flat. Her insight is good.  Eyes: There is no arcus or proptosis. The conjunctivae are dry.  Mouth: The oropharynx appears normal. The tongue appears normal. The oral mucosa is dry. There is no obvious gingivitis. Thre is no oral hyperpigmentation.  Neck: There are no bruits present. The thyroid gland appears mildly enlarged.  The thyroid gland is again enlarged, but smaller, at about 21+grams in size. Today the right lobe is more enlarged and the left lobe is a bit less enlarged. The consistency of the left lobe of the gland is full, but the right lobe is  normal.  She has some discomfort to palpation in the right mid-pole today. .  Lungs: The lungs are clear. Air movement is good. Heart: The heart rhythm and rate appear normal. Heart sounds S1 and S2 are normal. I do not appreciate any pathologic heart murmurs. Abdomen: The abdomen is normal in size. Bowel sounds are normal. The G-tube site looks good today. There is no obviously palpable hepatomegaly, splenomegaly, or other masses. There was no tenderness to palpation.  Arms: Muscle mass appears appropriate for age.  Hands: There is no obvious tremor. Phalangeal and metacarpophalangeal joints appear normal. Palms are normal. Nails are not pallid.  Legs: Muscle mass appears appropriate for age. There is no edema.  Neurologic: Muscle strength is 4-5/5 in the UEs and 4-5/5 in th LEs. Muscle tone appears normal. Sensation  to touch is normal in the legs.   LAB DATA:   Labs 09/11/21: TSH 2.11, free T4 1.2, free T3 3.0; CBC normal, except RBC 5.16 (ref 3.8-5.1), iron 107 (ref 40-190)  Labs 06/03/21: TSH 0.60, free T4 1.3, free T3 3.1; CBC normal; iron 192 (ref 40-190)  Labs 12/18/20: TSH 0.60, free T4 1.5, free T3 3.3; CMP normal, except chloride 117 (ref 98-110) and CO2 12 (ref 29-32); CBC normal, except RBC 5.18 (ref 3.80-5.10), HCT 46.7 (ref 35-45), eosinophils 611 (ref 15-500); C-peptide 1.98 (ref 0.80-3.85); iron 146 (ref 40-190)  Labs 08/27/20: CBG 105  Labs 08/15/20: TSH 1.14, free T4 1.3, free T3 2.8; CMP normal, except CO2 19 (ref 20-32); CBC normal, except RC 5.12 (ref 3.80-5.10); iron 102 (ref 40-190); prolactin 7.1 (ref 3-30)  Labs 04/20/20: TSH 1.14, free T4 1.4, free T3 2.9; CBC normal, except platelets 428 (ref 140-400);   Labs 11/17/19: TSH 1.21, free T4 1.1, free T3 2.9 (ref 3.0-4.7); CBC normal; iron 69; LH <0.2, FSH 0.7,   Labs 07/25/19: HbA1c 4.8%,  TSH 1.67, free T4 1.3, free T3 3.0; CBC normal, except RBC 5.22 (ref 3.8-5.1) and Hct 45.5% (ref 35-45%); iron 79 (ref 40-190)  Labs  03/24/19: Serum sodium 141, potassium 5.2, chloride 19, CO2 18, glucose 81  Labs 02/10/19: HbA1c 5.0%; TSH 0.73, free T4 1.3, free T3 3.0; CMP normal  Labs 11/12/18: TSH 1.12, free T4 1.38, T3 156 (ref 71-180); prolactin 11.3 (ref 4.8-23.3)  Labs 09/16/18: Androstenedione 55 (ref 51-230), DHEAS 67 (ref 51-321)  Labs 08/30/18 at 8:43 AM: ACTH 13 (ref 6-50), cortisol 26.2 (ref 4-22), aldosterone 4 (ref < 28), plasma renin activity 2.4 (ref 0.25-5.82),   Labs 08/02/18: TSH 2.12, free T4 1.3, free T3 3.0; CMP normal; aldosterone <1 (reg 3-28), plasma renin activity (PRA) 0.92 (ref 0.25-5.82); prolactin 9.9 (ref 3-30)  Labs 02/01/18 at 8 AM: TSH 4.16 (ref 0.50-4.30, but many endocrinologists consider 3.4 to be the true physiologic upper limit of normal), free T4 1.4 (ref 0.8-1.4), free T3 2.7 (ref 3.0-4.7), TPO antibody 241 (ref <9), thyroglobulin antibody 1 (ref < or = 1); CMP normal except CO2 19; cholesterol 123, triglycerides 114, HDL 37, LDL 66; Prolactin 62.9 (ref 3.3-20); IGFBP-3 7.8 (ref 3.2-7.9); IGF-1 249 (ref 108-548, but very appropriate for a young woman whose height has plateaued); iron 177 (ref 27-164); CBC normal; androstenedione 46 (ref 51-230), DHEAS 74 (ref 51-321); ACTH 14, cortisol 28.9  Labs 11/25/17: TSH 1.58; CBC normal except 736 eosinophils (ref 15-500)  Labs 06/24/17: TSH 2.418, free T4 0.98  Labs 04/21/17: TSH 1.30  Labs 03/10/17: TSH 7.87, free T4 1.2; CBC normal; prolactin 7.1  Labs 06/24/16: TSH 1.960, free T4 1.12  Labs 04/10/16: TSH 2.28, TPO antibody 165 (ref <9), thyroglobulin antibody 2 (ref <2)  Labs 05/29/15: TSH 1.640, free T4 1.09  Labs 11/28/14: TSH 1.87, free T4 0.80  Labs 05/31/2014: TSH 1.680, free T4 1.23  Labs 04/19/14: TSH 1.19  Labs 09/30/13: TSH 2.53, free T4 1.14  Labs 02/04/13: TSH 0.88, T4 11.3  Labs 10/18/12: TSH 10.35 (ref 0.5-4.5), free T4 0.67 (ref 0.8-2.0)  IMAGING  MRI C-spine 12/18/20: Small syrinx in the lower cervical cord. 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 Normal thoraco-lumber spine.   Thyroid ultrasound 12/01/19: Small, heterogenous thyroid. 3 mm hypoechoic nodule left inferior lobe. No indication for biopsy or dedicated imaging follow up.  Assessment and Plan:  Assessment  ASSESSMENT:  Jaonna is a 21 y.o. Caucasian young woman with multiple medical problems as noted below. At this visit her Hashimoto's thyroiditis is clinically active. She is chemically euthyroid. She is having less fatigue. Her extreme nausea and stomach discomfort had improved while taking rabeprazole once daily, but she still has nausea fairly frequently. Her unintentional weight loss has recurred. She is having less dizziness and more sweating, possibly associated with improvement in her dysautonomia. Her hypoglycemic symptoms persist, especially later in the day. Overall she appears to be doing better.   1-3. Hypothyroid, acquired/goiter/thyroiditis:   A. The occurrence of autoimmune thyroid disease with positive anti-thyroid antibodies is very common. We have adjusted her Synthroid dose to try to keep her TSH in the goal range of 1.0-2.0.   B. At her visit in January 2020 her TSH was >4.0 and her TPO antibody level was higher. We increased her Synthroid dose to 88 mcg/day.   C. From January to August 2020 her goiter remained the same in overall size, but the lobes had shifted in size. The process of waxing and waning of thyroid gland size was c/w evolving Hashimoto's thyroiditis.   D. At her August 2020 visit her TFTs were at about the 30% of the physiologic range. I would usually have  increased the Synthroid dose to 100 mcg/day, but decided to await the results of her Hypothalamic-Pituitary-Adrenal (HPA) axis testing. Fortunately, those results were normal.   E. Since her August 2020 visit she has had  intermittent flare ups of thyroiditis.   E. Her TFTs drawn on 11/12/18 were mid-euthyroid on her current Synthroid dose of 88 mcg/day. Her TFTs on 02/10/19 were at about the 75% of the physiologic range. Her TFTs in July 2021 were mid-euthyroid. Her TFTs in November were mid-euthyroid according to her TSH and free T4, but slightly low according to her free T3. These changes were c/w a flare up of thyroiditis, which she was having. She looked mildly hypothyroid clinically at that time.   F. Her thyroid US in November 2021 was read as normal by the radiologist. By our endocrine standards the left and right lobes were mildly enlarged.   G. Her goiter is still asymmetric today. The gland is mildly tender to palpation today. The episodic tenderness of the gland and the shifting of size in the lobes from visit to visit is c/w evolving Hashimoto's thyroiditis.  H. Her TFTs in April 2022 and August 2022 were mid-normal. Her TFTs in December 2022 were at about th 90% of the physiologic range. Her TFTs in May and August 2023 were still mid-euthyroid. We will continue the 88 mcg dose for now, but TFTs should be repeated about every 6 months for the foreseeable future. 4. Dizziness/hypotension:   A. She no longer has vertigo, but still occasionally has elements of orthostatic dizziness c/w POTS /dysautonomia and intermittent dehydration. Fortunately, these symptoms have been less frequent and less severe.   B. Chewing gum and taking meclizine can help the vertigo.  C. Remaining well hydrated can help with the orthostasis.    D. Since both her aldosterone and renin were "lowish" in October. It was reasonable to conduct a therapeutic trial of fludrocortisone. 0.1 mg/day. Unfortunately, she felt that the medication did not help her, so stopped it.  5-7. Weakness/fatigue/cold sensation:    A. The cause(s) of these problems is/are unclear. 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  B.  Her ACTH and adrenal hormone tests were normal in January 2020 and again in August and September 2020. Her aldosterone was very low in July 2020 but normal in August 2020.   C. As noted above, she was euthyroid in July and November 2021.   D. She could have fibromyalgia, chronic fatigue, or some other "autoimmune" problem. Perhaps she does have mitochondrial dysfunction or myasthenia.   E. Giving the infusions of D5W every week has made Laura Parrish feel better.   F. Hot showers cause her to feel weak and dizzy. This problem is c/w orthostatic hypotension and with dysautonomia.  G. Interestingly, her fatigue is probably about the same. She also still has  mildly elevated temperature sensations in the evening, but not the fevers that she had several years ago.  8. Hypoglycemia:   A. The cause(s) of this problem is/are also unclear.  1). Her hypothalamic-pituitary-hepatic axis for [facility] and her hypothalamic-pituitary-adrenal axis seemed to be normal previously. She could have celiac disease, but her tests in December 2019 were negative. She could have a problem with glycogen storage, glycogenolysis, and/or gluconeogenesis.  2). The fact that she often does not eat much, and the fact that she appears to have a very active gastro-colic reflex may interact to impair glucose intake, glucose absorption, and glycogen synthesis. The fact that she has decreased her oral supplements during the day seems to have aggravated the process.  3). The fact that she increased her tube feedings at night may be stimulating more insulin production that causes lower BGs later in the day when she does not eat or drink much.   B.. Laura Parrish had a consultation visit with Dr. Ladona Ridgel, our PharmD clinical pharmacist and diabetes educator. Dr. Ladona Ridgel ordered a Dexcom for Laura Parrish. Laura Parrish says that her Dexcom generally runs about 16 points higher than her CBGs.  9. Abnormal menses:   A. The cause(s) of this problem is/are also unclear. Mother gives a  good history for her own endometriosis that resolved after treatment with GnRH agonists. Laura Parrish may also have endometriosis.   BGerarda Parrish has recently had more excessive bleeding.   C. Her menses are now c/w her  OCPs.  10. Alternating constipation and diarrhea: She has had a very active gastro-colic reflex. Her bowel movements still alternate between diarrhea and constipation, but perhaps less so. She was seen by GI in February 2021, but has not returned to clinic. She is doing fairly well now.  11. Hyperprolactinemia:   A. It is highly likely that her elevated prolactin In January 2020 was due to her hypothyroidism.   B. Her prolactin level in July 2020 was well within normal, paralleling her improvement in thyroid hormone status. Her prolactin level in October 2020 was also quite normal.   Her prolactin level in August 2022 was at about the 25% of the reference range. 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 She may have dysautonomia.  13. Dehydration: She is not as consistently trying to drink 32 ounces of fluid per day. This problem could be due to an inadequate renin-angiotensin-aldosterone system, or to chronic diarrhea, or to other causes. She is relatively dehydrated today.  14. Bradycardia:   A. She is not on a beta-blocker or combined alpha-beta blocker. The fact that mom also has bradycardia suggests a dysautonomia. Hypothyroidism may also have been a causal factor in the past.  B. Recently, most of her HRs have been in the 60s-70s. HR today was 80. 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 The aldosterone level and the renin level in August 2020 were normal. Her renal function and her adrenal functions were essentially normal in March 2021 and in August 2022. As noted above, we tried to  conduct a therapeutic trial of fludrocortisone.  16. Breathing difficulty:  Dr. Damita Lack was following this problem, but referred  her back to Dr. Artis Flock.   17. Pallid nail beds. Her CBC in July and November 2021 and in April 2022 and August 2022 were essentially normal. Her iron levels in July and November 2021 and in August 2022 were normal. She does not have pallor today. 18. Nausea/heartburn: She is doing better with rabeprazole twice daily.   19. Weight loss, unintentional:  A. Nausea sometimes adversely affects her appetite. If she eats too much, she develops stomach pains.  These symptoms are c/w hyperacidity. Fortunately, her nausea has significantly improved.  B. She has been losing weight more recently.   PLAN:  1. Diagnostic: I reviewed lab results.  2. Therapeutic: Continue Synthroid 88 mcg/day for now.  Take rabeprazole, 20 mg, twice daily. 3. Patient education: We discussed all of the above at great length. I recommend referral to Dr. Corwin Levins in [facility].  4. Follow-up: No here  Level of Service: This visit lasted in excess of 50 minutes. More than 50% of the visit was devoted to counseling.   David Stall, MD, CDE Pediatric and Adult Endocrinology

## 2021-09-18 ENCOUNTER — Ambulatory Visit (INDEPENDENT_AMBULATORY_CARE_PROVIDER_SITE_OTHER): Payer: PRIVATE HEALTH INSURANCE | Admitting: "Endocrinology

## 2021-09-18 ENCOUNTER — Encounter (INDEPENDENT_AMBULATORY_CARE_PROVIDER_SITE_OTHER): Payer: Self-pay | Admitting: "Endocrinology

## 2021-09-18 VITALS — BP 110/60 | HR 64 | Wt 129.4 lb

## 2021-09-18 DIAGNOSIS — E063 Autoimmune thyroiditis: Secondary | ICD-10-CM | POA: Diagnosis not present

## 2021-09-18 DIAGNOSIS — E039 Hypothyroidism, unspecified: Secondary | ICD-10-CM

## 2021-09-18 DIAGNOSIS — R001 Bradycardia, unspecified: Secondary | ICD-10-CM

## 2021-09-18 DIAGNOSIS — I951 Orthostatic hypotension: Secondary | ICD-10-CM

## 2021-09-18 DIAGNOSIS — N938 Other specified abnormal uterine and vaginal bleeding: Secondary | ICD-10-CM

## 2021-09-18 DIAGNOSIS — E162 Hypoglycemia, unspecified: Secondary | ICD-10-CM

## 2021-09-18 DIAGNOSIS — E049 Nontoxic goiter, unspecified: Secondary | ICD-10-CM | POA: Diagnosis not present

## 2021-09-18 NOTE — Patient Instructions (Signed)
No further follow up here.  At [facility], we are committed to providing exceptional care. You will receive a patient satisfaction survey through text or email regarding your visit today. Your opinion is important to me. Comments are appreciated.  

## 2021-09-19 ENCOUNTER — Other Ambulatory Visit (INDEPENDENT_AMBULATORY_CARE_PROVIDER_SITE_OTHER): Payer: Self-pay | Admitting: Pediatrics

## 2021-10-04 ENCOUNTER — Other Ambulatory Visit (INDEPENDENT_AMBULATORY_CARE_PROVIDER_SITE_OTHER): Payer: Self-pay | Admitting: "Endocrinology

## 2021-10-04 DIAGNOSIS — E162 Hypoglycemia, unspecified: Secondary | ICD-10-CM

## 2021-10-15 NOTE — Progress Notes (Signed)
Medical Nutrition Therapy - Progress Note Appt start time: 10:33 AM  Appt end time: 10:53 AM Reason for referral: Gtube dependence Referring provider: Dr. Rogers Blocker - [facility] Pertinent medical hx: Chiari I malformation, syringomyelia, mitochondrial disease, iron-deficiency anemia, hypoglycemia, goiter, chronic diarrhea, chronic dehydration, alpha-gal hypersensitivity, +G-tube, +port (receives weekly infusions)  Assessment: Food allergies: red meat (can consume poultry), lactose intolerance Pertinent Medications: see medication list Vitamins/Supplements: Co-Q10, Vitamin C (500 mg), vitamin E (200 IU), Calcium (50 mg)  Pertinent labs:  (8/30) TSH - 2.11 (WNL), Iron - 107 (WNL), Thyroid Panel: WNL (8/30) CBC: RBC - 5.16 (high) (7/7) CMP: WNL (5/22) Thyroid Panel, CBC - WNL  (5/22) Iron: 198 (high)  (10/12) Anthropometrics: Ht: 162 cm  Wt: 56.8 kg  BMI: 21.5 kg/m^2  IBW based on hamwi equation @ 56.8 kg (+/-10%)  (7/13) Anthropometrics: Ht: 162 cm  Wt: 56.4 kg  BMI: 21.3 kg/m^2  IBW based on hamwi equation @ 56.8 kg (+/-10%)  09/18/21 Wt: 58.6 kg 07/25/21 Wt: 56.4 kg 06/13/21 Wt: 59.5 kg 01/31/21 Wt: 56.7 kg  Estimated minimum caloric needs: 1800-2000 kcal/day (EER)  Estimated minimum protein needs: 0.8 g/kg/day (DRI) Estimated minimum fluid needs: 1800-2000 mL/day (1 mL/kcal)   Primary concerns today: Follow-up for Gtube dependence.  Mom accompanied pt to appt today.   Dietary Intake Hx: Usual eating pattern includes: 3 meals and 3-4 snacks per day.  How long does it usually take to finish a meal: <30 minutes  Texture modifications: chopped foods  Chewing or swallowing difficulties with foods and/or liquids: coughing only with foods, occasionally coughing with liquids   Usual feeding regimen: 711 mL (2-3 cans) Ensure clear @ 85 mL/hr for 8.5 hours (9:30 PM - 6 AM); 2-3 bags of D5 infusions weekly @ 200 mL/hr x 12-14 hours   24-hr recall:  Breakfast: smoothie (pb, banana,  oatmeal, 2%  fairlife lactose free) OR 2 packet oatmeal w/ water + coffee or tea  Lunch: 2 chicken tenders + fruit or chips OR small amount chicken salad w/ crackers + fruit OR pb and J Dinner: whatever family is having (starch, protein, vegetable)   Typical Snacks: mini muffins, fruit, activia, kind bars  Typical Beverages: water (~64 oz), juice (1-2 bottles depending on blood sugar), pedialyte (1-2 cartons)  Supplements: 1 boost breeze   Notes: Lashawnda notes that she found out that her pains were not caused by constipation, rather by muscle spasms. She has been receiving trigger point injections which have helped tremendously. Kamarie has also been drinking probiotic beverages which she feels has improved her symptoms. Buffi has an appointment with GI specialists in December. Mom and Aissata note concern for high blood sugars in the middle of the night which they feel may be related to ensure clear supplementation.  GI: daily (liquid), continued mix of constipation/diarrhea - followed by GI   Physical Activity: normal ADL and walking dog, swimming at the pool, excited to start yoga   Estimated intake likely meeting needs given adequate growth.  Pt consuming various food groups.  Pt consuming adequate amounts of each food group.   Nutrition Diagnosis: (1/19) Inadequate oral intake related to mitochondrial disorder causing decreased appetite and poor PO feeding as evidence by G-tube dependent to meet caloric, protein, and fluid needs.  Intervention: Discussed pt's growth and current regimen. RD discussed in detail incorporating more protein in Hanley's overall diet as well as incorporating more protein into nighttime supplements to see if this aids in more controlled blood sugars. Discussed recommendations  below. All questions answered, family in agreement with plan.   Nutrition Recommendations: - Let's start adding in 1 scoop of beneprotein with each ensure clear carton at night to see this helps  blood sugar control. Adjust as needed. I will put in an order with [employer].  - Incoproate some type of protein each time we eat (yogurt, cheese, nuts, seeds, ground flax seeds, chicken, fish, tofu, beans, tempeh, yogurt).   Teach back method used.  Monitoring/Evaluation: Continue to Monitor: - Growth trends - PO intake  - TF tolerance  Follow-up scheduled for 01/23/22 joint with Dr. Artis Flock.  Total time spent in counseling: 20 minutes.

## 2021-10-17 NOTE — Progress Notes (Signed)
Patient: Laura Parrish MRN: [id number] Sex: female DOB: Dec 13, 2000  Provider: [name], MD Location of Care: Pediatric Specialist- Pediatric Complex Care Note type: Routine return visit  History of Present Illness: Referral Source: Laura Likens, MD History from: patient and prior records Chief Complaint: Complex Care  Laura Parrish is a 21 y.o. 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 Patient was last seen 07/25/21 where I provided samples of Rimegpant for migraine, started methylphenidate, and recommended decreased fluids.    Patient presents today with mother They report their largest concern is continuing to manage headaches when they arise.  Symptom management:  They report that she tried the Nurtec with a headaches and this was very effective. Having HA 1/week, tries ibuprofen first which can sometimes help but if HA continues will take Nurtec which stops it completely. As a last resort she would take phenergan but has not needed it at all.   She reports pain in legs is the same but managable. Dizziness is not a problem right now.   Sleeping well, not needing Trazodone to sleep right now.   Reports that when she does take the Ritalin it does help with her energy but does make her very hyper.   Care coordination (other providers): She saw Dr. Sheliah Hatch at [facility] on 07/25/21, 08/16/21, 09/18/21 for treatment of granulation tissue around her g-tube.  She saw Dr. Dalbert Garnet for OBGYN at [facility] to address abdominal pain. Who recommended management with NSAIDs prn, Muscle relaxer  prn, Trigger point injections to the rectus muscle. She also discussed option for Depo or Lupron injections, IUD, orlissa and diagnostic laparoscopy for further diagnostic testing if none of the above helps her pain. She sent Laura Parrish for this pain and possible endometriosis and Slynd for contraception. Can trial micronor if slynd not covered. Advised to return for trigger point injections which she received on 08/15/21 and 09/12/21.  She saw Dr. Fransico Michael for Endo where he continued synthroid and rabeprozole as well as refer to adult endo, Dr. Corwin Levins. They are not sure if they have a referral for this however.   We have been working to get her a dentist since the last visit and recommended mom reach out to her mitochondrial doctor in [place] for official guidance on dental sedation, then call around to local dentists to see who may be willing to take her.   Care management needs:  Graduated undergrad in May, is taking a year off, home schooling her brother.   Equipment needs:  Have received her chair, but they aren't needing it as much.   Past Medical History Past Medical History:  Diagnosis Date   Allergy to alpha-gal    elevated IgE 04/10/16   Autonomic dysfunction    Chiari I malformation (HCC)    Complication of anesthesia    slow to wake up   Dyspnea    Eczema    GERD (gastroesophageal reflux disease)    Headache    Heart murmur    Hypoglycemia    Hypothyroid    IBS (irritable bowel syndrome)    Joint pain    Mitochondrial disease (HCC)    Muscle pain  Pneumonia    several times   PONV (postoperative nausea and vomiting)     Surgical History Past Surgical History:  Procedure Laterality Date   ADENOIDECTOMY     2004or 2005   chiari decompression  2007   GALLBLADDER SURGERY  2015   GASTROSTOMY TUBE PLACEMENT     x 2   IR CV LINE INJECTION  07/06/2020   MUSCLE BIOPSY  2010   Port Replacement  05/2018   x 2 orignal 08/2016 then replacement 05/2018   PORT-A-CATH  REMOVAL N/A 03/24/2019   Procedure: PORT-A-CATH REMOVAL;  Surgeon: Rodman Pickle, MD;  Location: [facility] OR;  Service: General;  Laterality: N/A;   PORTACATH PLACEMENT Right 03/24/2019   Procedure: PORT-A-CATH INSERTION under ultrasound and fluoroscopic guidance;  Surgeon: Rodman Pickle, MD;  [facility] OR;  Service: General;  Laterality: Right;   REMOVAL OF GASTROSTOMY TUBE N/A 03/24/2019   Procedure: Exchange of Gastrostomy Tube;  Surgeon: Rodman Pickle, MD;  Location: [facility] OR;  Service: General;  Laterality: N/A;   tethered cord  07/2006   TONSILLECTOMY     [id number]    Family History family history includes Allergic rhinitis in her brother, maternal grandmother, mother, and sister; Asthma in her brother, maternal grandmother, and mother; Diabetes type II in her maternal grandmother; Heart murmur in her mother; Lung cancer in her maternal grandfather; Mitochondrial disorder in her brother; Seizures in her brother; Thyroid disease in her sister.   Social History Social History   Social History Narrative   Bibiana graduated from [employer].    Graduated from [employer]. Currently taking break then returning for masters at another school.    She enjoys reading, playing with her cat and her dog, and hanging out with her sister.     Allergies Allergies  Allergen Reactions   Other Anaphylaxis    Alpha Gal allergy \ Red meat\ any hoof animal   Latex Rash   Lactated Ringers     Contraindicated w/ mitochondrial syndrome   Propofol Other (See Comments)    Due to possible mitochondrial disorder Due to possible mitochondrial disorder Due to possible mitochondrial disorder   Tape Itching and Other (See Comments)    Medications Current Outpatient Medications on File Prior to Visit  Medication Sig Dispense Refill   albuterol (ACCUNEB) 0.63 MG/3ML nebulizer solution Take 41ml by nebulizer 3 times per day as needed for shortness of breath 270 mL 5    albuterol (PROVENTIL HFA) 108 (90 Base) MCG/ACT inhaler Inhale 1-2 puffs into the lungs every 4 (four) hours as needed for wheezing or shortness of breath. 18 g 0   ascorbic acid (VITAMIN C) 500 MG tablet Take 500 mg by mouth daily.      azelastine (ASTELIN) 0.1 % nasal spray Place 2 sprays into both nostrils 2 (two) times daily. Use in each nostril as directed 30 mL 5   cetirizine (ZYRTEC) 10 MG tablet Take 10 mg by mouth daily.     clobetasol (OLUX) 0.05 % topical foam Apply topically 2 (two) times daily. Apply to affected areas twice daily as needed taking care to avoid axillae and groin area. 45 g 3   Coenzyme Q-10 200 MG CAPS Take 1 capsule by mouth daily.     Continuous Blood Gluc Receiver (DEXCOM G6 RECEIVER) DEVI 1 Device by Does not apply route as directed. 1 each 2   Continuous Blood Gluc Sensor (DEXCOM G6 SENSOR) MISC APPLY 1 SENSOR INTO THE SKIN AS DIRECTED(CHANGE  SENSOR EVERY 10 DAYS) 3 each 11   Continuous Blood Gluc Transmit (DEXCOM G6 TRANSMITTER) MISC INJECT 1 DEVICE INTO THE SKIN AS DIRECTED (REUSE UP TO 8 TIMES WITH EACH NEW SENSOR) 1 each 3   leucovorin (WELLCOVORIN) 25 MG tablet TAKE 1 TABLET BY MOUTH 2 TIMES A DAY 180 tablet 1   levOCARNitine (CARNITOR) 1 GM/10ML solution TAKE 2 TEASPOONFUL ( ) BY MOUTH TWICEDAILY 118 mL 12   lidocaine (XYLOCAINE) 5 % ointment Apply 1 application topically as needed. 35.44 g 3   metaxalone (SKELAXIN) 800 MG tablet TAKE 1/2 TABLET (400MG  TOTAL) BY MOUTH DAILY AS NEEDED (MUSCLE PAIN) 30 tablet 3   montelukast (SINGULAIR) 10 MG tablet TAKE 1 TABLET BY MOUTH AT BEDTIME 30 tablet 3   norethindrone-ethinyl estradiol (LOESTRIN) 1-20 MG-MCG tablet Take by mouth.     Nutritional Supplements (NUTRITIONAL SUPPLEMENT PLUS) LIQD 3 cartons (711 mL) of Ensure Clear given via gtube daily. mL 12   Nutritional Supplements (NUTRITIONAL SUPPLEMENT PLUS) LIQD 237-474 mL (1-2 cartons) Boost Breeze (peach flavored only) given po daily. 14694 mL 12    ondansetron (ZOFRAN-ODT) 4 MG disintegrating tablet Take 1 tablet under the tongue every 6-8 hours as needed for nausea. 30 tablet 5   promethazine (PHENERGAN) 25 MG tablet Take 1 tablet at onset of migraine. May repeat every 6 hours PRN 30 tablet 0   pyridostigmine (MESTINON) 60 MG tablet Take 1 tablet (60 mg total) by mouth in the morning, at noon, in the evening, and at bedtime. 120 tablet 5   RABEprazole (ACIPHEX) 20 MG tablet TAKE 1 TABLET BY MOUTH 2 TIMES A DAY 60 tablet 6   rizatriptan (MAXALT) 10 MG tablet TAKE 1 TABLET BY MOUTH AS NEEDED FOR MIGRAINE. MAY REPEAT IN 2 HOURS IF NEEDED. 12 tablet 3   SYNTHROID 88 MCG tablet TAKE 1 TABLET BY MOUTH EVERY MORNING ON AN EMPTY STOMACH 90 tablet 1   traZODone (DESYREL) 50 MG tablet TAKE 1/2 TABLET BY MOUTH 30 MINUTES PRIOR TO BEDTIME. IF NEEDED, MAY INCREASE TO 1 TABLET BEFORE BEDTIME 30 tablet 1   vitamin E 200 UNIT capsule Take 200 Units by mouth daily.     EPINEPHrine 0.3 mg/0.3 mL IJ SOAJ injection Inject 0.3 mg into the muscle once. As needed (Patient not taking: Reported on 07/25/2021)     Glucagon (BAQSIMI TWO PACK) 3 MG/DOSE POWD Place 1 spray into the nose as directed. (Patient not taking: Reported on 12/27/2020) 2 each 3   HAILEY FE 1/20 1-20 MG-MCG tablet Take 1 tablet by mouth daily. (Patient not taking: Reported on 09/18/2021)     polyethylene glycol powder (GLYCOLAX/MIRALAX) 17 GM/SCOOP powder 17 g or one capful in G-Tube. (Patient not taking: Reported on 10/24/2021) 255 g 3   predniSONE (DELTASONE) 20 MG tablet Take 2 tablets daily with breakfast. (Patient not taking: Reported on 10/24/2021) 10 tablet 0   SLYND 4 MG TABS Take 1 tablet by mouth daily. (Patient not taking: Reported on 10/24/2021)     No current facility-administered medications on file prior to visit.   The medication list was reviewed and reconciled. All changes or newly prescribed medications were explained.  A complete medication list was provided to the  patient/caregiver.  Physical Exam BP 112/64 (BP Location: Left Arm, Patient Position: Sitting, Cuff Size: Normal)   Pulse 70   Ht 5' 3.86" (1.622 m)   Wt 125 lb (56.7 kg)   BMI 21.55 kg/m  Weight for age: Facility age limit for growth %iles  is 20 years.  Length for age: Facility age limit for growth %iles is 20 years. BMI: Body mass index is 21.55 kg/m. No results found. General: NAD, well nourished  HEENT: normocephalic, no eye or nose discharge.  MMM.  Mild swelling in cheeks.  Cardiovascular: warm and well perfused Lungs: Normal work of breathing, no rhonchi or stridor Skin: No birthmarks, no skin breakdown Abdomen: soft, non tender, non distended Extremities: No contractures or edema. Neuro: EOM intact, face symmetric. Moves all extremities equally and at least antigravity. No abnormal movements. Normal gait.     Diagnosis:  1. Goiter   2. Migraine without aura and without status migrainosus, not intractable   3. Hyperprolactinemia ([place])   4. Chronic fatigue   5. Mitochondrial disease ([place])   6. Myasthenia gravis-like symptoms   7. Dysautonomia ([place])      Assessment and Plan HALY FEHER is a 21 y.o. female with history of Chairi malformation s/p repair, tethered cord s/p repair, feeding intolerance s/p gastrostomy tube, lactose intolerance and alpha gal allergy, multiple hormonal irregularities including hypothyroidism, chronic headaches, chronic GI complaints, neuropathic pain in her feet, and symptoms concerning for mitochondrial diease although genetic testing and muscle biopsy unrevealingwho presents for follow-up in the pediatric complex care clinic.  Patient seen by case manager, dietician, integrated behavioral health today as well, please see accompanying notes.  I discussed case with all involved parties for coordination of care and recommend patient follow their instructions as below.   Symptom management:  HA have decreased in frequency and are well managed  with Nurtec. Discussed with mom and pt that this medication can be used preventatively, and may be effective, especially given effectiveness of medication at stopping headaches. For now, will continue this medication PRN for headaches. To address her hyper-ness on Ritalin, will decrease dose of 5 mg to help with energy on a smaller scale.  Will contimue all other medications as pain is well managed.   - Decrease Ritalin to 5 mg  - Refilled Lyrica, topamax, toperimate, and Nurtec today  Care coordination: - Placed referral for patient to see Dr. Tamala Julian for adult endocrinolgy per recommendation from Dr. Tobe Sos - Advised mom to reach out to dentist comfortable managing sedation for wisdom teeth removal  Care management needs:  - Pt in gap year for school.   Equipment needs:  - No new equipment needs today.   Decision making/Advanced care planning: - Not addressed at this visit, patient remains at full code.    The CARE PLAN for reviewed and revised to represent the changes above.  This is available in Epic under snapshot, and a physical binder provided to the patient, that can be used for anyone providing care for the patient.   I spent 30 minutes on day of service on this patient including review of chart, discussion with patient and family, discussion of screening results, coordination with other providers and management of orders and paperwork.     Return in about 6 months (around 04/25/2022).  I, Scharlene Gloss, scribed for and in the presence of Carylon Perches, MD at today's visit on 10/24/2021.   I, Carylon Perches MD MPH, personally performed the services described in this documentation, as scribed by Scharlene Gloss in my presence on 10/24/2021 and it is accurate, complete, and reviewed by me.    Carylon Perches MD MPH Neurology,  Neurodevelopment and Neuropalliative care [facility] Child Neurology  [address] [place], [place], [place] [address] Phone: [phone]  Fax: [phone]

## 2021-10-23 ENCOUNTER — Other Ambulatory Visit (INDEPENDENT_AMBULATORY_CARE_PROVIDER_SITE_OTHER): Payer: Self-pay | Admitting: Pediatrics

## 2021-10-24 ENCOUNTER — Encounter (INDEPENDENT_AMBULATORY_CARE_PROVIDER_SITE_OTHER): Payer: Self-pay | Admitting: Dietician

## 2021-10-24 ENCOUNTER — Ambulatory Visit (INDEPENDENT_AMBULATORY_CARE_PROVIDER_SITE_OTHER): Payer: PRIVATE HEALTH INSURANCE | Admitting: Pediatrics

## 2021-10-24 ENCOUNTER — Ambulatory Visit (INDEPENDENT_AMBULATORY_CARE_PROVIDER_SITE_OTHER): Payer: PRIVATE HEALTH INSURANCE | Admitting: Dietician

## 2021-10-24 ENCOUNTER — Encounter (INDEPENDENT_AMBULATORY_CARE_PROVIDER_SITE_OTHER): Payer: Self-pay | Admitting: Pediatrics

## 2021-10-24 VITALS — BP 112/64 | HR 72 | Ht 63.86 in | Wt 125.0 lb

## 2021-10-24 VITALS — BP 112/64 | HR 70 | Ht 63.86 in | Wt 125.0 lb

## 2021-10-24 DIAGNOSIS — D509 Iron deficiency anemia, unspecified: Secondary | ICD-10-CM

## 2021-10-24 DIAGNOSIS — R5382 Chronic fatigue, unspecified: Secondary | ICD-10-CM

## 2021-10-24 DIAGNOSIS — G901 Familial dysautonomia [Riley-Day]: Secondary | ICD-10-CM

## 2021-10-24 DIAGNOSIS — E221 Hyperprolactinemia: Secondary | ICD-10-CM | POA: Diagnosis not present

## 2021-10-24 DIAGNOSIS — Z931 Gastrostomy status: Secondary | ICD-10-CM | POA: Diagnosis not present

## 2021-10-24 DIAGNOSIS — G43009 Migraine without aura, not intractable, without status migrainosus: Secondary | ICD-10-CM | POA: Diagnosis not present

## 2021-10-24 DIAGNOSIS — G935 Compression of brain: Secondary | ICD-10-CM | POA: Diagnosis not present

## 2021-10-24 DIAGNOSIS — G7000 Myasthenia gravis without (acute) exacerbation: Secondary | ICD-10-CM

## 2021-10-24 DIAGNOSIS — E8840 Mitochondrial metabolism disorder, unspecified: Secondary | ICD-10-CM

## 2021-10-24 DIAGNOSIS — E049 Nontoxic goiter, unspecified: Secondary | ICD-10-CM | POA: Diagnosis not present

## 2021-10-24 MED ORDER — TOPIRAMATE ER 50 MG PO CAP24: ORAL_CAPSULE | ORAL | 5 refills | Status: DC

## 2021-10-24 MED ORDER — TOPIRAMATE 25 MG PO TABS: 25.0000 mg | ORAL_TABLET | Freq: Every day | ORAL | 5 refills | Status: DC

## 2021-10-24 MED ORDER — RA NUTRITIONAL SUPPORT PO POWD: ORAL | 12 refills | Status: DC

## 2021-10-24 MED ORDER — PREGABALIN 75 MG PO CAPS: 150.0000 mg | ORAL_CAPSULE | Freq: Every day | ORAL | 3 refills | Status: DC

## 2021-10-24 MED ORDER — METHYLPHENIDATE HCL 5 MG PO TABS
ORAL_TABLET | ORAL | 0 refills | Status: DC
Start: 2021-10-24 — End: 2022-05-08

## 2021-10-24 MED ORDER — PREGABALIN 25 MG PO CAPS: 25.0000 mg | ORAL_CAPSULE | Freq: Two times a day (BID) | ORAL | 5 refills | Status: DC

## 2021-10-24 NOTE — Patient Instructions (Signed)
Nutrition Recommendations: - Let's start adding in 1 scoop of beneprotein with each ensure clear carton at night to see this helps blood sugar control. Adjust as needed. I will put in an order with [facility].  - Incoproate some type of protein each time we eat (yogurt, cheese, nuts, seeds, ground flax seeds, chicken, fish, tofu, beans, tempeh, yogurt).

## 2021-10-24 NOTE — Patient Instructions (Addendum)
Decrease the Ritalin to 5 mg to see if it works for energy but does not make her very hyper.  Refilled all other medications  I placed a referral for Dr. Tamala Julian for endocrinology today.  Reach out to the Dentist you know for wisdom teeth removal.

## 2021-10-24 NOTE — Progress Notes (Signed)
RD faxed updated orders for 3 scoops Beneprotein to [place] @ [phone].

## 2021-11-03 ENCOUNTER — Encounter (INDEPENDENT_AMBULATORY_CARE_PROVIDER_SITE_OTHER): Payer: Self-pay | Admitting: Pediatrics

## 2021-11-14 ENCOUNTER — Other Ambulatory Visit (INDEPENDENT_AMBULATORY_CARE_PROVIDER_SITE_OTHER): Payer: Self-pay | Admitting: Family

## 2021-11-14 DIAGNOSIS — E8840 Mitochondrial metabolism disorder, unspecified: Secondary | ICD-10-CM

## 2021-11-14 DIAGNOSIS — J45909 Unspecified asthma, uncomplicated: Secondary | ICD-10-CM

## 2021-11-14 NOTE — Telephone Encounter (Signed)
Seen 10/2021 follow up sched 04/2022- refilled in Aug with 3 refills

## 2021-12-03 ENCOUNTER — Other Ambulatory Visit (INDEPENDENT_AMBULATORY_CARE_PROVIDER_SITE_OTHER): Payer: Self-pay | Admitting: Family

## 2021-12-03 DIAGNOSIS — E063 Autoimmune thyroiditis: Secondary | ICD-10-CM

## 2021-12-03 MED ORDER — SYNTHROID 88 MCG PO TABS
88.0000 ug | ORAL_TABLET | Freq: Every morning | ORAL | 0 refills | Status: DC
Start: 2021-12-03 — End: 2022-02-07

## 2021-12-23 ENCOUNTER — Ambulatory Visit (INDEPENDENT_AMBULATORY_CARE_PROVIDER_SITE_OTHER): Payer: PRIVATE HEALTH INSURANCE | Admitting: Gastroenterology

## 2021-12-23 VITALS — BP 134/83 | HR 69 | Temp 98.1°F | Ht 64.0 in | Wt 126.2 lb

## 2021-12-23 DIAGNOSIS — Z931 Gastrostomy status: Secondary | ICD-10-CM | POA: Diagnosis not present

## 2021-12-23 NOTE — Progress Notes (Signed)
Wyline Mood MD, MRCP(U.K) [address]  [address]  [place], [place] [address]  Main: [phone]  Fax: [phone]   Gastroenterology Consultation  Referring Provider:     Rudene Christians, DO Primary Care Physician:  Rudene Christians, DO Primary Gastroenterologist:  Dr. Wyline Mood  Reason for Consultation:  G tube         HPI:   CELESTA FUNDERBURK is a 21 y.o. y/o female referred for GI tube management.  She has a history of cherry 1 malformation, syringomyelia, mitochondrial disease, alpha gal hypersensitivity  She receives an infusion through her port for mitochondrial disease.  The purpose of the visit today was just to establish care with an adult GI if there were to have any issues in the future.  She suffers from chronic gastroparesis and has learned to change her diet no new changes in her symptoms recently Past Medical History:  Diagnosis Date   Allergy to alpha-gal    elevated IgE 04/10/16   Autonomic dysfunction    Chiari I malformation (HCC)    Complication of anesthesia    slow to wake up   Dyspnea    Eczema    GERD (gastroesophageal reflux disease)    Headache    Heart murmur    Hypoglycemia    Hypothyroid    IBS (irritable bowel syndrome)    Joint pain    Mitochondrial disease (HCC)    Muscle pain    Pneumonia    several times   PONV (postoperative nausea and vomiting)     Past Surgical History:  Procedure Laterality Date   ADENOIDECTOMY     2004or 2005   chiari decompression  2007   GALLBLADDER SURGERY  2015   GASTROSTOMY TUBE PLACEMENT     x 2   IR CV LINE INJECTION  07/06/2020   MUSCLE BIOPSY  2010   Port Replacement  05/2018   x 2 orignal 08/2016 then replacement 05/2018   PORT-A-CATH REMOVAL N/A 03/24/2019   Procedure: PORT-A-CATH REMOVAL;  Surgeon: Rodman Pickle, MD;  Location: [facility] OR;  Service: General;  Laterality: N/A;   PORTACATH PLACEMENT Right 03/24/2019   Procedure: PORT-A-CATH INSERTION under ultrasound and fluoroscopic  guidance;  Surgeon: Rodman Pickle, MD;  Location: [facility] OR;  Service: General;  Laterality: Right;   REMOVAL OF GASTROSTOMY TUBE N/A 03/24/2019   Procedure: Exchange of Gastrostomy Tube;  Surgeon: Rodman Pickle, MD;  Location: [facility] OR;  Service: General;  Laterality: N/A;   tethered cord  07/2006   TONSILLECTOMY     [id number]    Prior to Admission medications   Medication Sig Start Date End Date Taking? Authorizing Provider  albuterol (ACCUNEB) 0.63 MG/3ML nebulizer solution Take 82ml by nebulizer 3 times per day as needed for shortness of breath 05/16/21   [name], NP  albuterol (PROVENTIL HFA) 108 (90 Base) MCG/ACT inhaler Inhale 1-2 puffs into the lungs every 4 (four) hours as needed for wheezing or shortness of breath. 05/16/21 05/16/22  Wallis Bamberg, PA-C  ascorbic acid (VITAMIN C) 500 MG tablet Take 500 mg by mouth daily.  02/15/09   [provider]  azelastine (ASTELIN) 0.1 % nasal spray Place 2 sprays into both nostrils 2 (two) times daily. Use in each nostril as directed 04/10/16   Bobbitt, Heywood Iles, MD  cetirizine (ZYRTEC) 10 MG tablet Take 10 mg by mouth daily.    [provider]  clobetasol (OLUX) 0.05 % topical foam Apply topically 2 (two)  times daily. Apply to affected areas twice daily as needed taking care to avoid axillae and groin area. 04/10/16   Bobbitt, Heywood Iles, MD  Coenzyme Q-10 200 MG CAPS Take 1 capsule by mouth daily. 11/04/14   [provider]  Continuous Blood Gluc Receiver (DEXCOM G6 RECEIVER) DEVI 1 Device by Does not apply route as directed. 08/06/20   David Stall, MD  Continuous Blood Gluc Sensor (DEXCOM G6 SENSOR) MISC APPLY 1 SENSOR INTO THE SKIN AS DIRECTED(CHANGE SENSOR EVERY 10 DAYS) 10/04/21   David Stall, MD  Continuous Blood Gluc Transmit (DEXCOM G6 TRANSMITTER) MISC INJECT 1 DEVICE INTO THE SKIN AS DIRECTED (REUSE UP TO 8 TIMES WITH EACH NEW SENSOR) 09/04/21   David Stall, MD  EPINEPHrine 0.3  mg/0.3 mL IJ SOAJ injection Inject 0.3 mg into the muscle once. As needed Patient not taking: Reported on 07/25/2021    [provider]  Glucagon (BAQSIMI TWO PACK) 3 MG/DOSE POWD Place 1 spray into the nose as directed. Patient not taking: Reported on 12/27/2020 08/27/20   David Stall, MD  leucovorin (WELLCOVORIN) 25 MG tablet TAKE 1 TABLET BY MOUTH 2 TIMES A DAY 04/09/21   Margurite Auerbach, MD  levOCARNitine (CARNITOR) 1 GM/10ML solution TAKE 2 TEASPOONFUL ( ) BY MOUTH TWICEDAILY 07/29/17   Margurite Auerbach, MD  lidocaine (XYLOCAINE) 5 % ointment Apply 1 application topically as needed. 10/12/17   Margurite Auerbach, MD  metaxalone (SKELAXIN) 800 MG tablet TAKE 1/2 TABLET (400MG  TOTAL) BY MOUTH DAILY AS NEEDED (MUSCLE PAIN) 07/29/21   07/31/21, MD  methylphenidate (RITALIN) 5 MG tablet 1 tablet by mouth daily. 10/24/21   12/24/21, MD  montelukast (SINGULAIR) 10 MG tablet TAKE 1 TABLET BY MOUTH AT BEDTIME 11/14/21   13/2/23, NP  norethindrone-ethinyl estradiol (LOESTRIN) 1-20 MG-MCG tablet Take by mouth. 12/27/20   [provider]  Nutritional Supplements (NUTRITIONAL SUPPLEMENT PLUS) LIQD 3 cartons (711 mL) of Ensure Clear given via gtube daily. 01/16/21   03/16/21, NP  Nutritional Supplements (NUTRITIONAL SUPPLEMENT PLUS) LIQD 237-474 mL (1-2 cartons) Boost Breeze (peach flavored only) given po daily. 01/31/21   02/02/21, NP  Nutritional Supplements (RA NUTRITIONAL SUPPORT) POWD 3 scoops beneprotein given via gtube daily. 10/24/21   12/24/21, NP  ondansetron (ZOFRAN-ODT) 4 MG disintegrating tablet Take 1 tablet under the tongue every 6-8 hours as needed for nausea. 06/14/18   08/14/18, MD  polyethylene glycol powder (GLYCOLAX/MIRALAX) 17 GM/SCOOP powder 17 g or one capful in G-Tube. Patient not taking: Reported on 10/24/2021 07/20/21   Masters, 09/20/21, DO  predniSONE (DELTASONE) 20 MG tablet Take 2 tablets daily  with breakfast. Patient not taking: Reported on 10/24/2021 05/16/21   07/16/21, PA-C  pregabalin (LYRICA) 25 MG capsule Take 1 capsule (25 mg total) by mouth 2 (two) times daily. 10/24/21   12/24/21, MD  pregabalin (LYRICA) 75 MG capsule Take 2 capsules (150 mg total) by mouth at bedtime. 10/24/21   12/24/21, MD  promethazine (PHENERGAN) 25 MG tablet Take 1 tablet at onset of migraine. May repeat every 6 hours PRN 07/29/21   07/31/21, MD  pyridostigmine (MESTINON) 60 MG tablet Take 1 tablet (60 mg total) by mouth in the morning, at noon, in the evening, and at bedtime. 07/29/21   07/31/21, MD  RABEprazole (ACIPHEX) 20 MG tablet TAKE 1 TABLET BY MOUTH 2 TIMES A DAY 07/11/21   07/13/21,  Nolon Bussing, MD  rizatriptan (MAXALT) 10 MG tablet TAKE 1 TABLET BY MOUTH AS NEEDED FOR MIGRAINE. MAY REPEAT IN 2 HOURS IF NEEDED. 07/29/21   Margurite Auerbach, MD  SLYND 4 MG TABS Take 1 tablet by mouth daily. Patient not taking: Reported on 10/24/2021 08/22/21   [provider]  SYNTHROID 88 MCG tablet Take 1 tablet (88 mcg total) by mouth every morning. on an empty stomach 12/03/21   [name], NP  topiramate (TOPAMAX) 25 MG tablet Take 1 tablet (25 mg total) by mouth daily after breakfast. 10/24/21   Margurite Auerbach, MD  Topiramate ER (TROKENDI XR) 50 MG CP24 TAKE 1 CAPSULE BY MOUTH DAILY AT BEDTIME 10/24/21   Margurite Auerbach, MD  traZODone (DESYREL) 50 MG tablet TAKE 1/2 TABLET BY MOUTH 30 MINUTES PRIOR TO BEDTIME. IF NEEDED, MAY INCREASE TO 1 TABLET BEFORE BEDTIME 07/29/21   Margurite Auerbach, MD  vitamin E 200 UNIT capsule Take 200 Units by mouth daily.    [provider]    Family History  Problem Relation Age of Onset   Asthma Mother    Allergic rhinitis Mother    Heart murmur Mother    Allergic rhinitis Sister    Thyroid disease Sister    Allergic rhinitis Brother    Asthma Brother    Seizures Brother    Mitochondrial disorder Brother     Allergic rhinitis Maternal Grandmother    Asthma Maternal Grandmother    Diabetes type II Maternal Grandmother    Lung cancer Maternal Grandfather    Angioedema Neg Hx    Eczema Neg Hx    Immunodeficiency Neg Hx    Urticaria Neg Hx      Social History   Tobacco Use   Smoking status: Never    Passive exposure: Never   Smokeless tobacco: Never  Vaping Use   Vaping Use: Never used  Substance Use Topics   Alcohol use: No   Drug use: No    Allergies as of 12/23/2021 - Review Complete 12/23/2021  Allergen Reaction Noted   Other Anaphylaxis 03/29/2013   Latex Rash 04/08/2012   Lactated ringers  05/28/2018   Propofol Other (See Comments) 05/17/2012   Tape Itching and Other (See Comments) 05/17/2012    Review of Systems:    All systems reviewed and negative except where noted in HPI.   Physical Exam:  BP 134/83   Pulse 69   Temp 98.1 F (36.7 C) (Oral)   Ht 5\' 4"  (1.626 m)   Wt 126 lb 4 oz (57.3 kg)   BMI 21.67 kg/m  No LMP recorded. (Menstrual status: Oral contraceptives). Psych:  Alert and cooperative. Normal mood and affect. General:   Alert,  Well-developed, well-nourished, pleasant and cooperative in NAD Head:  Normocephalic and atraumatic. Abdomen: G-tube noted skin around the tube normal Neurologic:  Alert and oriented x3;  grossly normal neurologically. Psych:  Alert and cooperative. Normal mood and affect.  Imaging Studies: No results found.  Assessment and Plan:   DACEY MILBERGER is a 21 y.o. y/o female has been referred for management of her G-tube.  She has a history of Chiari I malformation, syringomyelia and has had a G-tube and port.  Recently replaced on 09/18/2021 with a 14 French inflatable tube.  She is here just to establish care with GI positioning from peds GI.  Suffers on chronic gastroparesis managed by her diet.  Follow up in as needed  Dr 11/18/2021 MD,MRCP(U.K)

## 2021-12-23 NOTE — Patient Instructions (Signed)
Please get in contact with [place] via MyChart in case you have any questions.  Happy Holidays!

## 2022-01-23 ENCOUNTER — Ambulatory Visit (INDEPENDENT_AMBULATORY_CARE_PROVIDER_SITE_OTHER): Payer: PRIVATE HEALTH INSURANCE | Admitting: Pediatrics

## 2022-01-23 ENCOUNTER — Ambulatory Visit (INDEPENDENT_AMBULATORY_CARE_PROVIDER_SITE_OTHER): Payer: PRIVATE HEALTH INSURANCE | Admitting: Dietician

## 2022-02-07 ENCOUNTER — Other Ambulatory Visit (INDEPENDENT_AMBULATORY_CARE_PROVIDER_SITE_OTHER): Payer: Self-pay | Admitting: Family

## 2022-02-07 DIAGNOSIS — E063 Autoimmune thyroiditis: Secondary | ICD-10-CM

## 2022-02-07 NOTE — Telephone Encounter (Signed)
Call to pharm advised to send to Jacqlyn Krauss at [facility] her new Endo provider for her synthroid

## 2022-02-11 ENCOUNTER — Telehealth (INDEPENDENT_AMBULATORY_CARE_PROVIDER_SITE_OTHER): Payer: Self-pay | Admitting: Family

## 2022-02-11 DIAGNOSIS — Z9189 Other specified personal risk factors, not elsewhere classified: Secondary | ICD-10-CM

## 2022-02-11 DIAGNOSIS — E8840 Mitochondrial metabolism disorder, unspecified: Secondary | ICD-10-CM

## 2022-02-11 DIAGNOSIS — R638 Other symptoms and signs concerning food and fluid intake: Secondary | ICD-10-CM

## 2022-02-11 DIAGNOSIS — Z95828 Presence of other vascular implants and grafts: Secondary | ICD-10-CM

## 2022-02-11 NOTE — Telephone Encounter (Signed)
I received a call from Laura Parrish with [facility] while at home visit with patient. She reports that Laura Parrish has been unusually fatigued and unable to take in adequate oral fluids. Her brother has strep throat and a viral infection, and Laura Parrish feels poorly as well. I recommended giving the "sick plan" infusion of D10 1/2 NS at 200cc/hr x 12 hours followed by D5 1/2 NS at 200cc/hr x 12 hours via portacath. Laura Parrish and her mother agreed with this plan. The order was faxed to [facility]. TG

## 2022-02-12 ENCOUNTER — Other Ambulatory Visit: Payer: Self-pay

## 2022-02-12 ENCOUNTER — Ambulatory Visit (INDEPENDENT_AMBULATORY_CARE_PROVIDER_SITE_OTHER): Payer: PRIVATE HEALTH INSURANCE | Admitting: Internal Medicine

## 2022-02-12 VITALS — BP 101/59 | HR 85 | Temp 98.3°F | Resp 28 | Ht 64.0 in | Wt 124.9 lb

## 2022-02-12 DIAGNOSIS — J069 Acute upper respiratory infection, unspecified: Secondary | ICD-10-CM

## 2022-02-12 LAB — RESP PANEL BY RT-PCR (RSV, FLU A&B, COVID)  RVPGX2
Influenza A by PCR: NEGATIVE
Influenza B by PCR: NEGATIVE
Resp Syncytial Virus by PCR: NEGATIVE
SARS Coronavirus 2 by RT PCR: NEGATIVE

## 2022-02-12 LAB — GROUP A STREP BY PCR: Group A Strep by PCR: NOT DETECTED

## 2022-02-12 NOTE — Progress Notes (Signed)
   CC: sore throat  HPI:[name] is a 22 y.o. female who presents for evaluation of sore throat. Please see individual problem based A/P for details.  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 Leafy Ro for OBGYN on loestrin and slynd, neuropathic pain with swelling in her hands feet, has port   Depression, PHQ-9: Based on the patients  [place] Office Visit from 05/31/2020 in [place]  PHQ-9 Total Score 0      score we have .  Past Medical History:  Diagnosis Date   Allergy to alpha-gal    elevated IgE 04/10/16   Autonomic dysfunction    Chiari I malformation (HCC)    Complication of anesthesia    slow to wake up   Dyspnea    Eczema    GERD (gastroesophageal reflux disease)    Headache    Heart murmur    Hypoglycemia    Hypothyroid    IBS (irritable bowel syndrome)    Joint pain    Mitochondrial disease (HCC)    Muscle pain    Pneumonia    several times   PONV (postoperative nausea and vomiting)    Review of Systems:   See hpi  Physical Exam: Vitals:   02/12/22 1036  BP: (!) 101/59  Pulse: 85  Resp: (!) 28  Temp: 98.3 F (36.8 C)  TempSrc: Oral  SpO2: 100%  Weight: 124 lb 14.4 oz (56.7 kg)  Height: 5\' 4"  (1.626 m)   General: nad On exam, patient appears well overall.  Nose without significant discharge, throat is clear, without erythema or edema or exudate. No cervical lymphadenopathy. Lungs are clear to auscultation. Port intac right upper chest and without surrounding edema or erythema. Heart is RRR.   Assessment & Plan:   See Encounters Tab for problem based charting.  Patient discussed with Dr. Jimmye Norman

## 2022-02-12 NOTE — Patient Instructions (Addendum)
Dear Mrs. Band,  Thank you for trusting [place] with your care today.  We discussed your sore throat.   We will test you for COVID, Flu, RSV, as well as Strep.  If this is related to COVID, we can start you on Paxlovid. You are outside the window to start Tamiflu.  We also recommend continuing supportive care.  If this is related to a strep infection, we will treat you with antibiotics. If this is viral, we recommend continued supportive care.  If your symptoms are not improving or worsening, please call our office and we can discuss starting antibiotics at that time.

## 2022-02-13 NOTE — Progress Notes (Signed)
Called to update regarding negative strep and COVID/Flu/RSV test. Mother reports that patient is still feeling quite poor with little improvement noted thus far. Still having significant fatigue. Mother reports that they are running second bag of IV fluids today. Discussed that we can can try to see her in person sometime early next week for URI follow up. If symptoms show signs of resolution, pt can cancel appointment. If symptoms worsen over the weekend, patient will go to UC/ED for further evaluation.

## 2022-02-14 ENCOUNTER — Encounter: Payer: Self-pay | Admitting: Internal Medicine

## 2022-02-14 DIAGNOSIS — J069 Acute upper respiratory infection, unspecified: Secondary | ICD-10-CM | POA: Insufficient documentation

## 2022-02-14 NOTE — Assessment & Plan Note (Signed)
Patient presents with 4 days of sore throat, sinus congestion with pressure/pain and production of yellow mucus. Associated headache. Has minimal cough but tends to have trouble coughing mucus up Endorses burning in middle of her chest which feels similar to when she had bronchitis in May. Chest feels less heavy this time. Also reports myalgias, some nausea and diarrhea. Reports low grade temperature of 99.8 Monday. Says she doesn't typically spike fever, even when sick. Sleeping more due to fatigue. Has some SHOB, got very winded walking from parking lot which is not typical for her. Has been using ibuprofen, sudafed, astalin nasal spray, eye drops, and breathing treatments for symptomatic control but that these do not last long enough. Has not used a neti pot. Symptoms feel worse compared to 4 days ago. Typically gets flu shot each year, but has not been well enough to get it yet this year. Has had 3 COVID shots. They are concerned for strep because patients brother is at home sick with strep throat. Mother reports they have a sick protocol that they follow when pt gets sick where they run 2L IVFs through her port per day. She gets D51/2NS infusions each week as well.   On exam, patient appears well overall. Nose without significant discharge, throat is clear, without erythema or edema or exudate. No cervical lymphadenopathy. Lungs are clear to auscultation. Port intac right upper chest and without surrounding edema or erythema. Heart is RRR.   Symptoms most consistent with viral URI. However, patient is high risk for progression of disease. She is s/p adenoidectomy and tonsillectomy. Also with multiple other comorbidities including a diagnosis of myasthenia gravis and unspecified mitochondrial disease for which she takes leucovorin which places her at higher risk for PNA. She has had several episodes of PNA previously. Also requires nutritional supplementation through G tube due to difficulty tolerating PO  feeding and is prone to dehydration. We have tested her for COVID, FLU, and RSV as well as strep, all of which were negative. We will continue supportive car for now with close follow up.

## 2022-02-19 ENCOUNTER — Other Ambulatory Visit (INDEPENDENT_AMBULATORY_CARE_PROVIDER_SITE_OTHER): Payer: Self-pay | Admitting: Pediatrics

## 2022-02-27 ENCOUNTER — Telehealth (INDEPENDENT_AMBULATORY_CARE_PROVIDER_SITE_OTHER): Payer: Self-pay | Admitting: Family

## 2022-02-27 DIAGNOSIS — E860 Dehydration: Secondary | ICD-10-CM

## 2022-02-27 DIAGNOSIS — E8840 Mitochondrial metabolism disorder, unspecified: Secondary | ICD-10-CM

## 2022-02-27 DIAGNOSIS — R5382 Chronic fatigue, unspecified: Secondary | ICD-10-CM

## 2022-02-27 NOTE — Progress Notes (Signed)
Internal Medicine Clinic Attending ° °Case discussed with Dr. Gawaluck  At the time of the visit.  We reviewed the resident’s history and exam and pertinent patient test results.  I agree with the assessment, diagnosis, and plan of care documented in the resident’s note.  °

## 2022-02-27 NOTE — Telephone Encounter (Signed)
Oren Section RN with [facility] called to report that Laura Parrish is having wisdom teeth removal today and has requested fluids for tomorrow as she recovers from the procedure today. I will send orders as requested for D5 1/2 NS at 200cc/hr via portacath for 48 hours. TG

## 2022-03-12 ENCOUNTER — Other Ambulatory Visit: Payer: Self-pay

## 2022-03-12 MED ORDER — TRAZODONE HCL 50 MG PO TABS: ORAL_TABLET | ORAL | 1 refills | Status: DC

## 2022-04-24 NOTE — Progress Notes (Signed)
Medical Nutrition Therapy - Progress Note Appt start time: 10:00 AM  Appt end time: 10:40 AM  Reason for referral: Gtube dependence Referring provider: Dr. Artis Flock - [facility] Pertinent medical hx: Chiari I malformation, syringomyelia, mitochondrial disease, iron-deficiency anemia, hypoglycemia, goiter, chronic diarrhea, chronic dehydration, alpha-gal hypersensitivity, +G-tube, +port (receives weekly infusions)  Assessment: Food allergies: red meat (can consume poultry), lactose intolerance Pertinent Medications: see medication list Vitamins/Supplements: Co-Q10, Vitamin C (500 mg), vitamin E (200 IU), Calcium (50 mg)  Pertinent labs:  (12/28) TSH - <0.05 (low) (12/28) Free T4 - 1.1 (WNL) (12/28) Free T3 - 3.85 (WNL)  (4/25) Anthropometrics: Ht: 162 cm  Wt: 55.4 kg  BMI: 21.09 kg/m^2  IBW based on hamwi equation @ 56.8 kg (+/-10%)  02/12/22 Wt: 56.2 kg 12/23/21 Wt: 57.26 kg 10/24/21 Wt: 56.8 kg 09/18/21 Wt: 58.6 kg 07/25/21 Wt: 56.4 kg 06/13/21 Wt: 59.5 kg 01/31/21 Wt: 56.7 kg  Estimated minimum caloric needs: 1900-2100 kcal/day (EER)  Estimated minimum protein needs: 0.8 g/kg/day (DRI) Estimated minimum fluid needs: 1750-1950 mL/day (30-35 mL/kg)   Primary concerns today: Follow-up for Gtube dependence.  Mom accompanied pt to appt today.   Dietary Intake Hx: DME: [facility], fax: [phone] Usual eating pattern includes: 3 meals and 3-4 snacks per day.  How long does it usually take to finish a meal: <30 minutes  Texture modifications: chopped foods  Chewing or swallowing difficulties with foods and/or liquids: coughing only with foods, occasionally coughing with liquids   Usual feeding regimen: 474 mL (2 cans) Ensure clear @ 60 mL/hr for 8 hours (10 PM - 6 AM); 2-3 bags of D5 infusions weekly @ 200 mL/hr x 12-14 hours   24-hr recall:  Breakfast: smoothie (pb, banana, oatmeal, 2%  fairlife lactose free) OR 2 packet oatmeal w/ water OR 1 slice of toast w/ nutella + fruit coffee or  tea  Lunch: 2 chicken tenders + fruit or chips OR small amount chicken salad w/ crackers + fruit OR pb and J Dinner: whatever family is having (starch, protein, vegetable)   Typical Snacks: mini muffins, fruit, activia, kind bars, protein bars  Typical Beverages: water (~64 oz), juice (1-2 bottles depending on blood sugar), pedialyte (1-2 cartons), Poppy's (prebiotic soda)  PO Supplements: 1 boost breeze  Previous Supplements Tried: Ensure Clear, Boost Breeze, Peptamen (no known concerns), Milk-based supplements (upset stomach)   Notes: Mom and Johnasia report they still have not received beneprotein (order was resent in October). However, Shimeka has been working to add in more protein to her diet (meat, protein bar, collagen powder, etc). Sherran does feel her blood sugars have become more stabilized since addition of protein. RD and Oyinkansola discussed weight, however Perpetua reports UBW of 120-125 #.   GI: daily (liquid), continued mix of constipation/diarrhea - followed by GI   Physical Activity: normal ADL and walking dog, swimming at the pool, yoga  Estimated intake likely meeting needs given adequate growth.  Pt consuming various food groups.  Pt consuming adequate amounts of each food group.   Estimated Intake Based on 3 Boost Breeze:  Estimated caloric intake: 720 kcal/day - meets 34-38% of estimated needs.  Estimated protein intake: 0.4 g/kg/day - meets 50% of estimated needs.  Estimated fluid intake: 597 mL/day - meets 30-34% of estimated needs.   Nutrition Diagnosis: (1/19) Inadequate oral intake related to mitochondrial disorder causing decreased appetite and poor PO feeding as evidence by G-tube dependent to meet caloric, protein, and fluid needs.  Intervention: Discussed pt's growth and current  regimen. Discussed protein supplement options given need for protein supplement without additional carbohydrates due to blood glucose concerns - RD and Faven decided on prosource packet added to  overnight feeds or water flush. Discussed recommendations below. All questions answered, family in agreement with plan.   Nutrition Recommendations: - Try a probiotic to see if this helps some with your stomach upset.  - Feel free to try protein powder or a protein shake as your coffee creamer.  - Let's start doing 1 packet of prosource per day added into overnight feeds. I will put in an order with [facility] today. Please call them tomorrow and let me know if you have any troubles and if so we can switch DME companies if necessary.   This regimen would provide: 750 kcal/day, 0.5 g protein/kg/day, 597 mL/day.  Teach back method used.  Monitoring/Evaluation: Continue to Monitor: - Growth trends - PO intake  - TF tolerance  Follow-up in December 2024.  Total time spent in counseling: 40 minutes.

## 2022-05-01 ENCOUNTER — Encounter (INDEPENDENT_AMBULATORY_CARE_PROVIDER_SITE_OTHER): Payer: Self-pay | Admitting: Family

## 2022-05-01 NOTE — Progress Notes (Signed)
Patient: Laura Parrish MRN: [id number] Sex: female DOB: 2000/06/19  Provider: [name], MD Location of Care: Pediatric Specialist- Pediatric Complex Care Note type: Routine return visit  History of Present Illness: Referral Source: Laura Likens, MD History from: patient and prior records Chief Complaint: Complex Care  [name] is a 22 y.o. 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 Patient was last seen 10/24/21 where I decreased Ritalin and refilled all other medications.  Since that appointment, patient has had no ED visits or hospitalizations.   Patient presents today with her mother. They report the following:   Symptom management:  Headaches are improving, have them less frequently. When she does have them, the Nurtec helps lots. Discussed that this medication can be used preventatively, but patient prefers to use it abortively for now.  Leg pain is "not horrible." Continues to take 150 mg of gabapentin at night and 25 mg during the day. She does report some feelings of restless legs at night.  Having stomach pain related to constipation, RD recommended starting probiotic. Continues to take mestinon 3 times a day. Has not been able to get to 4 times a day. When sick, she takes it up to 5 times daily (has to wake up in the middle of the night to take).   Sleep is okay right now. Falls asleep okay, not taking Trazodone as she was not feeling it make her sleepy. Fatigue during the day is alright. Not needing Ritalin anymore. Feels the fogginess she had before is  related to low blood sugar.   Interested in botox in hands for raynauds. She reports it has become unmanageable.   Care coordination (other providers): She did established with Adult GI, Dr. Tobi Bastos, on 12/23/21, recommended follow up PRN. She has also continued to have her g-tube changed and granulation tissue managed at [facility] by Dr. Sheliah Hatch with Gen surgery, most recently on 04/02/21.  Referred for Dr. Cheral Almas for adult endo at the last appointment. She saw him on 01/09/22 where he checked thyroid levels and continued synthroid, f/u in 6 mo.   Recommended mom reach out to dentist for wisdom teeth removal at the last appointment.   She is interested in seeing Allergist right now. Concern for allergies with food. Has eyes, nose and throat irritation. Having reaction to pollen as well.   Care management needs:  Laura Parrish, [facility] has completed DMV paperwork to allow her to drive despite hx of muscle weakness. She is also needing a new handicap placard.    She will continue on her parents insurance until 9. Then will petition to remain as she lives in their home. Also has Cap-DA. Case manager is Laura Parrish with [employer].   Not in school right now. 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 Right now she is resting lots.   Equipment needs:  No new equipment needs.    Past Medical History Past Medical History:  Diagnosis Date   Allergy to alpha-gal    elevated IgE 04/10/16   Autonomic dysfunction    Chiari I malformation (HCC)    Complication of anesthesia    slow to wake up  Dyspnea    Eczema    GERD (gastroesophageal reflux disease)    Headache    Heart murmur    Hypoglycemia    Hypothyroid    IBS (irritable bowel syndrome)    Joint pain    Mitochondrial disease (HCC)    Muscle pain    Pneumonia    several times   PONV (postoperative nausea and vomiting)     Surgical History Past Surgical History:  Procedure Laterality Date   ADENOIDECTOMY      2004or 2005   chiari decompression  2007   GALLBLADDER SURGERY  2015   GASTROSTOMY TUBE PLACEMENT     x 2   IR CV LINE INJECTION  07/06/2020   MUSCLE BIOPSY  2010   Port Replacement  05/2018   x 2 orignal 08/2016 then replacement 05/2018   PORT-A-CATH REMOVAL N/A 03/24/2019   Procedure: PORT-A-CATH REMOVAL;  Surgeon: Rodman Pickle, MD;  Location: [facility] OR;  Service: General;  Laterality: N/A;   PORTACATH PLACEMENT Right 03/24/2019   Procedure: PORT-A-CATH INSERTION under ultrasound and fluoroscopic guidance;  Surgeon: Rodman Pickle, MD;  Location: [facility] OR;  Service: General;  Laterality: Right;   REMOVAL OF GASTROSTOMY TUBE N/A 03/24/2019   Procedure: Exchange of Gastrostomy Tube;  Surgeon: Rodman Pickle, MD;  Location: [facility] OR;  Service: General;  Laterality: N/A;   tethered cord  07/2006   TONSILLECTOMY     [id number]    Family History family history includes Allergic rhinitis in her brother, maternal grandmother, mother, and sister; Asthma in her brother, maternal grandmother, and mother; Diabetes type II in her maternal grandmother; Heart murmur in her mother; Lung cancer in her maternal grandfather; Mitochondrial disorder in her brother; Seizures in her brother; Thyroid disease in her sister.   Social History Social History   Social History Narrative   Diamantina graduated from [employer].    Graduated from [employer]. Currently taking break then returning for masters at another school.    She enjoys reading, playing with her cat and her dog, and hanging out with her sister.     Allergies Allergies  Allergen Reactions   Other Anaphylaxis    Alpha Gal allergy \ Red meat\ any hoof animal   Latex Rash   Lactated Ringers     Contraindicated w/ mitochondrial syndrome   Propofol Other (See Comments)    Due to possible mitochondrial disorder Due to possible mitochondrial disorder Due to possible mitochondrial disorder   Tape Itching and  Other (See Comments)    Medications Current Outpatient Medications on File Prior to Visit  Medication Sig Dispense Refill   albuterol (ACCUNEB) 0.63 MG/3ML nebulizer solution Take 3ml by nebulizer 3 times per day as needed for shortness of breath 270 mL 5   albuterol (PROVENTIL HFA) 108 (90 Base) MCG/ACT inhaler Inhale 1-2 puffs into the lungs every 4 (four) hours as needed for wheezing or shortness of breath. 18 g 0   azelastine (ASTELIN) 0.1 % nasal spray Place 2 sprays into both nostrils 2 (two) times daily. Use in each nostril as directed 30 mL 5   cetirizine (ZYRTEC) 10 MG tablet Take 10 mg by mouth daily.     clobetasol (OLUX) 0.05 % topical foam Apply topically 2 (two) times daily. Apply to affected areas twice daily as needed taking care to avoid axillae and groin area. 45 g 3   Coenzyme Q-10 200 MG CAPS Take 1 capsule by mouth daily.     Continuous  Blood Gluc Receiver (DEXCOM G6 RECEIVER) DEVI 1 Device by Does not apply route as directed. 1 each 2   Continuous Blood Gluc Sensor (DEXCOM G6 SENSOR) MISC APPLY 1 SENSOR INTO THE SKIN AS DIRECTED(CHANGE SENSOR EVERY 10 DAYS) 3 each 11   Continuous Blood Gluc Transmit (DEXCOM G6 TRANSMITTER) MISC INJECT 1 DEVICE INTO THE SKIN AS DIRECTED (REUSE UP TO 8 TIMES WITH EACH NEW SENSOR) 1 each 3   EPINEPHrine 0.3 mg/0.3 mL IJ SOAJ injection Inject 0.3 mg into the muscle once. As needed     Glucagon (BAQSIMI TWO PACK) 3 MG/DOSE POWD Place 1 spray into the nose as directed. 2 each 3   leucovorin (WELLCOVORIN) 25 MG tablet TAKE 1 TABLET BY MOUTH 2 TIMES A DAY 180 tablet 1   levOCARNitine (CARNITOR) 1 GM/10ML solution TAKE 2 TEASPOONFUL ( ) BY MOUTH TWICEDAILY 118 mL 12   lidocaine (XYLOCAINE) 5 % ointment Apply 1 application topically as needed. 35.44 g 3   metaxalone (SKELAXIN) 800 MG tablet TAKE 1/2 TABLET (400MG  TOTAL) BY MOUTH DAILY AS NEEDED (MUSCLE PAIN) 30 tablet 3   montelukast (SINGULAIR) 10 MG tablet TAKE 1 TABLET BY MOUTH AT BEDTIME 30  tablet 5   Nutritional Supplements (NUTRITIONAL SUPPLEMENT PLUS) LIQD 3 cartons (711 mL) of Ensure Clear given via gtube daily. [address] mL 12   Nutritional Supplements (NUTRITIONAL SUPPLEMENT PLUS) LIQD 237-474 mL (1-2 cartons) Boost Breeze (peach flavored only) given po daily. 14694 mL 12   ondansetron (ZOFRAN-ODT) 4 MG disintegrating tablet Take 1 tablet under the tongue every 6-8 hours as needed for nausea. 30 tablet 5   polyethylene glycol powder (GLYCOLAX/MIRALAX) 17 GM/SCOOP powder 17 g or one capful in G-Tube. 255 g 3   pregabalin (LYRICA) 25 MG capsule Take 1 capsule (25 mg total) by mouth 2 (two) times daily. 60 capsule 5   promethazine (PHENERGAN) 25 MG tablet Take 1 tablet at onset of migraine. May repeat every 6 hours PRN 30 tablet 0   RABEprazole (ACIPHEX) 20 MG tablet TAKE 1 TABLET BY MOUTH 2 TIMES A DAY 60 tablet 6   SLYND 4 MG TABS Take 1 tablet by mouth daily.     SYNTHROID 88 MCG tablet TAKE 1 TABLET BY MOUTH EVERY MORNING ON AN EMPTY STOMACH 90 tablet 3   traZODone (DESYREL) 50 MG tablet TAKE 1/2 TABLET BY MOUTH 30 MINUTES PRIOR TO BEDTIME. IF NEEDED, MAY INCREASE TO 1 TABLET BEFORE BEDTIME 30 tablet 1   vitamin E 200 UNIT capsule Take 200 Units by mouth daily.     [DISCONTINUED] Nutritional Supplements (RA NUTRITIONAL SUPPORT) POWD 3 scoops beneprotein given via gtube daily. 651 g 12   ascorbic acid (VITAMIN C) 500 MG tablet Take 500 mg by mouth daily.  (Patient not taking: Reported on 05/08/2022)     norethindrone-ethinyl estradiol (LOESTRIN) 1-20 MG-MCG tablet Take by mouth.     predniSONE (DELTASONE) 20 MG tablet Take 2 tablets daily with breakfast. 10 tablet 0   No current facility-administered medications on file prior to visit.   The medication list was reviewed and reconciled. All changes or newly prescribed medications were explained.  A complete medication list was provided to the patient/caregiver.  Physical Exam BP 100/64 (BP Location: Left Arm, Patient Position:  Sitting, Cuff Size: Normal)   Pulse 60   Ht 5' 3.78" (1.62 m)   Wt 122 lb (55.3 kg)   LMP 01/06/2022 (Exact Date)   BMI 21.09 kg/m  Weight for age: Facility age limit for growth %  iles is 20 years.  Length for age: Facility age limit for growth %iles is 20 years. BMI: Body mass index is 21.09 kg/m. No results found. General: NAD, well nourished  HEENT: normocephalic, no eye or nose discharge.  MMM  Cardiovascular: warm and well perfused Lungs: Normal work of breathing, no rhonchi or stridor Skin: No birthmarks, no skin breakdown Abdomen: soft, non tender, non distended Extremities: No contractures or edema. Neuro: EOM intact, face symmetric. Moves all extremities equally and at least antigravity. No abnormal movements. Normal gait.     Diagnosis:  1. Alpha-gal hypersensitivity   2. Migraine without aura and without status migrainosus, not intractable   3. Allergy to insect bites and stings   4. Iron deficiency anemia, unspecified iron deficiency anemia type   5. Thyroiditis, autoimmune   6. Alteration in metabolic regulation      Assessment and Plan Laura Parrish is a 22 y.o. female with history of Chairi malformation s/p repair, tethered cord s/p repair, feeding intolerance s/p gastrostomy tube, lactose intolerance and alpha gal allergy, multiple hormonal irregularities including hypothyroidism, chronic headaches, chronic GI complaints, neuropathic pain in her feet, and symptoms concerning for mitochondrial diease although genetic testing and muscle biopsy unrevealing who presents for follow-up in the pediatric complex care clinic.  Patient seen by case manager, dietician, integrated behavioral health today as well, please see accompanying notes.  I discussed case with all involved parties for coordination of care and recommend patient follow their instructions as below.   Symptom management:  Headaches and leg pain are well managed with current medication regimen, will continue  this today. She reports feeling of restless legs at night. Explained that a common cause of this is low iron, will check levels today. If low, recommend restarting iron supplement.   - Continued all medications - Ordered CBC, CMP, Iron and ferritin level, Vit D level, and thyroid labs per recommendations from Dr. Katrinka Blazing today  Care coordination: Reviewed current specialist providers, recommended - Establishing with an Allergist, referred to Dr. Allena Katz today.  - Follow up with Dr. Corliss Skains to discuss worsening raynauds, including possibility of botox.  - Follow up with Dr. Dalbert Garnet for OBGYN. - Follow up with ENT, Do. Verne Spurr, and audiology.  - Follow up with Pulmonology to talk about difficulty breathing with recent illness. Scheduled follow up with Dr. Damita Lack on 09/19/22 at 8:30 am.  Care management needs:  - Patient continues to need Home Health Services.   Equipment needs:  - No new equipment needs at this time  Decision making/Advanced care planning: - Not addressed at this visit, patient remains at full code.    The CARE PLAN for reviewed and revised to represent the changes above.  This is available in Epic under snapshot, and a physical binder provided to the patient, that can be used for anyone providing care for the patient.   I spent 40 minutes on day of service on this patient including review of chart, discussion with patient and family, discussion of screening results, coordination with other providers and management of orders and paperwork.     Return in about 6 months (around 11/07/2022).  I, Mayra Reel, scribed for and in the presence of [name], MD at today's visit on 05/08/2022.   I, [name] MD MPH, personally performed the services described in this documentation, as scribed by Mayra Reel in my presence on 05/08/2022 and it is accurate, complete, and reviewed by me.    [name] MD MPH Neurology,  Neurodevelopment and Neuropalliative  care [facility] Child Neurology  [address] [place], [place], [place] [address] Phone: [phone] Fax: [phone]

## 2022-05-01 NOTE — Progress Notes (Signed)
The [place] DMV form was completed and faxed to the [facility] today. TG

## 2022-05-08 ENCOUNTER — Ambulatory Visit (INDEPENDENT_AMBULATORY_CARE_PROVIDER_SITE_OTHER): Payer: PRIVATE HEALTH INSURANCE | Admitting: Dietician

## 2022-05-08 ENCOUNTER — Telehealth (INDEPENDENT_AMBULATORY_CARE_PROVIDER_SITE_OTHER): Payer: Self-pay | Admitting: Dietician

## 2022-05-08 ENCOUNTER — Ambulatory Visit (INDEPENDENT_AMBULATORY_CARE_PROVIDER_SITE_OTHER): Payer: PRIVATE HEALTH INSURANCE | Admitting: Pediatrics

## 2022-05-08 ENCOUNTER — Encounter (INDEPENDENT_AMBULATORY_CARE_PROVIDER_SITE_OTHER): Payer: Self-pay | Admitting: Dietician

## 2022-05-08 VITALS — BP 100/64 | HR 60 | Ht 63.78 in | Wt 122.0 lb

## 2022-05-08 DIAGNOSIS — Z931 Gastrostomy status: Secondary | ICD-10-CM | POA: Diagnosis not present

## 2022-05-08 DIAGNOSIS — E063 Autoimmune thyroiditis: Secondary | ICD-10-CM | POA: Diagnosis not present

## 2022-05-08 DIAGNOSIS — E8840 Mitochondrial metabolism disorder, unspecified: Secondary | ICD-10-CM | POA: Diagnosis not present

## 2022-05-08 DIAGNOSIS — G43009 Migraine without aura, not intractable, without status migrainosus: Secondary | ICD-10-CM | POA: Diagnosis not present

## 2022-05-08 DIAGNOSIS — R638 Other symptoms and signs concerning food and fluid intake: Secondary | ICD-10-CM | POA: Diagnosis not present

## 2022-05-08 DIAGNOSIS — Z91014 Allergy to mammalian meats: Secondary | ICD-10-CM

## 2022-05-08 DIAGNOSIS — Z91038 Other insect allergy status: Secondary | ICD-10-CM

## 2022-05-08 DIAGNOSIS — G935 Compression of brain: Secondary | ICD-10-CM

## 2022-05-08 DIAGNOSIS — T7800XA Anaphylactic reaction due to unspecified food, initial encounter: Secondary | ICD-10-CM

## 2022-05-08 DIAGNOSIS — D509 Iron deficiency anemia, unspecified: Secondary | ICD-10-CM

## 2022-05-08 LAB — CBC WITH DIFFERENTIAL/PLATELET
Basophils Relative: 1 %
Eosinophils Absolute: 281 cells/uL (ref 15–500)
Neutrophils Relative %: 48.9 %
RBC: 5.1 10*6/uL (ref 3.80–5.10)

## 2022-05-08 MED ORDER — TOPIRAMATE 25 MG PO TABS: 25.0000 mg | ORAL_TABLET | Freq: Every day | ORAL | 5 refills | Status: DC

## 2022-05-08 MED ORDER — RA NUTRITIONAL SUPPORT PO POWD: ORAL | 12 refills | Status: DC

## 2022-05-08 MED ORDER — PREGABALIN 75 MG PO CAPS: 150.0000 mg | ORAL_CAPSULE | Freq: Every day | ORAL | 3 refills | Status: DC

## 2022-05-08 MED ORDER — RIZATRIPTAN BENZOATE 10 MG PO TABS: ORAL_TABLET | ORAL | 3 refills | Status: DC

## 2022-05-08 MED ORDER — TOPIRAMATE ER 50 MG PO CAP24: ORAL_CAPSULE | ORAL | 5 refills | Status: DC

## 2022-05-08 MED ORDER — PYRIDOSTIGMINE BROMIDE 60 MG PO TABS: 60.0000 mg | ORAL_TABLET | Freq: Four times a day (QID) | ORAL | 5 refills | Status: DC

## 2022-05-08 MED ORDER — NURTEC 75 MG PO TBDP: 75.0000 mg | ORAL_TABLET | ORAL | 3 refills | Status: DC | PRN

## 2022-05-08 NOTE — Patient Instructions (Signed)
Nutrition Recommendations: - Try a probiotic to see if this helps some with your stomach upset.  - Feel free to try protein powder or a protein shake as your coffee creamer.  - Let's start doing 1 packet of prosource per day added into overnight feeds. I will put in an order with [facility] today. Please call them tomorrow and let me know if you have any troubles and if so we can switch DME companies if necessary.

## 2022-05-08 NOTE — Telephone Encounter (Signed)
Who's calling (name and relationship to patient) :Meriam Sprague   Best contact [phone]  Provider they see: John Giovanni  Reason for call:Lakeeta from [facility] called in stating a order was sent for Prosource and was wondering if it could be completed for Prostat. Prostat is liquid  not in packets. Lakeet a is asking for a call back.    Call ID:      PRESCRIPTION REFILL ONLY  Name of prescription:  Pharmacy:

## 2022-05-08 NOTE — Patient Instructions (Addendum)
Continued all your medications today.  Sent labs for CBC, and CMP, Iron, Vit D, and thyroid labs (same as Dr. Katrinka Blazing).  If your ferritin levels are below 70 then I recommend starting Vitron C again.  Referred for Allergist, Dr. Allena Katz today. They should call you to schedule.  Follow up with Dr. Corliss Skains for your raynauds. Phone: [phone] Recommend following up with Dr. Dalbert Garnet for OBGYN. Phone: [phone].  Follow up with ENT, Do. Leinbach, and audiology. Phone: [phone]  Scheduled follow up with Dr. Damita Lack on 09/19/22 at 8:30 am. Talk with him about difficulty breathing with illness.

## 2022-05-08 NOTE — Progress Notes (Signed)
RD faxed orders for 1 Prosource packet given via gtube daily to [facility] @ [phone].

## 2022-05-09 ENCOUNTER — Encounter (INDEPENDENT_AMBULATORY_CARE_PROVIDER_SITE_OTHER): Payer: Self-pay

## 2022-05-09 LAB — COMPREHENSIVE METABOLIC PANEL
ALT: 9 U/L (ref 6–29)
Albumin: 5.2 g/dL — ABNORMAL HIGH (ref 3.6–5.1)
Alkaline phosphatase (APISO): 109 U/L (ref 31–125)
Globulin: 2.6 g/dL (calc) (ref 1.9–3.7)
Glucose, Bld: 84 mg/dL (ref 65–99)
Total Bilirubin: 1.3 mg/dL — ABNORMAL HIGH (ref 0.2–1.2)

## 2022-05-09 LAB — CBC WITH DIFFERENTIAL/PLATELET
Absolute Monocytes: 433 cells/uL (ref 200–950)
Monocytes Relative: 7.1 %
Neutro Abs: 2983 cells/uL (ref 1500–7800)
RDW: 12.2 % (ref 11.0–15.0)
WBC: 6.1 10*3/uL (ref 3.8–10.8)

## 2022-05-09 LAB — IRON,TIBC AND FERRITIN PANEL: TIBC: 328 mcg/dL (calc) (ref 250–450)

## 2022-05-09 NOTE — Telephone Encounter (Signed)
RD returned phone call and left HIPAA compliant voicemail explaining why Prosource is indicated rather than Prostat. RD requested callback or email (email address provided).

## 2022-05-12 ENCOUNTER — Other Ambulatory Visit (INDEPENDENT_AMBULATORY_CARE_PROVIDER_SITE_OTHER): Payer: Self-pay | Admitting: Dietician

## 2022-05-12 ENCOUNTER — Encounter (INDEPENDENT_AMBULATORY_CARE_PROVIDER_SITE_OTHER): Payer: Self-pay | Admitting: Pediatrics

## 2022-05-12 DIAGNOSIS — Z931 Gastrostomy status: Secondary | ICD-10-CM

## 2022-05-12 LAB — T4, FREE: Free T4: 1 ng/dL (ref 0.8–1.8)

## 2022-05-12 LAB — IRON,TIBC AND FERRITIN PANEL
%SAT: 38 % (calc) (ref 16–45)
Ferritin: 52 ng/mL (ref 16–154)
Iron: 125 ug/dL (ref 40–190)

## 2022-05-12 LAB — CBC WITH DIFFERENTIAL/PLATELET
Basophils Absolute: 61 cells/uL (ref 0–200)
Eosinophils Relative: 4.6 %
HCT: 43.4 % (ref 35.0–45.0)
Hemoglobin: 14.4 g/dL (ref 11.7–15.5)
Lymphs Abs: 2342 cells/uL (ref 850–3900)
MCH: 28.2 pg (ref 27.0–33.0)
MCHC: 33.2 g/dL (ref 32.0–36.0)
MCV: 85.1 fL (ref 80.0–100.0)
MPV: 10.2 fL (ref 7.5–12.5)
Platelets: 334 10*3/uL (ref 140–400)
Total Lymphocyte: 38.4 %

## 2022-05-12 LAB — THYROID PEROXIDASE ANTIBODIES (TPO) (REFL): Thyroperoxidase Ab SerPl-aCnc: 138 IU/mL — ABNORMAL HIGH (ref <9)

## 2022-05-12 LAB — COMPREHENSIVE METABOLIC PANEL
AG Ratio: 2 (calc) (ref 1.0–2.5)
AST: 16 U/L (ref 10–30)
BUN: 11 mg/dL (ref 7–25)
CO2: 19 mmol/L — ABNORMAL LOW (ref 20–32)
Calcium: 10 mg/dL (ref 8.6–10.2)
Chloride: 107 mmol/L (ref 98–110)
Creat: 0.79 mg/dL (ref 0.50–0.96)
Potassium: 4.2 mmol/L (ref 3.5–5.3)
Sodium: 140 mmol/L (ref 135–146)
Total Protein: 7.8 g/dL (ref 6.1–8.1)

## 2022-05-12 LAB — HEMOGLOBIN A1C
Hgb A1c MFr Bld: 5.1 % of total Hgb (ref <5.7)
Mean Plasma Glucose: 100 mg/dL
eAG (mmol/L): 5.5 mmol/L

## 2022-05-12 LAB — TSH+FREE T4: TSH W/REFLEX TO FT4: 5.72 mIU/L — ABNORMAL HIGH

## 2022-05-12 LAB — T3, FREE: T3, Free: 2.8 pg/mL (ref 2.3–4.2)

## 2022-05-12 LAB — VITAMIN D 25 HYDROXY (VIT D DEFICIENCY, FRACTURES): Vit D, 25-Hydroxy: 35 ng/mL (ref 30–100)

## 2022-05-12 MED ORDER — NUTRITIONAL SUPPLEMENT PLUS PO LIQD
ORAL | 12 refills | Status: DC
Start: 2022-05-12 — End: 2023-11-03

## 2022-05-12 NOTE — Progress Notes (Signed)
Orders updated for 30 mL Prostat given with 30-60 mL water flush given [facility]'s inability to be able to provide Prosource supplementation.

## 2022-05-18 ENCOUNTER — Encounter (INDEPENDENT_AMBULATORY_CARE_PROVIDER_SITE_OTHER): Payer: Self-pay | Admitting: Pediatrics

## 2022-06-14 ENCOUNTER — Other Ambulatory Visit (INDEPENDENT_AMBULATORY_CARE_PROVIDER_SITE_OTHER): Payer: Self-pay | Admitting: Family

## 2022-06-15 NOTE — Progress Notes (Unsigned)
NEW PATIENT Date of Service/Encounter:  06/16/22 Referring provider: Margurite Auerbach, MD Primary care provider: Rudene Christians, DO  Subjective:  Laura Parrish is a 22 y.o. 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 History obtained from: chart review and patient and mother.   Has a history of alpha gal. She is eating mammalian meat sometimes.  If she eats too much in a short period of time and gets a rash around her mouth.  She did develop hives.  She felt her throat was closing.  She does carry an epipen. She took benadryl and this resolved her symptoms.  Allergic reactions: Randomly she will develop stomach pain, hives and a rash around her mouth and tingly throat.  She has not identified any specific trigger. Cut out dairy and this didn't improve symptoms. She also tried to cut out gluten and tree nuts and peanuts without resolution of symptoms. Happening every couple of days. She doesn't feel it is her alpha-gal because she has been mostly avoiding mammalian meats. Stress will make her have hives. These symptoms are not always occurring around meals. She does take a zyrtec every day. These symptoms have been ongoing for easily 10 years. She does have stress hives since she was very young.  Hives last for hours. Never more than an hour.  Chronic rhinitis: started in childhood Symptoms include:  hives during the spring. Thinks secondary to grass or mushrooms. Sinus pain, eye redness Occurs year-round with seasonal flares Treatments tried: zyrtec daily Previous allergy testing:  yes Previous sinus, ear, tonsil, adenoid surgeries: adenoidectomy and tonsillectomy  Atopic Dermatitis: and other rashes: Diagnosed at age 68 or 22 years old, flares mostly eyelids and nape of neck, creases of elbows and knees, occasionally arm pits. Previous therapies tried topical steroids, has been treated with IM steroids for eyelid rash Eyelid rash is a raised red, not quite scaly, has big bumps within. No blisters. Stays for weeks at a time. Feels it is stress related. Current regimen: clobetasol foam, has no topicals for her face   Reports using of fragrance/dye free products Sleep is not affected  Planning to see a pulmonary doctor in August.   Has not been formally diagnosed with asthma yet.   Does have family members with asthma. Question about whether or not myasthenia gravis. She does use albuterol PRN - inhaler and nebs Using every 1-2 weeks. She is in home health. She did have a cold last year that turned bad and she needed steroids and antibiotics for 3 weeks as well as nebs. She is taking singulair daily.  Chart review: Previous patient of our clinic.  Last visit 2019 with Dr. Dellis Anes.  Has history of seasonal allergic rhinitis due to pollen, alpha gal sensitivity. At that visit she was continued on azelastine nasal spray, Xyzal and provided with a prednisone pack. 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  Last alpha gal testing was March 2018, Positive at 1.00, negative to beef, pork and lamb. Last environmental allergy testing same time and positive to grasses and cockroach. PCP visit 05/08/22: "She is interested in seeing Allergist right now. Concern for allergies with food. Has eyes, nose and throat irritation. Having  reaction to pollen as well. "  Other allergy screening: Medication allergy:  meds with blue dye cause her throat to tingle, propofal due to mitochondrial disorder Latex-contact dermatitis Hymenoptera allergy:  significant swelling,  dizziness, headache, vomiting, doesn't remember the bee sting. This was a few years ago. Recently stung by a bee and only had swelling History of recurrent infections suggestive of immunodeficency: yes Vaccinations are up to date.   [email or web address]-wold like CAMP information regarding dispersed blue.   Past Medical History: Past Medical History:  Diagnosis Date   Allergy to alpha-gal    elevated IgE 04/10/16   Autonomic dysfunction    Chiari I malformation (HCC)    Complication of anesthesia    slow to wake up   Dyspnea    Eczema    GERD (gastroesophageal reflux disease)    Headache    Heart murmur    Hypoglycemia    Hypothyroid    IBS (irritable bowel syndrome)    Joint pain    Mitochondrial disease (HCC)    Muscle pain    Pneumonia    several times   PONV (postoperative nausea and vomiting)    Medication List:  Current Outpatient Medications  Medication Sig Dispense Refill   ascorbic acid (VITAMIN C) 500 MG tablet Take 500 mg by mouth daily.     azelastine (ASTELIN) 0.1 % nasal spray Place 2 sprays into both nostrils 2 (two) times daily. Use in each nostril as directed 30 mL 5   budesonide (PULMICORT) 0.5 MG/2ML nebulizer solution Start at onset of respiratory illness-use 1 vial twice daily inhaled via nebulizer for 2 weeks or until symptoms resolve. 2 mL 5   clobetasol (OLUX) 0.05 % topical foam Apply topically 2 (two) times daily. Apply to affected areas twice daily as needed taking care to avoid axillae and groin area. 45 g 3   Coenzyme Q-10 200 MG CAPS Take 1 capsule by mouth daily.     Continuous Blood Gluc Receiver (DEXCOM G6 RECEIVER) DEVI 1 Device by Does not apply route as directed. 1 each 2   Continuous Blood Gluc Sensor (DEXCOM G6 SENSOR) MISC APPLY 1 SENSOR INTO THE SKIN AS DIRECTED(CHANGE SENSOR EVERY 10 DAYS) 3 each 11   Continuous Blood Gluc Transmit (DEXCOM G6 TRANSMITTER) MISC INJECT 1 DEVICE INTO THE SKIN AS DIRECTED (REUSE UP TO 8 TIMES WITH EACH NEW  SENSOR) 1 each 3   Crisaborole (EUCRISA) 2 % OINT Apply 1 Application topically 2 (two) times daily as needed. Good option for face. 100 g 3   cromolyn (GASTROCROM) 100 MG/5ML solution Take 10 mLs (200 mg total) by mouth 4 (four) times daily -  before meals and at bedtime. 480 mL 5   fluticasone (FLONASE) 50 MCG/ACT nasal spray Place 1 spray into both nostrils daily. 16 g 4   Glucagon (BAQSIMI TWO PACK) 3 MG/DOSE POWD Place 1 spray into the nose as directed. 2 each 3   hydrocortisone 2.5 % ointment Apply topically twice daily as need to red sandpapery rash. 30 g 3   leucovorin (WELLCOVORIN) 25 MG tablet TAKE 1 TABLET BY MOUTH 2 TIMES A DAY 180 tablet 1   levOCARNitine (CARNITOR) 1 GM/10ML solution TAKE 2 TEASPOONFUL ( ) BY MOUTH TWICEDAILY 118 mL 12   lidocaine (XYLOCAINE) 5 % ointment Apply 1 application topically as needed. 35.44 g 3   metaxalone (SKELAXIN) 800 MG tablet TAKE 1/2 TABLET (400MG  TOTAL) BY MOUTH DAILY AS NEEDED (MUSCLE PAIN) 30 tablet 3   Nutritional Supplements (NUTRITIONAL  SUPPLEMENT PLUS) LIQD 3 cartons (711 mL) of Ensure Clear given via gtube daily. 16109 mL 12   Nutritional Supplements (NUTRITIONAL SUPPLEMENT PLUS) LIQD 237-474 mL (1-2 cartons) Boost Breeze (peach flavored only) given po daily. 14694 mL 12   Nutritional Supplements (NUTRITIONAL SUPPLEMENT PLUS) LIQD 30 mL Prostat mixed with 30-60 mL water flush given via gtube daily. 930 mL 12   Olopatadine HCl (PATADAY) 0.2 % SOLN Place 1 drop into both eyes daily as needed. 2.5 mL 5   ondansetron (ZOFRAN-ODT) 4 MG disintegrating tablet Take 1 tablet under the tongue every 6-8 hours as needed for nausea. 30 tablet 5   polyethylene glycol powder (GLYCOLAX/MIRALAX) 17 GM/SCOOP powder 17 g or one capful in G-Tube. 255 g 3   pregabalin (LYRICA) 25 MG capsule Take 1 capsule (25 mg total) by mouth 2 (two) times daily. 60 capsule 5   pregabalin (LYRICA) 75 MG capsule Take 2 capsules (150 mg total) by mouth at bedtime. 60 capsule 3    promethazine (PHENERGAN) 25 MG tablet Take 1 tablet at onset of migraine. May repeat every 6 hours PRN 30 tablet 0   pyridostigmine (MESTINON) 60 MG tablet Take 1 tablet (60 mg total) by mouth in the morning, at noon, in the evening, and at bedtime. 120 tablet 5   RABEprazole (ACIPHEX) 20 MG tablet TAKE 1 TABLET BY MOUTH 2 TIMES A DAY 60 tablet 3   Rimegepant Sulfate (NURTEC) 75 MG TBDP Take 1 tablet (75 mg total) by mouth as needed (at onset of migraine). 30 tablet 3   rizatriptan (MAXALT) 10 MG tablet TAKE 1 TABLET BY MOUTH AS NEEDED FOR MIGRAINE. MAY REPEAT IN 2 HOURS IF NEEDED. 12 tablet 3   SLYND 4 MG TABS Take 1 tablet by mouth daily.     SYNTHROID 88 MCG tablet TAKE 1 TABLET BY MOUTH EVERY MORNING ON AN EMPTY STOMACH 90 tablet 3   topiramate (TOPAMAX) 25 MG tablet Take 1 tablet (25 mg total) by mouth daily after breakfast. 30 tablet 5   Topiramate ER (TROKENDI XR) 50 MG CP24 TAKE 1 CAPSULE BY MOUTH DAILY AT BEDTIME 30 capsule 5   traZODone (DESYREL) 50 MG tablet TAKE 1/2 TABLET BY MOUTH 30 MINUTES PRIOR TO BEDTIME. IF NEEDED, MAY INCREASE TO 1 TABLET BEFORE BEDTIME 30 tablet 1   vitamin E 200 UNIT capsule Take 200 Units by mouth daily.     albuterol (ACCUNEB) 0.63 MG/3ML nebulizer solution Take 3ml by nebulizer 3 times per day as needed for shortness of breath 270 mL 5   albuterol (PROVENTIL HFA) 108 (90 Base) MCG/ACT inhaler Inhale 1-2 puffs into the lungs every 4 (four) hours as needed for wheezing or shortness of breath. 18 g 0   cetirizine (ZYRTEC) 10 MG tablet Take 1 tablet (10 mg total) by mouth in the morning and at bedtime. 60 tablet 3   EPINEPHrine 0.3 mg/0.3 mL IJ SOAJ injection Inject 0.3 mg into the muscle as needed for anaphylaxis. As needed 4 each 1   montelukast (SINGULAIR) 10 MG tablet Take 1 tablet (10 mg total) by mouth at bedtime. 30 tablet 5   No current facility-administered medications for this visit.   Known Allergies:  Allergies  Allergen Reactions   Other  Anaphylaxis    Alpha Gal allergy \ Red meat\ any hoof animal   Latex Rash   Lactated Ringers     Contraindicated w/ mitochondrial syndrome   Propofol Other (See Comments)    Due to possible mitochondrial disorder  Due to possible mitochondrial disorder Due to possible mitochondrial disorder   Tape Itching and Other (See Comments)   Past Surgical History: Past Surgical History:  Procedure Laterality Date   ADENOIDECTOMY     2004or 2005   chiari decompression  2007   GALLBLADDER SURGERY  2015   GASTROSTOMY TUBE PLACEMENT     x 2   IR CV LINE INJECTION  07/06/2020   MUSCLE BIOPSY  2010   Port Replacement  05/2018   x 2 orignal 08/2016 then replacement 05/2018   PORT-A-CATH REMOVAL N/A 03/24/2019   Procedure: PORT-A-CATH REMOVAL;  Surgeon: Rodman Pickle, MD;  Location: [facility] OR;  Service: General;  Laterality: N/A;   PORTACATH PLACEMENT Right 03/24/2019   Procedure: PORT-A-CATH INSERTION under ultrasound and fluoroscopic guidance;  Surgeon: Rodman Pickle, MD;  Location: [facility] OR;  Service: General;  Laterality: Right;   REMOVAL OF GASTROSTOMY TUBE N/A 03/24/2019   Procedure: Exchange of Gastrostomy Tube;  Surgeon: Rodman Pickle, MD;  Location: [facility] OR;  Service: General;  Laterality: N/A;   tethered cord  07/2006   TONSILLECTOMY     [id number]   Family History: Family History  Problem Relation Age of Onset   Asthma Mother    Allergic rhinitis Mother    Heart murmur Mother    Allergic rhinitis Sister    Thyroid disease Sister    Allergic rhinitis Brother    Asthma Brother    Seizures Brother    Mitochondrial disorder Brother    Allergic rhinitis Maternal Grandmother    Asthma Maternal Grandmother    Diabetes type II Maternal Grandmother    Lung cancer Maternal Grandfather    Angioedema Neg Hx    Eczema Neg Hx    Immunodeficiency Neg Hx    Urticaria Neg Hx    Social History: Laura Parrish lives in a house built 20 years ago, 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  She is a recent graduate who will be returning to work or school in 2024-2025.  + HEPA filter in the home.  [place]   ROS:  All other systems negative except as noted per HPI.  Objective:  Blood pressure 118/76, pulse 78, temperature 97.9 F (36.6 C), temperature source Temporal, resp. rate 18, height 5' 4.25" (1.632 m), weight 122 lb 3.2 oz (55.4 kg), SpO2 100 %. Body mass index is 20.81 kg/m. Physical Exam:  General Appearance:  Alert, cooperative, no distress, appears stated age  Head:  Normocephalic, without obvious abnormality, atraumatic  Eyes:  Conjunctiva clear, EOM's intact  Nose: Nares normal, hypertrophic turbinates and normal mucosa  Throat: Lips, tongue normal; teeth and gums normal, normal posterior oropharynx  Neck: Supple, symmetrical  Lungs:   clear to auscultation bilaterally, Respirations unlabored, no coughing  Heart:  regular rate and rhythm and no murmur, Appears well perfused  Extremities: No edema  Skin: Skin color, texture, turgor normal and no rashes or lesions on visualized portions of skin  Neurologic: No gross deficits     Diagnostics: Spirometry:  Tracings reviewed. Her effort: Good reproducible efforts. FVC: 3.24L  FEV1: 2.80L, 84% predicted  FEV1/FVC ratio: 0.86  Interpretation: Spirometry consistent with normal pattern   Labs-see below  Assessment and Plan  Allergic reactions vs mast cell activation syndrome vs mastocytosis vs chronic urticaria  - increase zyrtec to 10 mg twice daily - continue singulair (montelukast)  10 mg nightly  - add cromolyn 200 mg (10 mL) three times daily before meals - tryptase level today; may consider further labs pending these results  Chronic Idiopathic Urticaria: - this is defined as hives lasting more than 6 weeks without an identifiable trigger - hives  can be from a number of different sources including infections, allergies, vibration, temperature, pressure among many others other possible causes - often an identifiable cause is not determined - some potential triggers include: stress, illness, NSAIDs, aspirin, hormonal changes - labs for other causes of chronic urticaria:thryoid labs and CU index-often elevated in autoimmune hives". - if symptoms are still uncontrolled with the above regimen, please arrange an appointment for discussion of Xolair (omalizumab)- an injectable medication for hives  Prolonged respiratory illnesses:  Breathing test today looked great - During respiratory flares: add budesonide 0.5 mg (1 vial) via nebulizer twice daily for 2 weeks or until respiratory symptoms resolve - Rescue Inhaler: albuterol-1 vial via nebulizer or 2-4 puffs via inhaler. Use  every 4-6 hours as needed for chest tightness, wheezing, or coughing.  Can also use 15 minutes prior to exercise if you have symptoms with activity. - Asthma is not controlled if:  - Symptoms are occurring >2 times a week OR  - >2 times a month nighttime awakenings  - You are requiring systemic steroids (prednisone/steroid injections) more than once per year  - Your require hospitalization for your asthma.  - Please call the clinic to schedule a follow up if these symptoms arise  Mammalian Meat Allergy (galactose-alpha-1, 3-galactose allergy AKA Alpha Gal): - Strictly avoid all mammalian meat (beef, pork, venison, lamb, goat, and bison, etc) - You may continue to eat all poultry and seafood - Always carry your EpiPen with you at all times to be used in case of severe reaction - updating testing today  Recurrent infections:  - likely related to underlying neuological disease - however, will screen humoral immune system to ensure intact (CBC-blood counts, antibody levels, response to specific previous vaccines)  Atopic Dermatitis:  Daily Care For Maintenance (daily  and continue even once eczema controlled) - Use hypoallergenic hydrating ointment at least twice daily.  This must be done daily for control of flares. (Great options include Vaseline, CeraVe, Aquaphor, Aveeno, Cetaphil, VaniCream, etc) - Avoid detergents, soaps or lotions with fragrances/dyes - Limit showers/baths to 5 minutes and use luke warm water instead of hot, pat dry following baths, and apply moisturizer - can use steroid/non-steroid therapy creams as detailed below up to twice weekly for prevention of flares.  For Flares:(add this to maintenance therapy if needed for flares) First apply steroid/non-steroid treatment creams. Wait 5 minutes then apply moisturizer.  - clobetasol foam % to body for moderate/severe flares-apply topically twice daily to red, raised areas of skin, followed by moisturizer. Do NOT use on face, groin or armpits. For Face: - Hydrocortisone 2.5% to face/body-apply topically twice daily to red, raised areas of skin, followed by moisturizer - Non-steroid treatment options:  Eucrisa 2% apply topically twice daily as needed (can use in place of steroid creams if desires)  Contact dermatitis to disperse blue and latex:  - strictly avoid both of these  - will send CAMP (safe product list) to email provided  Concern For Stinging Insect Allergy: - based on today's history, will obtain labs for stinging insects - if negative, would consider confirming with skin testing - please carry Epinephrine autoinjector at all times in case of accidental sting, to be used if  stung and has SKIN AND ANY OTHER SYMPTOMS - for SKIN only, can take Benadryl 2 tsp (25 mg)  - recommend medical alert bracelet - practice avoidance measures as outlined below when possible    Chronic Rhinitis: allergic - allergy testing today: labs obtained, will call with results Previous testing positive to pollens. - Prevention:  - will discuss pending lab results - Symptom control: - Consider  Flonase (fluticasone),) 1- 2 sprays in each nostril daily.  - Continue Singulair (Montelukast) 10mg  nightly.   - Discontinue if nightmares of behavior changes. - Increase, Zyrtec to 10 mg twice daily (as above in allergic reactions)  Allergic Conjunctivitis:  - Consider Allergy Eye drops-great options include Pataday (Olopatadine) or Zaditor (ketotifen) for eye symptoms daily as needed-both sold over the counter if not covered by insurance.  -Avoid eye drops that say red eye relief as they may contain medications that dry out your eyes.  Follow up : 3 months, sooner if needed It was a pleasure meeting you in clinic today! Thank you for allowing me to participate in your care.  This note in its entirety was forwarded to the Provider who requested this consultation.  Thank you for your kind referral. I appreciate the opportunity to take part in Cherron's care. Please do not hesitate to contact me with questions.  Sincerely,  Tonny Bollman, MD [facility]

## 2022-06-16 ENCOUNTER — Other Ambulatory Visit: Payer: Self-pay

## 2022-06-16 ENCOUNTER — Ambulatory Visit (INDEPENDENT_AMBULATORY_CARE_PROVIDER_SITE_OTHER): Payer: PRIVATE HEALTH INSURANCE | Admitting: Internal Medicine

## 2022-06-16 ENCOUNTER — Encounter: Payer: Self-pay | Admitting: Internal Medicine

## 2022-06-16 VITALS — BP 118/76 | HR 78 | Temp 97.9°F | Resp 18 | Ht 64.25 in | Wt 122.2 lb

## 2022-06-16 DIAGNOSIS — J310 Chronic rhinitis: Secondary | ICD-10-CM | POA: Diagnosis not present

## 2022-06-16 DIAGNOSIS — R0602 Shortness of breath: Secondary | ICD-10-CM | POA: Diagnosis not present

## 2022-06-16 DIAGNOSIS — B999 Unspecified infectious disease: Secondary | ICD-10-CM

## 2022-06-16 DIAGNOSIS — T63481A Toxic effect of venom of other arthropod, accidental (unintentional), initial encounter: Secondary | ICD-10-CM

## 2022-06-16 DIAGNOSIS — T7800XD Anaphylactic reaction due to unspecified food, subsequent encounter: Secondary | ICD-10-CM

## 2022-06-16 DIAGNOSIS — L2082 Flexural eczema: Secondary | ICD-10-CM

## 2022-06-16 DIAGNOSIS — J4530 Mild persistent asthma, uncomplicated: Secondary | ICD-10-CM

## 2022-06-16 DIAGNOSIS — L508 Other urticaria: Secondary | ICD-10-CM | POA: Diagnosis not present

## 2022-06-16 DIAGNOSIS — H1013 Acute atopic conjunctivitis, bilateral: Secondary | ICD-10-CM

## 2022-06-16 DIAGNOSIS — T7800XA Anaphylactic reaction due to unspecified food, initial encounter: Secondary | ICD-10-CM

## 2022-06-16 DIAGNOSIS — L253 Unspecified contact dermatitis due to other chemical products: Secondary | ICD-10-CM

## 2022-06-16 DIAGNOSIS — Z91038 Other insect allergy status: Secondary | ICD-10-CM

## 2022-06-16 DIAGNOSIS — E8840 Mitochondrial metabolism disorder, unspecified: Secondary | ICD-10-CM

## 2022-06-16 MED ORDER — EUCRISA 2 % EX OINT: 1.0000 | TOPICAL_OINTMENT | Freq: Two times a day (BID) | CUTANEOUS | 3 refills | Status: DC | PRN

## 2022-06-16 MED ORDER — MONTELUKAST SODIUM 10 MG PO TABS
10.0000 mg | ORAL_TABLET | Freq: Every day | ORAL | 5 refills | Status: DC
Start: 2022-06-16 — End: 2022-11-19

## 2022-06-16 MED ORDER — HYDROCORTISONE 2.5 % EX OINT: TOPICAL_OINTMENT | CUTANEOUS | 3 refills | Status: AC

## 2022-06-16 MED ORDER — CROMOLYN SODIUM 100 MG/5ML PO CONC: 200.0000 mg | Freq: Three times a day (TID) | ORAL | 5 refills | Status: DC

## 2022-06-16 MED ORDER — EPINEPHRINE 0.3 MG/0.3ML IJ SOAJ: 0.3000 mg | INTRAMUSCULAR | 1 refills | Status: AC | PRN

## 2022-06-16 MED ORDER — BUDESONIDE 0.5 MG/2ML IN SUSP: RESPIRATORY_TRACT | 5 refills | Status: AC

## 2022-06-16 MED ORDER — OLOPATADINE HCL 0.2 % OP SOLN: 1.0000 [drp] | Freq: Every day | OPHTHALMIC | 5 refills | Status: DC | PRN

## 2022-06-16 MED ORDER — CETIRIZINE HCL 10 MG PO TABS: 10.0000 mg | ORAL_TABLET | Freq: Two times a day (BID) | ORAL | 3 refills | Status: DC

## 2022-06-16 MED ORDER — ALBUTEROL SULFATE 0.63 MG/3ML IN NEBU
INHALATION_SOLUTION | RESPIRATORY_TRACT | 5 refills | Status: AC
Start: 2022-06-16 — End: ?

## 2022-06-16 MED ORDER — FLUTICASONE PROPIONATE 50 MCG/ACT NA SUSP: 1.0000 | Freq: Every day | NASAL | 4 refills | Status: DC

## 2022-06-16 NOTE — Patient Instructions (Addendum)
Allergic reactions vs mast cell activation syndrome vs mastocytosis vs chronic urticaria  - increase zyrtec to 10 mg twice daily - continue singulair (montelukast) 10 mg nightly  - add cromolyn 200 mg (10 mL) three times daily before meals - tryptase level today; may consider further labs pending these results  Chronic Idiopathic Urticaria: - this is defined as hives lasting more than 6 weeks without an identifiable trigger - hives can be from a number of different sources including infections, allergies, vibration, temperature, pressure among many others other possible causes - often an identifiable cause is not determined - some potential triggers include: stress, illness, NSAIDs, aspirin, hormonal changes - labs for other causes of chronic urticaria:thryoid labs and CU index-often elevated in autoimmune hives". - if symptoms are still uncontrolled with the above regimen, please arrange an appointment for discussion of Xolair (omalizumab)- an injectable medication for hives  Prolonged respiratory illnesses:  Breathing test today looked great - During respiratory flares: add budesonide 0.5 mg (1 vial) via nebulizer twice daily for 2 weeks or until respiratory symptoms resolve - Rescue Inhaler: albuterol-1 vial via nebulizer or 2-4 puffs via inhaler. Use  every 4-6 hours as needed for chest tightness, wheezing, or coughing.  Can also use 15 minutes prior to exercise if you have symptoms with activity. - Asthma is not controlled if:  - Symptoms are occurring >2 times a week OR  - >2 times a month nighttime awakenings  - You are requiring systemic steroids (prednisone/steroid injections) more than once per year  - Your require hospitalization for your asthma.  - Please call the clinic to schedule a follow up if these symptoms arise  Mammalian Meat Allergy (galactose-alpha-1, 3-galactose allergy AKA Alpha Gal): - Strictly avoid all mammalian meat (beef, pork, venison, lamb, goat, and bison,  etc) - You may continue to eat all poultry and seafood - Always carry your EpiPen with you at all times to be used in case of severe reaction - updating testing today  Recurrent infections:  - likely related to underlying neurological disease - however, will screen humoral immune system to ensure intact (CBC-blood counts, antibody levels, response to specific previous vaccines)  Atopic Dermatitis:  Daily Care For Maintenance (daily and continue even once eczema controlled) - Use hypoallergenic hydrating ointment at least twice daily.  This must be done daily for control of flares. (Great options include Vaseline, CeraVe, Aquaphor, Aveeno, Cetaphil, VaniCream, etc) - Avoid detergents, soaps or lotions with fragrances/dyes - Limit showers/baths to 5 minutes and use luke warm water instead of hot, pat dry following baths, and apply moisturizer - can use steroid/non-steroid therapy creams as detailed below up to twice weekly for prevention of flares.  For Flares:(add this to maintenance therapy if needed for flares) First apply steroid/non-steroid treatment creams. Wait 5 minutes then apply moisturizer.  - clobetasol foam % to body for moderate/severe flares-apply topically twice daily to red, raised areas of skin, followed by moisturizer. Do NOT use on face, groin or armpits. For Face: - Hydrocortisone 2.5% to face/body-apply topically twice daily to red, raised areas of skin, followed by moisturizer - Non-steroid treatment options:  Eucrisa 2% apply topically twice daily as needed (can use in place of steroid creams if desires)  Contact dermatitis to disperse blue and latex:  - strictly avoid both of these  - will send CAMP (safe product list) to email provided  Concern For Stinging Insect Allergy: - based on today's history, will obtain labs for stinging insects - if  negative, would consider confirming with skin testing - please carry Epinephrine autoinjector at all times in case of  accidental sting, to be used if stung and has SKIN AND ANY OTHER SYMPTOMS - for SKIN only, can take Benadryl 2 tsp (25 mg)  - recommend medical alert bracelet - practice avoidance measures as outlined below when possible    Chronic Rhinitis: allergic - allergy testing today: labs obtained, will call with results Previous testing positive to pollens. - Prevention:  - will discuss pending lab results - Symptom control: - Consider Flonase (fluticasone),) 1- 2 sprays in each nostril daily.  - Continue Singulair (Montelukast) 10mg  nightly.   - Discontinue if nightmares of behavior changes. - Increase, Zyrtec to 10 mg twice daily (as above in allergic reactions)  Allergic Conjunctivitis:  - Consider Allergy Eye drops-great options include Pataday (Olopatadine) or Zaditor (ketotifen) for eye symptoms daily as needed-both sold over the counter if not covered by insurance.  -Avoid eye drops that say red eye relief as they may contain medications that dry out your eyes.  Follow up : 3 months, sooner if needed It was a pleasure meeting you in clinic today! Thank you for allowing me to participate in your care.  Tonny Bollman, MD [facility]

## 2022-06-17 LAB — IGE+ALLERGENS ZONE 2(30)

## 2022-06-17 LAB — ALLERGEN HYMENOPTERA PANEL

## 2022-06-17 LAB — CHRONIC URTICARIA

## 2022-06-17 LAB — THYROID PANEL WITH TSH: Free Thyroxine Index: 2.4 (ref 1.2–4.9)

## 2022-06-17 LAB — CBC WITH DIFFERENTIAL
Immature Granulocytes: 0 %
Neutrophils Absolute: 3.2 10*3/uL (ref 1.4–7.0)
RDW: 12.7 % (ref 11.7–15.4)

## 2022-06-17 LAB — STREP PNEUMONIAE 23 SEROTYPES IGG

## 2022-06-18 LAB — IGE+ALLERGENS ZONE 2(30)

## 2022-06-18 LAB — DIPHTHERIA / TETANUS ANTIBODY PANEL

## 2022-06-18 LAB — CBC WITH DIFFERENTIAL
Basophils Absolute: 0.1 10*3/uL (ref 0.0–0.2)
EOS (ABSOLUTE): 0.3 10*3/uL (ref 0.0–0.4)
Lymphocytes Absolute: 3.1 10*3/uL (ref 0.7–3.1)

## 2022-06-18 LAB — STREP PNEUMONIAE 23 SEROTYPES IGG

## 2022-06-18 LAB — TRYPTASE: Tryptase: 4.2 ug/L (ref 2.2–13.2)

## 2022-06-18 LAB — ALLERGEN HYMENOPTERA PANEL

## 2022-06-19 LAB — STREP PNEUMONIAE 23 SEROTYPES IGG

## 2022-06-19 LAB — IGE+ALLERGENS ZONE 2(30)
Aspergillus Fumigatus IgE: <0.1 kU/L
Common Silver Birch IgE: <0.1 kU/L
Penicillium Chrysogen IgE: <0.1 kU/L

## 2022-06-19 LAB — CBC WITH DIFFERENTIAL
Basos: 1 %
Eos: 4 %
Hemoglobin: 14.6 g/dL (ref 11.1–15.9)
Monocytes Absolute: 0.5 10*3/uL (ref 0.1–0.9)
Monocytes: 7 %
Neutrophils: 44 %

## 2022-06-19 LAB — THYROID PANEL WITH TSH
T3 Uptake Ratio: 27 % (ref 24–39)
T4, Total: 8.8 ug/dL (ref 4.5–12.0)

## 2022-06-19 LAB — ALLERGEN HYMENOPTERA PANEL

## 2022-06-20 LAB — IGE+ALLERGENS ZONE 2(30)
Amer Sycamore IgE Qn: <0.1 kU/L
Dog Dander IgE: <0.1 kU/L
Nettle IgE: <0.1 kU/L
Oak, White IgE: <0.1 kU/L
Plantain, English IgE: <0.1 kU/L
Ragweed, Short IgE: <0.1 kU/L
Timothy Grass IgE: <0.1 kU/L

## 2022-06-20 LAB — ALPHA-GAL PANEL: Allergen Lamb IgE: <0.1 kU/L

## 2022-06-20 LAB — STREP PNEUMONIAE 23 SEROTYPES IGG

## 2022-06-20 LAB — ALLERGEN HYMENOPTERA PANEL: Paper Wasp IgE: <0.1 kU/L

## 2022-06-20 LAB — CBC WITH DIFFERENTIAL
Hematocrit: 43.9 % (ref 34.0–46.6)
MCHC: 33.3 g/dL (ref 31.5–35.7)

## 2022-06-20 LAB — IGG, IGA, IGM: IgG (Immunoglobin G), Serum: 1190 mg/dL (ref 586–1602)

## 2022-06-25 LAB — CBC WITH DIFFERENTIAL
Immature Grans (Abs): 0 10*3/uL (ref 0.0–0.1)
Lymphs: 44 %
MCH: 28.5 pg (ref 26.6–33.0)
MCV: 86 fL (ref 79–97)
RBC: 5.13 x10E6/uL (ref 3.77–5.28)
WBC: 7.2 10*3/uL (ref 3.4–10.8)

## 2022-06-25 LAB — STREP PNEUMONIAE 23 SEROTYPES IGG
Pneumo Ab Type 1*: 1.4 ug/mL (ref >1.3)
Pneumo Ab Type 12 (12F)*: 0.3 ug/mL — ABNORMAL LOW (ref >1.3)
Pneumo Ab Type 19 (19F)*: 1.8 ug/mL (ref >1.3)
Pneumo Ab Type 20*: 6.9 ug/mL (ref >1.3)
Pneumo Ab Type 23 (23F)*: 28.3 ug/mL (ref >1.3)
Pneumo Ab Type 26 (6B)*: 0.9 ug/mL — ABNORMAL LOW (ref >1.3)
Pneumo Ab Type 43 (11A)*: 1 ug/mL — ABNORMAL LOW (ref >1.3)
Pneumo Ab Type 5*: 10.5 ug/mL (ref >1.3)
Pneumo Ab Type 54 (15B)*: 8.8 ug/mL (ref >1.3)
Pneumo Ab Type 57 (19A)*: 25.7 ug/mL (ref >1.3)
Pneumo Ab Type 9 (9N)*: 1.6 ug/mL (ref >1.3)

## 2022-06-25 LAB — ALLERGEN HYMENOPTERA PANEL: Honeybee IgE: <0.1 kU/L

## 2022-06-25 LAB — ALPHA-GAL PANEL
Beef IgE: <0.1 kU/L
O215-IgE Alpha-Gal: <0.1 kU/L
Pork IgE: <0.1 kU/L

## 2022-06-25 LAB — DIPHTHERIA / TETANUS ANTIBODY PANEL: Tetanus Ab, IgG: 2.84 IU/mL (ref <0.10)

## 2022-06-25 LAB — THYROID PANEL WITH TSH: TSH: 3.7 u[IU]/mL (ref 0.450–4.500)

## 2022-06-25 LAB — IGE+ALLERGENS ZONE 2(30)
Bermuda Grass IgE: <0.1 kU/L
Cedar, Mountain IgE: <0.1 kU/L
Hickory, White IgE: <0.1 kU/L
Johnson Grass IgE: <0.1 kU/L
Maple/Box Elder IgE: <0.1 kU/L
Mugwort IgE Qn: <0.1 kU/L
Sheep Sorrel IgE Qn: <0.1 kU/L

## 2022-06-25 LAB — IGG, IGA, IGM
IgA/Immunoglobulin A, Serum: 90 mg/dL (ref 87–352)
IgM (Immunoglobulin M), Srm: 158 mg/dL (ref 26–217)

## 2022-06-25 NOTE — Progress Notes (Signed)
Please let Laura Parrish's family know that her lab results have returned.   Her immune evaluation looks very reassuring. She does have adequate number of antibodies, blood counts and protection against childhood vaccines including diphteria and tetanus as well as strep pneumonia.   Her tryptase level is normal, making mast cell disease less likely. However, would still try the medications we discussed in clinic to see if she gets any clinical benefit. Her allergy testing to bee venoms was negative, as was her environmental panel and alpha-gal panel.  However, since she feels symptomatic when eating mammalian meats, would recommend ongoing avoidance.  We can always consider reintroducing in the future, but would not try now given recent symptoms after exposure. Her thyroid levels are normal and she does not have the elevated marker for autoimmune hives.   Lets get her scheduled for skin testing for environmental allergies and beef, pork and lamb to confirm these results. I would still recommend the treatment plan we previously discussed.

## 2022-07-31 ENCOUNTER — Encounter (INDEPENDENT_AMBULATORY_CARE_PROVIDER_SITE_OTHER): Payer: Self-pay

## 2022-09-17 ENCOUNTER — Ambulatory Visit: Payer: PRIVATE HEALTH INSURANCE | Admitting: Internal Medicine

## 2022-09-19 ENCOUNTER — Ambulatory Visit (INDEPENDENT_AMBULATORY_CARE_PROVIDER_SITE_OTHER): Payer: PRIVATE HEALTH INSURANCE | Admitting: Pediatrics

## 2022-09-19 ENCOUNTER — Encounter (INDEPENDENT_AMBULATORY_CARE_PROVIDER_SITE_OTHER): Payer: Self-pay | Admitting: Pediatrics

## 2022-09-19 VITALS — BP 110/58 | HR 80 | Resp 22 | Ht 64.76 in | Wt 122.8 lb

## 2022-09-19 DIAGNOSIS — R071 Chest pain on breathing: Secondary | ICD-10-CM

## 2022-09-19 DIAGNOSIS — J4520 Mild intermittent asthma, uncomplicated: Secondary | ICD-10-CM

## 2022-09-19 DIAGNOSIS — J309 Allergic rhinitis, unspecified: Secondary | ICD-10-CM | POA: Insufficient documentation

## 2022-09-19 DIAGNOSIS — Z23 Encounter for immunization: Secondary | ICD-10-CM

## 2022-09-19 DIAGNOSIS — G901 Familial dysautonomia [Riley-Day]: Secondary | ICD-10-CM

## 2022-09-19 DIAGNOSIS — R0602 Shortness of breath: Secondary | ICD-10-CM

## 2022-09-19 DIAGNOSIS — Z931 Gastrostomy status: Secondary | ICD-10-CM

## 2022-09-19 DIAGNOSIS — G7000 Myasthenia gravis without (acute) exacerbation: Secondary | ICD-10-CM

## 2022-09-19 DIAGNOSIS — R0600 Dyspnea, unspecified: Secondary | ICD-10-CM | POA: Diagnosis not present

## 2022-09-19 NOTE — Progress Notes (Signed)
Pediatric Pulmonology  Clinic Note  09/19/2022  Primary Care Physician: Rudene Christians, DO  Assessment and Plan:  Laura Parrish is a 22 y.o. female who was seen today for the following issues:  Asthma - intermittent: Neliah has fairly typical symptoms of asthma - and given suspected multiple allergic disorders likely that she has some allergic asthma physiology. However asthma symptoms are fairly mild and infrequent, and spirometry again does not show obstruction. Symptoms appear well controlled for now on Singulair (montelukast) and albuterol prn, and I agree with the plan for intermittent inhaled corticosteroids with viral respiratory tract infections. - Pulmicort (budesonide) 0.5mg  daily for 1-2 weeks at onset of viral respiratory tract infections  - Continue albuterol prn - Medications and treatments were reviewed - encouraged spacer use with mdi  - Asthma action plan provided.    Dyspnea: Laura Parrish has also had ongoing dyspnea - which does not seem all related to asthma. Has had extensive workup for muscular and neurologic disorders which has been inconclusive. Though I don't have a clear etiology for her dyspnea- no evidence of significant restrictive lung disease from muscular weakness given normal spirometry and no other evidence of significant lung disease besides asthma.    Healthcare Maintenance: - Symaria was given flu vaccine in clinic today  Followup: Return if symptoms worsen or fail to improve.Alvin does not need to schedule a return visit with Pediatric Pulmonology at this time. However, if her symptoms worsen in the future or you have further questions, we would be happy to discuss over the phone or see her back in clinic.   Discussed that her allergist should be able to manage her asthma - but that if respiratory symptoms worsen in the future I would be happy to refer her to an adult pulmonologist     Laura Parrish "Will" Damita Lack, MD [facility]  [facility]mon[facility] [place] Office: [phone] [facility] Office [phone]   Subjective:  Laura Parrish is a 22 y.o. female who is seen for followup of dyspnea.   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  She is followed by Dr. Artis Flock and [name] with the [facility]. 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    Vyonne was last seen by myself in clinic in 2021. At that time, she continued to have some dyspnea, but her polysomnography did not show any hypoventilation and no other evidence of clear muscular weakness or respiratory disease.   [name] has been seen by an allergist several times recently for multiple allergic conditions. Asthma symptoms seemed fairly well controlled - and they planned on using prn inhaled corticosteroid and albuterol prn.   Today Laura Parrish and her mother report that she overall has been doing fairly well from a respiratory standpoint recently. No significant respiratory illnesses or exacerbations over the past 6 months. Did have some increased symptoms with allergy season in the spring. Using albuterol rarely - less than 1x/ day. Mostly uses this when allergy symptoms are bad or when she has a viral respiratory tract infection. She uses nebulizer mostly- and doesn't use a spacer when she uses her inhaler.   no nighttime cough awakenings outside of illnesses and no significant cough during the day  She does have some persistent shortness  of breath - mostly with activity. Usually not coughing/ wheezing with this though - and albuterol doesn't help much.  Current is taking Singulair (montelukast),  Zyrtec (cetirizine), and pataday drops. These seem to control allergy symptoms fairly well. Trying to get cromolyn but is having trouble finding it. Discussing possible biologic therapy. Recent allergy testing was actually negative- but she is having more workup done soon. They are suspicious of a possible mast cell disorder.    Past Medical History:   Patient Active Problem List   Diagnosis Date Noted   Anaphylaxis due to hymenoptera venom 06/16/2022   Allergic conjunctivitis of both eyes 06/16/2022   Latex allergy, contact dermatitis 06/16/2022   Viral URI 02/14/2022   Abdominal pain 07/19/2021   Insomnia 05/31/2020   Dysautonomia (HCC) 12/02/2019   Myasthenia gravis (HCC) 12/02/2019   Healthcare maintenance 12/02/2019   Raynaud's phenomenon 12/01/2019   Menorrhagia 12/01/2019   Referred ear pain, bilateral 11/01/2019   Feeding by G-tube (HCC) 03/07/2019   Thyroiditis, autoimmune 02/06/2018   Goiter 02/06/2018   Weakness 02/06/2018   Hyperprolactinemia (HCC) 02/06/2018   Hyperhidrosis of palms and soles 02/06/2018   Dehydration 02/06/2018   At risk for dehydration 10/12/2017   Breakthrough bleeding on birth control pills 06/25/2017   Inadequate fluid intake 06/25/2017   Ehlers-Danlos syndrome 12/29/2016   Snoring 09/28/2016   Port-A-Cath in place 09/22/2016   Iron deficiency anemia 07/23/2016   Health care home, active care coordination 07/23/2016   Complex care coordination 07/23/2016   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   Chronic pain 05/22/2016   Clubfoot, 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   Paresthesia 05/22/2016   Premature birth 05/22/2016   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 1 night in NICU. Small amt of oxygen right after birth.  Hospitalizations: None   Medications:   Current Outpatient Medications:    cetirizine (ZYRTEC) 10 MG tablet, Take 1 tablet (10 mg total) by mouth in the morning and at bedtime., Disp: 60 tablet, Rfl: 3   clobetasol (OLUX) 0.05 % topical foam, Apply topically 2 (two) times daily. Apply to affected areas twice daily as needed taking care to avoid axillae and groin area., Disp: 45 g, Rfl: 3   Coenzyme Q-10 200 MG CAPS, Take 1 capsule by mouth daily., Disp: , Rfl:    hydrocortisone 2.5 % ointment, Apply topically twice daily as need to red sandpapery rash., Disp: 30 g, Rfl: 3   Iron-Vitamin C 65-125 MG TABS, Take 1 tablet by mouth daily., Disp: , Rfl:    leucovorin (WELLCOVORIN) 25 MG tablet, TAKE 1 TABLET BY MOUTH 2 TIMES A DAY, Disp: 180 tablet, Rfl: 1   levOCARNitine (CARNITOR) 1 GM/10ML solution, TAKE 2 TEASPOONFUL ( ) BY MOUTH TWICEDAILY, Disp: 118 mL, Rfl: 12   montelukast (SINGULAIR) 10 MG tablet, Take 1 tablet (10 mg total) by mouth at bedtime., Disp: 30 tablet, Rfl: 5   Nutritional Supplements (NUTRITIONAL SUPPLEMENT PLUS) LIQD, 3 cartons (711 mL) of Ensure Clear given via gtube  daily., Disp: 22041 mL, Rfl: 12   Nutritional Supplements (NUTRITIONAL SUPPLEMENT PLUS) LIQD, 30 mL Prostat mixed with 30-60 mL water flush given via gtube daily., Disp: 930 mL, Rfl: 12   Olopatadine HCl (PATADAY) 0.2 % SOLN, Place 1 drop into both eyes daily as needed., Disp: 2.5 mL, Rfl: 5   pregabalin (LYRICA) 25 MG capsule, Take 1 capsule (25 mg total) by mouth 2 (two) times daily., Disp: 60 capsule, Rfl: 5   pregabalin (LYRICA) 75 MG capsule, Take 2 capsules (150 mg total) by mouth at bedtime.,  Disp: 60 capsule, Rfl: 3   pyridostigmine (MESTINON) 60 MG tablet, Take 1 tablet (60 mg total) by mouth in the morning, at noon, in the evening, and at bedtime., Disp: 120 tablet, Rfl: 5   RABEprazole (ACIPHEX) 20 MG tablet, TAKE 1 TABLET BY MOUTH 2 TIMES A DAY, Disp: 60 tablet, Rfl: 3   SLYND 4 MG TABS, Take 1 tablet by mouth daily., Disp: , Rfl:    SYNTHROID 88 MCG tablet, TAKE 1 TABLET BY MOUTH EVERY MORNING ON AN EMPTY STOMACH, Disp: 90 tablet, Rfl: 3   topiramate (TOPAMAX) 25 MG tablet, Take 1 tablet (25 mg total) by mouth daily after breakfast., Disp: 30 tablet, Rfl: 5   Topiramate ER (TROKENDI XR) 50 MG CP24, TAKE 1 CAPSULE BY MOUTH DAILY AT BEDTIME, Disp: 30 capsule, Rfl: 5   traZODone (DESYREL) 50 MG tablet, TAKE 1/2 TABLET BY MOUTH 30 MINUTES PRIOR TO BEDTIME. IF NEEDED, MAY INCREASE TO 1 TABLET BEFORE BEDTIME, Disp: 30 tablet, Rfl: 1   vitamin E 200 UNIT capsule, Take 200 Units by mouth daily., Disp: , Rfl:    albuterol (ACCUNEB) 0.63 MG/3ML nebulizer solution, Take 3ml by nebulizer 3 times per day as needed for shortness of breath (Patient not taking: Reported on 09/19/2022), Disp: 270 mL, Rfl: 5   albuterol (PROVENTIL HFA) 108 (90 Base) MCG/ACT inhaler, Inhale 1-2 puffs into the lungs every 4 (four) hours as needed for wheezing or shortness of breath., Disp: 18 g, Rfl: 0   ascorbic acid (VITAMIN C) 500 MG tablet, Take 500 mg by mouth daily. (Patient not taking: Reported on 09/19/2022), Disp: , Rfl:    azelastine (ASTELIN) 0.1 % nasal spray, Place 2 sprays into both nostrils 2 (two) times daily. Use in each nostril as directed (Patient not taking: Reported on 09/19/2022), Disp: 30 mL, Rfl: 5   budesonide (PULMICORT) 0.5 MG/2ML nebulizer solution, Start at onset of respiratory illness-use 1 vial twice daily inhaled via nebulizer for 2 weeks or until symptoms resolve. (Patient not taking: Reported on 09/19/2022), Disp: 2 mL, Rfl: 5   Continuous Blood Gluc Receiver (DEXCOM G6 RECEIVER) DEVI, 1 Device  by Does not apply route as directed. (Patient not taking: Reported on 09/19/2022), Disp: 1 each, Rfl: 2   Continuous Blood Gluc Sensor (DEXCOM G6 SENSOR) MISC, APPLY 1 SENSOR INTO THE SKIN AS DIRECTED(CHANGE SENSOR EVERY 10 DAYS) (Patient not taking: Reported on 09/19/2022), Disp: 3 each, Rfl: 11   Continuous Blood Gluc Transmit (DEXCOM G6 TRANSMITTER) MISC, INJECT 1 DEVICE INTO THE SKIN AS DIRECTED (REUSE UP TO 8 TIMES WITH EACH NEW SENSOR) (Patient not taking: Reported on 09/19/2022), Disp: 1 each, Rfl: 3   Crisaborole (EUCRISA) 2 % OINT, Apply 1 Application topically 2 (two) times daily as needed. Good option for face. (Patient not taking: Reported on 09/19/2022), Disp: 100 g, Rfl: 3   cromolyn (GASTROCROM) 100 MG/5ML solution, Take 10 mLs (200 mg total) by mouth 4 (four)  times daily -  before meals and at bedtime. (Patient not taking: Reported on 09/19/2022), Disp: 480 mL, Rfl: 5   EPINEPHrine 0.3 mg/0.3 mL IJ SOAJ injection, Inject 0.3 mg into the muscle as needed for anaphylaxis. As needed (Patient not taking: Reported on 09/19/2022), Disp: 4 each, Rfl: 1   Glucagon (BAQSIMI TWO PACK) 3 MG/DOSE POWD, Place 1 spray into the nose as directed. (Patient not taking: Reported on 09/19/2022), Disp: 2 each, Rfl: 3   lidocaine (XYLOCAINE) 5 % ointment, Apply 1 application topically as needed. (Patient not taking: Reported on 09/19/2022), Disp: 35.44 g, Rfl: 3   metaxalone (SKELAXIN) 800 MG tablet, TAKE 1/2 TABLET (400MG  TOTAL) BY MOUTH DAILY AS NEEDED (MUSCLE PAIN) (Patient not taking: Reported on 09/19/2022), Disp: 30 tablet, Rfl: 3   Nutritional Supplements (NUTRITIONAL SUPPLEMENT PLUS) LIQD, 237-474 mL (1-2 cartons) Boost Breeze (peach flavored only) given po daily. (Patient not taking: Reported on 09/19/2022), Disp: 14694 mL, Rfl: 12   ondansetron (ZOFRAN-ODT) 4 MG disintegrating tablet, Take 1 tablet under the tongue every 6-8 hours as needed for nausea. (Patient not taking: Reported on 09/19/2022), Disp: 30 tablet, Rfl: 5    polyethylene glycol powder (GLYCOLAX/MIRALAX) 17 GM/SCOOP powder, 17 g or one capful in G-Tube. (Patient not taking: Reported on 09/19/2022), Disp: 255 g, Rfl: 3   promethazine (PHENERGAN) 25 MG tablet, Take 1 tablet at onset of migraine. May repeat every 6 hours PRN (Patient not taking: Reported on 09/19/2022), Disp: 30 tablet, Rfl: 0   Rimegepant Sulfate (NURTEC) 75 MG TBDP, Take 1 tablet (75 mg total) by mouth as needed (at onset of migraine). (Patient not taking: Reported on 09/19/2022), Disp: 30 tablet, Rfl: 3   rizatriptan (MAXALT) 10 MG tablet, TAKE 1 TABLET BY MOUTH AS NEEDED FOR MIGRAINE. MAY REPEAT IN 2 HOURS IF NEEDED. (Patient not taking: Reported on 09/19/2022), Disp: 12 tablet, Rfl: 3  Allergies:   Allergies  Allergen Reactions   Other Anaphylaxis    Alpha Gal allergy \ Red meat\ any hoof animal   Latex Rash   Lactated Ringers     Contraindicated w/ mitochondrial syndrome   Propofol Other (See Comments)    Due to possible mitochondrial disorder Due to possible mitochondrial disorder Due to possible mitochondrial disorder   Tape Itching and Other (See Comments)    Social History:   Social History   Social History Narrative   [place] graduated from [employer].    Graduated from [employer]. Currently taking break.   She enjoys reading, playing with her cat and her dog, and hanging out with her sister.      Lives at home in [place] [address]. No tobacco smoke or vaping exposure.  cat and dog. [place]   Objective:  Vitals Signs: BP (!) 110/58   Pulse 80   Resp (!) 22   Ht 5' 4.76" (1.645 m)   Wt 122 lb 12.8 oz (55.7 kg)   LMP 08/28/2022 (Approximate)   SpO2 100%   BMI 20.58 kg/m  Growth %ile SmartLinks can only be used for patients less than 71 years old. Wt Readings from Last 3 Encounters:  09/19/22 122 lb 12.8 oz (55.7 kg)  06/16/22 122 lb 3.2 oz (55.4 kg)  05/08/22 122 lb (55.3 kg)   Ht Readings from Last 3 Encounters:  09/19/22  5' 4.76" (1.645 m)  06/16/22 5' 4.25" (1.632 m)  05/08/22 5' 3.78" (1.62 m)   GENERAL: Appears comfortable and in no respiratory distress. RESPIRATORY:  No stridor or stertor. Clear to auscultation bilaterally, normal work and rate of breathing with no retractions, no crackles or wheezes, with symmetric breath sounds throughout.  No clubbing.  CARDIOVASCULAR:  Regular rate and rhythm without murmur.     Medical Decision Making:     In office spirometry today showed: % pred FVC 91% FEV1 91% FEV1/FVC: 101% FEV25-75: 98% Interpretation: spirometry is normal  polysomnography 01/20/19:  Impression  1. No evidence for clinically significant sleep disordered breathing.  Oxygen saturations and End tidal C02 were in normal range.  No evidence of hypoventilation.  Bicarb:  CO2  Date Value Ref Range Status  05/08/2022 19 (L) 20 - 32 mmol/L Final   Spirometry - normal spirometry lung volumes and DLCO - no bronchodilator response. MIP/MEP's low.    Echocardiogram 06/2022: Summary:   1. Normal left ventricular cavity size and systolic function.   2. Ejection fraction (m-mode) = 62.6 %.   3. Normal right ventricular cavity size and systolic function.   4. Right ventricular systolic pressure estimate = 24.3 mmHg.   5. All visualized structures appeared normal.

## 2022-09-19 NOTE — Patient Instructions (Addendum)
Pediatric Pulmonology  Clinic Discharge Instructions       09/19/22    It was great to see you both today!   We will continue the same plan for now - using the Pulmicort (budesonide) for 1-2 weeks at the start of cold virus symptoms, and albuterol as needed. I do recommend using a spacer anytime you use an inhaler. No other changes today. I think your allergy doctor should be fine to manage your asthma going forward, but I am happy to talk or refer you to an adult pulmonology doctor if symptoms worsen in the future.    Followup: Return if symptoms worsen or fail to improve.  Please call [phone] with any further questions or concerns.   At [facility], we are committed to providing exceptional care. You will receive a patient satisfaction survey through text or email regarding your visit today. Your opinion is important to me. Comments are appreciated.     Pediatric Pulmonology   Asthma Management Plan for Laura Parrish Printed: 09/19/2022  Asthma Severity: Intermittent Asthma Avoid Known Triggers: Tobacco smoke exposure, Environmental allergies: pollen, grass, and Respiratory infections (colds)  GREEN ZONE  Child is DOING WELL. No cough and no wheezing. Child is able to do usual activities. Take these Daily Maintenance medications  Singulair (Montelukat) 10mg  once a day by mouth at bedtime For Allergies: Zyrtec (Cetirizine) 10mg  by mouth once a day  YELLOW ZONE  Asthma is GETTING WORSE.  Starting to cough, wheeze, or feel short of breath. Waking at night because of asthma. Can do some activities. 1st Step - Take Quick Relief medicine below.  If possible, remove the child from the thing that made the asthma worse. Albuterol 2-4 puffs or 2.5 mg nebulized  2nd  Step - Do one of the following based on how the response. If symptoms are not better within 1 hour after the first treatment, call Rudene Christians, DO at [phone].  Continue to take GREEN ZONE  medications. If symptoms are better, continue this dose for 2 day(s) and then call the office before stopping the medicine if symptoms have not returned to the GREEN ZONE. Continue to take GREEN ZONE medications.    Budesonide (Pulmicort) 0.5 mg once a day for 1-2 weeks at the onset of a cold virus  RED ZONE  Asthma is VERY BAD. Coughing all the time. Short of breath. Trouble talking, walking or playing. 1st Step - Take Quick Relief medicine below:  Albuterol 4-6 puffs or 5mg  nebulized    2nd Step - Call Masters, Florentina Addison, DO at [phone] immediately for further instructions.  Call 911 or go to the Emergency Department if the medications are not working.   Spacer and Mouthpiece  Correct Use of MDI and Spacer with Mouthpiece  Below are the steps for the correct use of a metered dose inhaler (MDI) and spacer with MOUTHPIECE.  Patient should perform the following steps: 1.  Shake the canister for 5 seconds. 2.  Prime the MDI. (Varies depending on MDI brand, see package insert.) In general: -If MDI not used in 2 weeks or has been dropped: spray 2 puffs into air -If MDI never used before spray 3 puffs into air 3.  Insert the MDI into the spacer. 4.  Place the spacer mouthpiece into your mouth between the teeth. 5.  Close your lips around the mouthpiece and exhale normally. 6.  Press down the top of the canister to release 1 puff of medicine. 7.  Inhale the medicine through  the mouth deeply and slowly (3-5 seconds spacer whistles when breathing in too fast.  8.  Hold your breath for 10 seconds and remove the spacer from your mouth before exhaling. 9.  Wait one minute before giving another puff of the medication. 10.Caregiver supervises and advises in the process of medication administration with spacer.             11.Repeat steps 4 through 8 depending on how many puffs are indicated on the prescription.  Cleaning Instructions Remove the rubber end of spacer where the MDI fits. Rotate  spacer mouthpiece counter-clockwise and lift up to remove. Lift the valve off the clear posts at the end of the chamber. Soak the parts in warm water with clear, liquid detergent for about 15 minutes. Rinse in clean water and shake to remove excess water. Allow all parts to air dry. DO NOT dry with a towel.  To reassemble, hold chamber upright and place valve over clear posts. Replace spacer mouthpiece and turn it clockwise until it locks into place. Replace the back rubber end onto the spacer.   For more information, go to [email or web address]

## 2022-09-25 ENCOUNTER — Telehealth (INDEPENDENT_AMBULATORY_CARE_PROVIDER_SITE_OTHER): Payer: Self-pay | Admitting: Dietician

## 2022-09-25 ENCOUNTER — Ambulatory Visit (INDEPENDENT_AMBULATORY_CARE_PROVIDER_SITE_OTHER): Payer: PRIVATE HEALTH INSURANCE | Admitting: Pediatrics

## 2022-09-25 ENCOUNTER — Encounter (INDEPENDENT_AMBULATORY_CARE_PROVIDER_SITE_OTHER): Payer: Self-pay | Admitting: Pediatrics

## 2022-09-25 VITALS — BP 110/68 | HR 68 | Ht 64.37 in | Wt 121.8 lb

## 2022-09-25 DIAGNOSIS — E8840 Mitochondrial metabolism disorder, unspecified: Secondary | ICD-10-CM

## 2022-09-25 DIAGNOSIS — R638 Other symptoms and signs concerning food and fluid intake: Secondary | ICD-10-CM

## 2022-09-25 DIAGNOSIS — Z822 Family history of deafness and hearing loss: Secondary | ICD-10-CM

## 2022-09-25 DIAGNOSIS — G8929 Other chronic pain: Secondary | ICD-10-CM

## 2022-09-25 DIAGNOSIS — Z931 Gastrostomy status: Secondary | ICD-10-CM

## 2022-09-25 DIAGNOSIS — Z91014 Allergy to mammalian meats: Secondary | ICD-10-CM

## 2022-09-25 DIAGNOSIS — R519 Headache, unspecified: Secondary | ICD-10-CM | POA: Diagnosis not present

## 2022-09-25 DIAGNOSIS — M545 Low back pain, unspecified: Secondary | ICD-10-CM | POA: Diagnosis not present

## 2022-09-25 DIAGNOSIS — M792 Neuralgia and neuritis, unspecified: Secondary | ICD-10-CM

## 2022-09-25 DIAGNOSIS — G43009 Migraine without aura, not intractable, without status migrainosus: Secondary | ICD-10-CM

## 2022-09-25 DIAGNOSIS — G7000 Myasthenia gravis without (acute) exacerbation: Secondary | ICD-10-CM

## 2022-09-25 MED ORDER — RIZATRIPTAN BENZOATE 10 MG PO TABS: ORAL_TABLET | ORAL | 3 refills | Status: DC

## 2022-09-25 MED ORDER — TOPIRAMATE 25 MG PO TABS: 25.0000 mg | ORAL_TABLET | Freq: Every day | ORAL | 5 refills | Status: DC

## 2022-09-25 MED ORDER — PYRIDOSTIGMINE BROMIDE 60 MG PO TABS: 60.0000 mg | ORAL_TABLET | Freq: Four times a day (QID) | ORAL | 5 refills | Status: DC

## 2022-09-25 MED ORDER — PROMETHAZINE HCL 25 MG PO TABS: ORAL_TABLET | ORAL | 0 refills | Status: AC

## 2022-09-25 MED ORDER — TRAZODONE HCL 50 MG PO TABS: ORAL_TABLET | ORAL | 1 refills | Status: AC

## 2022-09-25 MED ORDER — PREGABALIN 25 MG PO CAPS: 25.0000 mg | ORAL_CAPSULE | Freq: Two times a day (BID) | ORAL | 5 refills | Status: DC

## 2022-09-25 MED ORDER — TOPIRAMATE ER 50 MG PO CAP24
ORAL_CAPSULE | ORAL | 5 refills | Status: DC
Start: 2022-09-25 — End: 2023-05-25

## 2022-09-25 MED ORDER — PREGABALIN 75 MG PO CAPS: 150.0000 mg | ORAL_CAPSULE | Freq: Every day | ORAL | 3 refills | Status: DC

## 2022-09-25 NOTE — Patient Instructions (Signed)
We will place a referral for audiology.  Continue all medications We will work on getting the [employer] covered by insurance.  Send a message or call Dr. Katrinka Blazing about getting the [facility] transmitter covered.  I recommend following up with Dr. Dalbert Garnet and Dr. Marshell Garfinkel

## 2022-10-12 DIAGNOSIS — M792 Neuralgia and neuritis, unspecified: Secondary | ICD-10-CM | POA: Insufficient documentation

## 2022-10-12 DIAGNOSIS — Z822 Family history of deafness and hearing loss: Secondary | ICD-10-CM | POA: Insufficient documentation

## 2022-10-30 ENCOUNTER — Ambulatory Visit (INDEPENDENT_AMBULATORY_CARE_PROVIDER_SITE_OTHER): Payer: Self-pay | Admitting: Pediatrics

## 2022-11-03 ENCOUNTER — Ambulatory Visit: Payer: PRIVATE HEALTH INSURANCE | Attending: Pediatrics | Admitting: Audiologist

## 2022-11-03 DIAGNOSIS — H9191 Unspecified hearing loss, right ear: Secondary | ICD-10-CM | POA: Insufficient documentation

## 2022-11-03 NOTE — Procedures (Signed)
  Outpatient Audiology and [facility] [address] [place], [place]  [address] [phone]  AUDIOLOGICAL  EVALUATION  NAME: Laura Parrish     DOB:   [birth year]      MRN: [id number]                                                                                     DATE: 11/03/2022     REFERENT: Rudene Christians, DO STATUS: Outpatient DIAGNOSIS: Decreased Hearing    History: Sarahelizabeth was seen for an audiological evaluation due to concern for occasional decreased hearing. She said sometimes she will have muffled hearing lasting half a day but then it goes away. She has occasional ringing every few weeks, it lasts a minute or less. She wears headphones around 30 minutes a day.  She is careful to keep the following level.  She has TMJ and when her hearing is muffled that she will feel pain in her jaw.  As she was leaving appointment, patient mentioned that mother has significant hearing loss in 1 ear and uses a device.  She is not sure of what caused the hearing loss in her mom's ear.    Medical review shows that mom has a history of otosclerosis. Medical review also shows complicated history including Chairi malformation, feeding intolerance s/p gastrostomy tube, and multiple other symptoms concerning for mitochondrial diease although genetic testing and muscle biopsy unrevealing.     Evaluation:  Otoscopy showed a clear view of the tympanic membranes, bilaterally Tympanometry results were consistent with normal middle ear function bilaterally Audiometric testing was completed using Conventional Audiometry techniques with insert earphones and TDH headphones. Test results are consistent with normal hearing bilaterally. Slight asymmetry within normal range at 500 and 1kHz with right ear worse. Speech Recognition Thresholds were obtained at 10dB in each ear. Word Recognition Testing was completed at  50dB HL and Anitria scored 100% in each ear.    Results:  The test results were  reviewed with Trachelle. She has normal hearing in each ear. She has normal middle ear function. However her right ear is slightly worse than the left. It is not a significant asymmetry, and middle ear function normal. Recommend she be monitored closely and have another test in six months due to family history. If asymmetry progresses, or unilateral symptoms are perceived, then recommend Clothilde see Otolaryngology.       Recommendations: 1.   Audiology evaluation recommended again in six months.    32 minutes spent testing and counseling on results.   If you have any questions please feel free to contact me at [phone].  Ammie Ferrier Audiologist, Au.D., CCC-A 11/03/2022  10:24 AM  Cc: Masters, Psychiatric nurse, DO

## 2022-11-19 ENCOUNTER — Other Ambulatory Visit: Payer: Self-pay | Admitting: Internal Medicine

## 2022-11-19 DIAGNOSIS — E8840 Mitochondrial metabolism disorder, unspecified: Secondary | ICD-10-CM

## 2022-11-19 DIAGNOSIS — J4530 Mild persistent asthma, uncomplicated: Secondary | ICD-10-CM

## 2022-11-26 ENCOUNTER — Other Ambulatory Visit (INDEPENDENT_AMBULATORY_CARE_PROVIDER_SITE_OTHER): Payer: Self-pay | Admitting: Family

## 2022-11-26 ENCOUNTER — Telehealth (INDEPENDENT_AMBULATORY_CARE_PROVIDER_SITE_OTHER): Payer: Self-pay | Admitting: Family

## 2022-11-26 DIAGNOSIS — E8840 Mitochondrial metabolism disorder, unspecified: Secondary | ICD-10-CM

## 2022-11-26 DIAGNOSIS — Z95828 Presence of other vascular implants and grafts: Secondary | ICD-10-CM

## 2022-11-26 DIAGNOSIS — R638 Other symptoms and signs concerning food and fluid intake: Secondary | ICD-10-CM

## 2022-11-26 DIAGNOSIS — Z9189 Other specified personal risk factors, not elsewhere classified: Secondary | ICD-10-CM

## 2022-11-26 NOTE — Telephone Encounter (Signed)
[facility] call to inform the practice that they do not take external referrals for hydration. They assert that they had been faxed a referral for hydration from [facility].

## 2022-12-01 NOTE — Telephone Encounter (Signed)
I have called infusion companies and pharmacies. There is no availability from anyone until the current IV fluid shortage has improved. TG

## 2022-12-18 ENCOUNTER — Other Ambulatory Visit (INDEPENDENT_AMBULATORY_CARE_PROVIDER_SITE_OTHER): Payer: Self-pay | Admitting: Pediatrics

## 2022-12-18 ENCOUNTER — Other Ambulatory Visit: Payer: Self-pay | Admitting: Internal Medicine

## 2022-12-19 NOTE — Telephone Encounter (Signed)
I will refill for this month, but this would not have been something I prescribed, I have never heard of this. We should work on this being transferred to her GI, and I thought she had one, but I see she hasn't been to GI in some time.  Her care plan says [facility] but the last visit was 12/2021 at [facility].  Ideas [name]?   [name] MD MPH

## 2022-12-24 ENCOUNTER — Encounter (INDEPENDENT_AMBULATORY_CARE_PROVIDER_SITE_OTHER): Payer: Self-pay

## 2023-02-12 ENCOUNTER — Other Ambulatory Visit (INDEPENDENT_AMBULATORY_CARE_PROVIDER_SITE_OTHER): Payer: Self-pay | Admitting: Pediatrics

## 2023-02-12 ENCOUNTER — Telehealth (INDEPENDENT_AMBULATORY_CARE_PROVIDER_SITE_OTHER): Payer: Self-pay | Admitting: Family

## 2023-02-12 NOTE — Telephone Encounter (Signed)
Called [name] back and asked Laura Parrish to relay the message that the forms have been received.    Laura Parrish stated that he would relay the mesage.   SS, CCMA

## 2023-02-12 NOTE — Telephone Encounter (Signed)
Laura Parrish Called back to follow up on forms, wanting to confirm if they have been received. She is requesting a call back for confirmation. Forms have also been sent over for sibling as well.   PH: [phone].

## 2023-02-12 NOTE — Telephone Encounter (Signed)
  Name of who is calling: [name] from [employer]   Caller's Relationship to Patient:  Best contact number: [phone]  Provider they see: [name]   Reason for call: Calling because they sent over a refill request for hydration IV orders. Wanted to see if that was received because pt is completely out.      PRESCRIPTION REFILL ONLY  Name of prescription:  Pharmacy:

## 2023-02-13 ENCOUNTER — Telehealth (INDEPENDENT_AMBULATORY_CARE_PROVIDER_SITE_OTHER): Payer: PRIVATE HEALTH INSURANCE | Admitting: Dietician

## 2023-02-16 ENCOUNTER — Encounter (INDEPENDENT_AMBULATORY_CARE_PROVIDER_SITE_OTHER): Payer: Self-pay

## 2023-02-25 ENCOUNTER — Other Ambulatory Visit (INDEPENDENT_AMBULATORY_CARE_PROVIDER_SITE_OTHER): Payer: Self-pay | Admitting: Pediatrics

## 2023-02-25 DIAGNOSIS — E063 Autoimmune thyroiditis: Secondary | ICD-10-CM

## 2023-03-03 ENCOUNTER — Other Ambulatory Visit (INDEPENDENT_AMBULATORY_CARE_PROVIDER_SITE_OTHER): Payer: Self-pay | Admitting: Family

## 2023-03-03 DIAGNOSIS — R5382 Chronic fatigue, unspecified: Secondary | ICD-10-CM

## 2023-03-03 DIAGNOSIS — Z95828 Presence of other vascular implants and grafts: Secondary | ICD-10-CM

## 2023-03-03 DIAGNOSIS — G901 Familial dysautonomia [Riley-Day]: Secondary | ICD-10-CM

## 2023-03-03 DIAGNOSIS — R531 Weakness: Secondary | ICD-10-CM

## 2023-03-03 DIAGNOSIS — E860 Dehydration: Secondary | ICD-10-CM

## 2023-03-03 DIAGNOSIS — Q796 Ehlers-Danlos syndrome, unspecified: Secondary | ICD-10-CM

## 2023-03-03 DIAGNOSIS — G935 Compression of brain: Secondary | ICD-10-CM

## 2023-03-03 DIAGNOSIS — E8840 Mitochondrial metabolism disorder, unspecified: Secondary | ICD-10-CM

## 2023-03-03 DIAGNOSIS — Z931 Gastrostomy status: Secondary | ICD-10-CM

## 2023-03-12 ENCOUNTER — Encounter (INDEPENDENT_AMBULATORY_CARE_PROVIDER_SITE_OTHER): Payer: Self-pay | Admitting: Pediatric Genetics

## 2023-03-25 ENCOUNTER — Ambulatory Visit (INDEPENDENT_AMBULATORY_CARE_PROVIDER_SITE_OTHER): Payer: PRIVATE HEALTH INSURANCE | Admitting: Family

## 2023-03-26 ENCOUNTER — Telehealth (INDEPENDENT_AMBULATORY_CARE_PROVIDER_SITE_OTHER): Payer: PRIVATE HEALTH INSURANCE | Admitting: Family

## 2023-03-26 ENCOUNTER — Encounter (INDEPENDENT_AMBULATORY_CARE_PROVIDER_SITE_OTHER): Payer: Self-pay

## 2023-03-26 ENCOUNTER — Encounter (INDEPENDENT_AMBULATORY_CARE_PROVIDER_SITE_OTHER): Payer: Self-pay | Admitting: Family

## 2023-03-26 DIAGNOSIS — G935 Compression of brain: Secondary | ICD-10-CM

## 2023-03-26 DIAGNOSIS — M792 Neuralgia and neuritis, unspecified: Secondary | ICD-10-CM

## 2023-03-26 DIAGNOSIS — G43009 Migraine without aura, not intractable, without status migrainosus: Secondary | ICD-10-CM | POA: Diagnosis not present

## 2023-03-26 DIAGNOSIS — R531 Weakness: Secondary | ICD-10-CM

## 2023-03-26 DIAGNOSIS — E8840 Mitochondrial metabolism disorder, unspecified: Secondary | ICD-10-CM | POA: Diagnosis not present

## 2023-03-26 DIAGNOSIS — M545 Low back pain, unspecified: Secondary | ICD-10-CM | POA: Diagnosis not present

## 2023-03-26 DIAGNOSIS — E860 Dehydration: Secondary | ICD-10-CM

## 2023-03-26 DIAGNOSIS — M549 Dorsalgia, unspecified: Secondary | ICD-10-CM

## 2023-03-26 DIAGNOSIS — Q796 Ehlers-Danlos syndrome, unspecified: Secondary | ICD-10-CM

## 2023-03-26 DIAGNOSIS — G901 Familial dysautonomia [Riley-Day]: Secondary | ICD-10-CM

## 2023-03-26 DIAGNOSIS — G8929 Other chronic pain: Secondary | ICD-10-CM

## 2023-03-26 DIAGNOSIS — Z931 Gastrostomy status: Secondary | ICD-10-CM

## 2023-03-26 DIAGNOSIS — Z95828 Presence of other vascular implants and grafts: Secondary | ICD-10-CM

## 2023-03-26 DIAGNOSIS — R5382 Chronic fatigue, unspecified: Secondary | ICD-10-CM

## 2023-03-26 MED ORDER — LIDOCAINE 4 % EX PTCH: 2.0000 | MEDICATED_PATCH | CUTANEOUS | 5 refills | Status: DC

## 2023-03-26 NOTE — Patient Instructions (Signed)
 It was a pleasure to see you today!  Instructions for you until your next appointment are as follows: I will send you information on the injectable headache medications we discussed today I sent in a prescription for the Lidocaine patches Referral was placed for acupuncture treatments Please sign up for MyChart if you have not done so. Please plan to return for follow up in 3 months or sooner if needed.  Feel free to contact our office during normal business hours at [phone] with questions or concerns. If there is no answer or the call is outside business hours, please leave a message and our clinic staff will call you back within the next business day.  If you have an urgent concern, please stay on the line for our after-hours answering service and ask for the on-call neurologist.     I also encourage you to use MyChart to communicate with me more directly. If you have not yet signed up for MyChart within [facility], the front desk staff can help you. However, please note that this inbox is NOT monitored on nights or weekends, and response can take up to 2 business days.  Urgent matters should be discussed with the on-call pediatric neurologist.   At [facility], we are committed to providing exceptional care. You will receive a patient satisfaction survey through text or email regarding your visit today. Your opinion is important to me. Comments are appreciated.

## 2023-03-26 NOTE — Progress Notes (Unsigned)
 Is the patient/family in a moving vehicle? If yes, please ask family to pull over and park in a safe place to continue the visit.  This is a [facility] E-Visit consult/follow up provided via My Chart Video Visit (Caregility). Laura Parrish and her mother Laura Parrish consented to an E-Visit consult today.  Is the patient present for the video visit? Yes Location of patient: Laura Parrish is at home Is the patient located in the state of [place]? Yes Location of provider: [name], NP-C is at office Patient was referred by Rudene Christians, DO   The following participants were involved in this E-Visit: CMA, NP, patient and her mother  This visit was done via VIDEO   Chief Complain/ Reason for E-Visit today: headache and back pain follow up Total time on call: 30 minutes Follow up: 3 months   REX MAGEE   MRN:  [id number]  03-Apr-2000   Provider: [name] NP-C Location of Care: [facility]  Visit type: Return visit  Last visit: 09/25/2022  Referral source: Rudene Christians, DO History from: Epic chart, patient and her mother  Brief history:  Copied from previous record:   Today's concerns: She  Laura Parrish has been otherwise generally healthy since she was last seen. No health concerns today other than previously mentioned.  Review of systems: Please see HPI for neurologic and other pertinent review of systems. Otherwise all other systems were reviewed and were negative.  Problem List: Patient Active Problem List   Diagnosis Date Noted   Neuropathic pain 10/12/2022   Family history of deafness and hearing loss 10/12/2022   Allergic rhinitis 09/19/2022   Anaphylaxis due to hymenoptera venom 06/16/2022   Allergic conjunctivitis of both eyes 06/16/2022   Latex allergy, contact dermatitis 06/16/2022   Viral URI 02/14/2022   Abdominal pain 07/19/2021   Insomnia 05/31/2020   Dysautonomia (HCC) 12/02/2019    Myasthenia gravis (HCC) 12/02/2019   Healthcare maintenance 12/02/2019   Raynaud's phenomenon 12/01/2019   Menorrhagia 12/01/2019   Referred ear pain, bilateral 11/01/2019   Feeding by G-tube (HCC) 03/07/2019   Thyroiditis, autoimmune 02/06/2018   Goiter 02/06/2018   Weakness 02/06/2018   Hyperprolactinemia (HCC) 02/06/2018   Hyperhidrosis of palms and soles 02/06/2018   Dehydration 02/06/2018   At risk for dehydration 10/12/2017   Breakthrough bleeding on birth control pills 06/25/2017   Alteration in metabolic regulation 06/25/2017   Ehlers-Danlos syndrome 12/29/2016   Snoring 09/28/2016   Port-A-Cath in place 09/22/2016   Iron deficiency anemia 07/23/2016   Health care home, active care coordination 07/23/2016   Complex care coordination 07/23/2016   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   Chronic bilateral low back pain without sciatica 05/22/2016   Clubfoot, 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   Paresthesia 05/22/2016   Premature birth 05/22/2016   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   Allergy to insect bites and stings 04/10/2016   Atopic dermatitis 04/10/2016   Contact dermatitis due to chemicals 04/10/2016   Chronic rhinitis 04/10/2016   Allergic urticaria 04/10/2016   Chronic fatigue 02/15/2015   Lactose intolerance 01/27/2013   Migraine without aura and without status migrainosus, not intractable 10/01/2012   Mitochondrial disease (HCC) 03/15/2012   Irregular menstrual cycle 09/29/2011   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   Talipes 05/29/2000     Past Medical History:  Diagnosis Date   Allergy to alpha-gal    elevated IgE 04/10/16   Autonomic dysfunction  Chiari I malformation (HCC)    Complication of anesthesia    slow to wake up   Dyspnea    Eczema    GERD (gastroesophageal reflux disease)    Headache    Heart murmur    Hypoglycemia    Hypothyroid    IBS (irritable bowel syndrome)    Joint pain     Mitochondrial disease (HCC)    Muscle pain    Pneumonia    several times   PONV (postoperative nausea and vomiting)     Past medical history comments: See HPI Copied from previous record:   Surgical history: Past Surgical History:  Procedure Laterality Date   ADENOIDECTOMY     2004or 2005   chiari decompression  2007   GALLBLADDER SURGERY  2015   GASTROSTOMY TUBE PLACEMENT     x 2   IR CV LINE INJECTION  07/06/2020   MUSCLE BIOPSY  2010   Port Replacement  05/2018   x 2 orignal 08/2016 then replacement 05/2018   PORT-A-CATH REMOVAL N/A 03/24/2019   Procedure: PORT-A-CATH REMOVAL;  Surgeon: Rodman Pickle, MD;  Location: [facility] OR;  Service: General;  Laterality: N/A;   PORTACATH PLACEMENT Right 03/24/2019   Procedure: PORT-A-CATH INSERTION under ultrasound and fluoroscopic guidance;  Surgeon: Rodman Pickle, MD;  Location: [facility] OR;  Service: General;  Laterality: Right;   REMOVAL OF GASTROSTOMY TUBE N/A 03/24/2019   Procedure: Exchange of Gastrostomy Tube;  Surgeon: Rodman Pickle, MD;  Location: [facility] OR;  Service: General;  Laterality: N/A;   tethered cord  07/2006   TONSILLECTOMY     [id number]     Family history: family history includes Allergic rhinitis in her brother, maternal grandmother, mother, and sister; Asthma in her brother, maternal grandmother, and mother; Diabetes type II in her maternal grandmother; Heart murmur in her mother; Lung cancer in her maternal grandfather; Mitochondrial disorder in her brother; Seizures in her brother; Thyroid disease in her sister.   Social history: Social History   Socioeconomic History   Marital status: Single    Spouse name: Not on file   Number of children: Not on file   Years of education: Not on file   Highest education level: Not on file  Occupational History   Not on file  Tobacco Use   Smoking status: Never    Passive exposure: Never   Smokeless tobacco: Never  Vaping Use   Vaping status: Never Used   Substance and Sexual Activity   Alcohol use: No   Drug use: No   Sexual activity: Never    Birth control/protection: None  Other Topics Concern   Not on file  Social History Narrative   Shelvie graduated from [employer].    Graduated from [employer]. Currently taking break.   She enjoys reading, playing with her cat and her dog, and hanging out with her sister.    Social Drivers of Corporate investment banker Strain: Not on file  Food Insecurity: Low Risk  (12/31/2022)   Received from [facility]   Hunger Vital Sign    Worried About Running Out of Food in the Last Year: Never true    Ran Out of Food in the Last Year: Never true  Transportation Needs: No Transportation Needs (12/31/2022)   Received from [facility]    In the past 12 months, has lack of reliable transportation kept you from medical appointments, meetings, work or from getting things needed for daily  living? : No  Physical Activity: Not on file  Stress: Not on file  Social Connections: Moderately Integrated (02/12/2022)   Social Connection and Isolation Panel [NHANES]    Frequency of Communication with Friends and Family: More than three times a week    Frequency of Social Gatherings with Friends and Family: More than three times a week    Attends Religious Services: More than 4 times per year    Active Member of [employer] or Organizations: Yes    Attends Banker Meetings: More than 4 times per year    Marital Status: Never married  Intimate Partner Violence: Not At Risk (02/12/2022)   Humiliation, Afraid, Rape, and Kick questionnaire    Fear of Current or Ex-Partner: No    Emotionally Abused: No    Physically Abused: No    Sexually Abused: No      Past/failed meds: Copied from previous record:  Allergies: Allergies  Allergen Reactions   Other Anaphylaxis    Alpha Gal allergy \ Red meat\ any hoof animal   Latex Rash   Lactated Ringers      Contraindicated w/ mitochondrial syndrome   Propofol Other (See Comments)    Due to possible mitochondrial disorder Due to possible mitochondrial disorder Due to possible mitochondrial disorder   Tape Itching and Other (See Comments)      Immunizations: Immunization History  Administered Date(s) Administered   DTaP 07/25/1999, 10/02/1999, 11/27/1999, 09/08/2000, 08/21/2004   H1N1 11/10/2007   HIB (PRP-OMP) 07/25/1999, 10/02/1999, 11/27/1999, 09/08/2000   HPV Quadrivalent 05/17/2012   Hepatitis A, Adult 08/21/2004, 11/10/2007   Hepatitis B, ADULT March 20, 1999, 06/27/1999, 11/27/1999   IPV 07/25/1999, 10/02/1999, 09/08/2000, 08/21/2004   Influenza, Seasonal, Injecte, Preservative Fre 11/10/2007, 10/18/2008, 10/01/2009, 11/01/2010, 10/31/2011, 09/19/2022   Influenza,inj,Quad PF,6+ Mos 10/13/2013, 10/12/2014, 10/03/2015, 10/15/2018, 11/17/2019   Influenza,trivalent, recombinat, inj, PF 10/08/2012   Influenza-Unspecified 10/12/2014   MMR 06/29/2000, 08/21/2004   Meningococcal Conjugate 06/05/2009   PFIZER Comirnaty(Gray Top)Covid-19 Tri-Sucrose Vaccine 01/17/2019, 05/02/2019, 05/23/2019   Pneumococcal-Unspecified 07/25/1999, 10/02/1999, 11/27/1999, 06/29/2000   Tdap 06/05/2009   Varicella 06/29/2000, 05/07/2009      Diagnostics/Screenings: Copied from previous record:   Physical Exam: LMP 10/28/2022 (Approximate)     Impression: No diagnosis found.    Recommendations for plan of care: The patient's previous Epic records were reviewed. No recent diagnostic studies to be reviewed with the patient.  Plan until next visit: Continue medications as prescribed  Call for questions or concerns No follow-ups on file.  The medication list was reviewed and reconciled. No changes were made in the prescribed medications today. A complete medication list was provided to the patient.  No orders of the defined types were placed in this encounter.    Allergies as of 03/26/2023        Reactions   Other Anaphylaxis   Alpha Gal allergy \ Red meat\ any hoof animal   Latex Rash   Lactated Ringers    Contraindicated w/ mitochondrial syndrome   Propofol Other (See Comments)   Due to possible mitochondrial disorder Due to possible mitochondrial disorder Due to possible mitochondrial disorder   Tape Itching, Other (See Comments)        Medication List        Accurate as of March 26, 2023  9:42 AM. If you have any questions, ask your nurse or doctor.          albuterol 108 (90 Base) MCG/ACT inhaler Commonly known as: Proventil HFA Inhale 1-2 puffs into the  lungs every 4 (four) hours as needed for wheezing or shortness of breath.   albuterol 0.63 MG/3ML nebulizer solution Commonly known as: ACCUNEB Take 3ml by nebulizer 3 times per day as needed for shortness of breath   ascorbic acid 500 MG tablet Commonly known as: VITAMIN C Take 500 mg by mouth daily.   azelastine 0.1 % nasal spray Commonly known as: ASTELIN Place 2 sprays into both nostrils 2 (two) times daily. Use in each nostril as directed   Baqsimi Two Pack 3 MG/DOSE Powd Generic drug: Glucagon Place 1 spray into the nose as directed.   budesonide 0.5 MG/2ML nebulizer solution Commonly known as: Pulmicort Start at onset of respiratory illness-use 1 vial twice daily inhaled via nebulizer for 2 weeks or until symptoms resolve.   cetirizine 10 MG tablet Commonly known as: ZYRTEC TAKE 1 TABLET BY MOUTH EVERY MORNING & TAKE 1 TABLET AT BEDTIME   clobetasol 0.05 % topical foam Commonly known as: OLUX Apply topically 2 (two) times daily. Apply to affected areas twice daily as needed taking care to avoid axillae and groin area.   Coenzyme Q-10 200 MG Caps Take 1 capsule by mouth daily.   cromolyn 100 MG/5ML solution Commonly known as: GASTROCROM Take 10 mLs (200 mg total) by mouth 4 (four) times daily -  before meals and at bedtime.   Dexcom G6 Receiver Devi 1 Device by Does not apply route  as directed.   Dexcom G6 Sensor Misc APPLY 1 SENSOR INTO THE SKIN AS DIRECTED(CHANGE SENSOR EVERY 10 DAYS)   Dexcom G6 Transmitter Misc INJECT 1 DEVICE INTO THE SKIN AS DIRECTED (REUSE UP TO 8 TIMES WITH EACH NEW SENSOR)   EPINEPHrine 0.3 mg/0.3 mL Soaj injection Commonly known as: EPI-PEN Inject 0.3 mg into the muscle as needed for anaphylaxis. As needed   Eucrisa 2 % Oint Generic drug: Crisaborole Apply 1 Application topically 2 (two) times daily as needed. Good option for face.   hydrocortisone 2.5 % ointment Apply topically twice daily as need to red sandpapery rash.   Iron-Vitamin C 65-125 MG Tabs Take 1 tablet by mouth daily.   leucovorin 25 MG tablet Commonly known as: WELLCOVORIN TAKE 1 TABLET BY MOUTH 2 TIMES A DAY   levOCARNitine 1 GM/10ML solution Commonly known as: CARNITOR TAKE 2 TEASPOONFUL ( ) BY MOUTH TWICEDAILY   lidocaine 5 % ointment Commonly known as: XYLOCAINE Apply 1 application topically as needed.   metaxalone 800 MG tablet Commonly known as: SKELAXIN TAKE 1/2 TABLET (400MG  TOTAL) BY MOUTH DAILY AS NEEDED (MUSCLE PAIN)   montelukast 10 MG tablet Commonly known as: SINGULAIR TAKE 1 TABLET BY MOUTH AT BEDTIME   Nurtec 75 MG Tbdp Generic drug: Rimegepant Sulfate Take 1 tablet (75 mg total) by mouth as needed (at onset of migraine).   Nutritional Supplement Plus Liqd 3 cartons (711 mL) of Ensure Clear given via gtube daily.   Nutritional Supplement Plus Liqd 237-474 mL (1-2 cartons) Boost Breeze (peach flavored only) given po daily.   Nutritional Supplement Plus Liqd 30 mL Prostat mixed with 30-60 mL water flush given via gtube daily.   Olopatadine HCl 0.2 % Soln Commonly known as: Pataday Place 1 drop into both eyes daily as needed.   ondansetron 4 MG disintegrating tablet Commonly known as: ZOFRAN-ODT Take 1 tablet under the tongue every 6-8 hours as needed for nausea.   polyethylene glycol powder 17 GM/SCOOP powder Commonly  known as: GLYCOLAX/MIRALAX 17 g or one capful in G-Tube.   pregabalin  25 MG capsule Commonly known as: LYRICA Take 1 capsule (25 mg total) by mouth 2 (two) times daily.   pregabalin 75 MG capsule Commonly known as: LYRICA Take 2 capsules (150 mg total) by mouth at bedtime.   promethazine 25 MG tablet Commonly known as: PHENERGAN Take 1 tablet at onset of migraine. May repeat every 6 hours PRN   pyridostigmine 60 MG tablet Commonly known as: MESTINON Take 1 tablet (60 mg total) by mouth in the morning, at noon, in the evening, and at bedtime.   RABEprazole 20 MG tablet Commonly known as: ACIPHEX TAKE 1 TABLET BY MOUTH 2 TIMES A DAY   rizatriptan 10 MG tablet Commonly known as: MAXALT TAKE 1 TABLET BY MOUTH AS NEEDED FOR MIGRAINE. MAY REPEAT IN 2 HOURS IF NEEDED.   Slynd 4 MG Tabs Generic drug: Drospirenone Take 1 tablet by mouth daily.   Synthroid 88 MCG tablet Generic drug: levothyroxine TAKE 1 TABLET BY MOUTH EVERY MORNING ON AN EMPTY STOMACH   topiramate 25 MG tablet Commonly known as: TOPAMAX Take 1 tablet (25 mg total) by mouth daily after breakfast.   Topiramate ER 50 MG Cp24 Commonly known as: TROKENDI XR TAKE 1 CAPSULE BY MOUTH DAILY AT BEDTIME   traZODone 50 MG tablet Commonly known as: DESYREL TAKE 1/2 TABLET BY MOUTH 30 MINUTES PRIOR TO BEDTIME. IF NEEDED, MAY INCREASE TO 1 TABLET BEFORE BEDTIME   vitamin E 200 UNIT capsule Take 200 Units by mouth daily.            I discussed this patient's care with the multiple providers involved in her care today to develop this assessment and plan.   Total time spent with the patient was *** minutes, of which 50% or more was spent in counseling and coordination of care.  [name] NP-C [facility] [address] [place], [place] [address] Ph. [phone] Fax [phone]

## 2023-03-27 ENCOUNTER — Encounter (INDEPENDENT_AMBULATORY_CARE_PROVIDER_SITE_OTHER): Payer: Self-pay | Admitting: Family

## 2023-03-27 DIAGNOSIS — M549 Dorsalgia, unspecified: Secondary | ICD-10-CM | POA: Insufficient documentation

## 2023-03-30 ENCOUNTER — Other Ambulatory Visit (INDEPENDENT_AMBULATORY_CARE_PROVIDER_SITE_OTHER): Payer: Self-pay | Admitting: Pediatrics

## 2023-03-31 MED ORDER — PREGABALIN 75 MG PO CAPS: 75.0000 mg | ORAL_CAPSULE | Freq: Every evening | ORAL | 0 refills | Status: DC

## 2023-04-06 ENCOUNTER — Telehealth (INDEPENDENT_AMBULATORY_CARE_PROVIDER_SITE_OTHER): Payer: Self-pay | Admitting: Family

## 2023-04-06 DIAGNOSIS — E860 Dehydration: Secondary | ICD-10-CM

## 2023-04-06 DIAGNOSIS — R531 Weakness: Secondary | ICD-10-CM

## 2023-04-06 DIAGNOSIS — E8840 Mitochondrial metabolism disorder, unspecified: Secondary | ICD-10-CM

## 2023-04-06 NOTE — Telephone Encounter (Signed)
 [name] with [facility] called to report that Zaeda has nausea, vomiting, fatigue and weakness. Her BP is 92/48. [name] asked about orders for D10 infusion that Aliea has received in the past when sick. I will send in the orders. TG

## 2023-04-09 ENCOUNTER — Other Ambulatory Visit (INDEPENDENT_AMBULATORY_CARE_PROVIDER_SITE_OTHER): Payer: Self-pay | Admitting: Family

## 2023-04-09 DIAGNOSIS — M792 Neuralgia and neuritis, unspecified: Secondary | ICD-10-CM

## 2023-04-09 MED ORDER — EMGALITY 120 MG/ML ~~LOC~~ SOAJ: SUBCUTANEOUS | 5 refills | Status: DC

## 2023-04-09 MED ORDER — PREGABALIN 75 MG PO CAPS: 150.0000 mg | ORAL_CAPSULE | Freq: Every evening | ORAL | 5 refills | Status: DC

## 2023-04-09 MED ORDER — EMGALITY 120 MG/ML ~~LOC~~ SOAJ: 240.0000 mg | Freq: Once | SUBCUTANEOUS | 0 refills | Status: AC

## 2023-04-13 NOTE — Progress Notes (Signed)
 Laura Parrish   MRN:  [id number]  11/20/2000   Provider: [name] NP-C Location of Care: [facility]  Visit type: Return visit  Last visit: 03/26/2023  Referral source: Masters, Florentina Addison, DO History from: Epic chart, patient and her mother  Brief history:  Copied from previous record: History of Chiari malformation s/p repair, tethered cord s/p repair, feeding intolerance s/p gastrostomy tube, lactose intolerance and alpha gal allergy, multiple hormonal irregularities including hypothyroidism, chronic headaches, chronic GI complaints, neuropathic pain in her feet, and symptoms concerning for mitochondrial diease although genetic testing and muscle biopsy.   Today's concerns: She returns today for training on self injection of Emgality Izetta has been otherwise generally healthy since she was last seen. No health concerns today other than previously mentioned.  Review of systems: Please see HPI for neurologic and other pertinent review of systems. Otherwise all other systems were reviewed and were negative.  Problem List: Patient Active Problem List   Diagnosis Date Noted   Upper back pain 03/27/2023   Neuropathic pain 10/12/2022   Family history of deafness and hearing loss 10/12/2022   Allergic rhinitis 09/19/2022   Anaphylaxis due to hymenoptera venom 06/16/2022   Allergic conjunctivitis of both eyes 06/16/2022   Latex allergy, contact dermatitis 06/16/2022   Viral URI 02/14/2022   Abdominal pain 07/19/2021   Insomnia 05/31/2020   Dysautonomia (HCC) 12/02/2019   Myasthenia gravis (HCC) 12/02/2019   Healthcare maintenance 12/02/2019   Raynaud's phenomenon 12/01/2019   Menorrhagia 12/01/2019   Referred ear pain, bilateral 11/01/2019   Feeding by G-tube (HCC) 03/07/2019   Thyroiditis, autoimmune 02/06/2018   Goiter 02/06/2018   Weakness 02/06/2018   Hyperprolactinemia (HCC) 02/06/2018   Hyperhidrosis of palms and soles  02/06/2018   Dehydration 02/06/2018   At risk for dehydration 10/12/2017   Breakthrough bleeding on birth control pills 06/25/2017   Alteration in metabolic regulation 06/25/2017   Ehlers-Danlos syndrome 12/29/2016   Snoring 09/28/2016   Port-A-Cath in place 09/22/2016   Iron deficiency anemia 07/23/2016   Health care home, active care coordination 07/23/2016   Complex care coordination 07/23/2016   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   Chronic bilateral low back pain without sciatica 05/22/2016   Clubfoot, 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   Paresthesia 05/22/2016   Premature birth 05/22/2016   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   Allergy to insect bites and stings 04/10/2016   Atopic dermatitis 04/10/2016   Contact dermatitis due to chemicals 04/10/2016   Chronic rhinitis 04/10/2016   Allergic urticaria 04/10/2016   Chronic fatigue 02/15/2015   Lactose intolerance 01/27/2013   Migraine without aura and without status migrainosus, not intractable 10/01/2012   Mitochondrial disease (HCC) 03/15/2012   Irregular menstrual cycle 09/29/2011   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   Talipes 05/29/2000     Past Medical History:  Diagnosis Date   Allergy to alpha-gal    elevated IgE 04/10/16   Autonomic dysfunction    Chiari I malformation (HCC)    Complication of anesthesia    slow to wake up   Dyspnea    Eczema    GERD (gastroesophageal reflux disease)    Headache    Heart murmur    Hypoglycemia    Hypothyroid    IBS (irritable bowel syndrome)    Joint pain    Mitochondrial disease (HCC)    Muscle pain    Pneumonia    several times   PONV (postoperative nausea and  vomiting)     Past medical history comments: See HPI  Surgical history: Past Surgical History:  Procedure Laterality Date   ADENOIDECTOMY     2004or 2005   chiari decompression  2007   GALLBLADDER SURGERY  2015   GASTROSTOMY TUBE PLACEMENT     x 2   IR CV LINE  INJECTION  07/06/2020   MUSCLE BIOPSY  2010   Port Replacement  05/2018   x 2 orignal 08/2016 then replacement 05/2018   PORT-A-CATH REMOVAL N/A 03/24/2019   Procedure: PORT-A-CATH REMOVAL;  Surgeon: Rodman Pickle, MD;  Location: [facility] OR;  Service: General;  Laterality: N/A;   PORTACATH PLACEMENT Right 03/24/2019   Procedure: PORT-A-CATH INSERTION under ultrasound and fluoroscopic guidance;  Surgeon: Rodman Pickle, MD;  Location: [facility] OR;  Service: General;  Laterality: Right;   REMOVAL OF GASTROSTOMY TUBE N/A 03/24/2019   Procedure: Exchange of Gastrostomy Tube;  Surgeon: Rodman Pickle, MD;  Location: [facility] OR;  Service: General;  Laterality: N/A;   tethered cord  07/2006   TONSILLECTOMY     [id number]    Family history: family history includes Allergic rhinitis in her brother, maternal grandmother, mother, and sister; Asthma in her brother, maternal grandmother, and mother; Diabetes type II in her maternal grandmother; Heart murmur in her mother; Lung cancer in her maternal grandfather; Mitochondrial disorder in her brother; Seizures in her brother; Thyroid disease in her sister.   Social history: Social History   Socioeconomic History   Marital status: Single    Spouse name: Not on file   Number of children: Not on file   Years of education: Not on file   Highest education level: Not on file  Occupational History   Not on file  Tobacco Use   Smoking status: Never    Passive exposure: Never   Smokeless tobacco: Never  Vaping Use   Vaping status: Never Used  Substance and Sexual Activity   Alcohol use: No   Drug use: No   Sexual activity: Never    Birth control/protection: None  Other Topics Concern   Not on file  Social History Narrative   K[place]oua graduated from [employer].    Graduated from [employer]. Currently teaching online.    She enjoys reading, playing with her cat and her dog, and hanging out with her sister.    Social  Drivers of Corporate investment banker Strain: Not on file  Food Insecurity: Low Risk  (12/31/2022)   Received from [facility]   Hunger Vital Sign    Worried About Running Out of Food in the Last Year: Never true    Ran Out of Food in the Last Year: Never true  Transportation Needs: No Transportation Needs (12/31/2022)   Received from [facility]    In the past 12 months, has lack of reliable transportation kept you from medical appointments, meetings, work or from getting things needed for daily living? : No  Physical Activity: Not on file  Stress: Not on file  Social Connections: Moderately Integrated (02/12/2022)   Social Connection and Isolation Panel [NHANES]    Frequency of Communication with Friends and Family: More than three times a week    Frequency of Social Gatherings with Friends and Family: More than three times a week    Attends Religious Services: More than 4 times per year    Active Member of [employer] or Organizations: Yes    Attends Banker Meetings:  More than 4 times per year    Marital Status: Never married  Intimate Partner Violence: Not At Risk (02/12/2022)   Humiliation, Afraid, Rape, and Kick questionnaire    Fear of Current or Ex-Partner: No    Emotionally Abused: No    Physically Abused: No    Sexually Abused: No   Past/failed meds:  Allergies: Allergies  Allergen Reactions   Other Anaphylaxis    Alpha Gal allergy \ Red meat\ any hoof animal   Latex Rash   Lactated Ringers     Contraindicated w/ mitochondrial syndrome   Propofol Other (See Comments)    Due to possible mitochondrial disorder Due to possible mitochondrial disorder Due to possible mitochondrial disorder   Tape Itching and Other (See Comments)   Immunizations: Immunization History  Administered Date(s) Administered   DTaP 07/25/1999, 10/02/1999, 11/27/1999, 09/08/2000, 08/21/2004   H1N1 11/10/2007   HIB (PRP-OMP) 07/25/1999, 10/02/1999, 11/27/1999,  09/08/2000   HPV Quadrivalent 05/17/2012   Hepatitis A, Adult 08/21/2004, 11/10/2007   Hepatitis B, ADULT 22[birth year]Jun-1999, 06/27/1999, 11/27/1999   IPV 07/25/1999, 10/02/1999, 09/08/2000, 08/21/2004   Influenza, Seasonal, Injecte, Preservative Fre 11/10/2007, 10/18/2008, 10/01/2009, 11/01/2010, 10/31/2011, 09/19/2022   Influenza,inj,Quad PF,6+ Mos 10/13/2013, 10/12/2014, 10/03/2015, 10/15/2018, 11/17/2019   Influenza,trivalent, recombinat, inj, PF 10/08/2012   Influenza-Unspecified 10/12/2014   MMR 06/29/2000, 08/21/2004   Meningococcal Conjugate 06/05/2009   PFIZER Comirnaty(Gray Top)Covid-19 Tri-Sucrose Vaccine 01/17/2019, 05/02/2019, 05/23/2019   Pneumococcal-Unspecified 07/25/1999, 10/02/1999, 11/27/1999, 06/29/2000   Tdap 06/05/2009   Varicella 06/29/2000, 05/07/2009   Diagnostics/Screenings:  Physical Exam: BP 102/62 (BP Location: Left Arm, Patient Position: Sitting)   Pulse 76   Ht 5' 2.99" (1.6 m)   Wt 126 lb (57.2 kg)   LMP 10/28/2022 (Approximate)   BMI 22.33 kg/m   Examination deferred as she was seen for self-injection training  Impression: Injection education, encounter for  Migraine without aura and without status migrainosus, not intractable - Plan: Galcanezumab-gnlm SOAJ 240 mg  Mitochondrial disease (HCC)   Recommendations for plan of care: The patient's previous Epic records were reviewed. No recent diagnostic studies to be reviewed with the patient.  Zayda was seen today for training on self injection of Emgality. She brought the [facility] autoinjectors with her that she picked up from the pharmacy. I demonstrated how to administer the medication and she returned the demonstration by giving herself one injection in the left outer thigh and one injection in the right outer thigh for a total of 240mg  of Emgality as a loading dose. She had no side effects after the injections and feels comfortable doing the monthly injections at home.   Emgality information  NDC -  [id number] C Exp - 09/26/24  Plan until next visit: Administer Emgality 120mg  every 28 to 30 days Continue other medications as prescribed  Keep track of how often migraines occur. If migraine frequency improves, we will consider tapering Topiramate in about 3 months from now Call for questions or concerns Keep the April 17 appointment for now. If doing well, we may reschedule to 3 months from now  The medication list was reviewed and reconciled. No changes were made in the prescribed medications today. A complete medication list was provided to the patient.  Allergies as of 04/14/2023       Reactions   Other Anaphylaxis   Alpha Gal allergy \ Red meat\ any hoof animal   Latex Rash   Lactated Ringers    Contraindicated w/ mitochondrial syndrome   Propofol Other (See Comments)   Due to possible  mitochondrial disorder Due to possible mitochondrial disorder Due to possible mitochondrial disorder   Tape Itching, Other (See Comments)        Medication List        Accurate as of April 14, 2023 11:59 PM. If you have any questions, ask your nurse or doctor.          albuterol 108 (90 Base) MCG/ACT inhaler Commonly known as: Proventil HFA Inhale 1-2 puffs into the lungs every 4 (four) hours as needed for wheezing or shortness of breath.   albuterol 0.63 MG/3ML nebulizer solution Commonly known as: ACCUNEB Take 3ml by nebulizer 3 times per day as needed for shortness of breath   ascorbic acid 500 MG tablet Commonly known as: VITAMIN C Take 500 mg by mouth daily.   azelastine 0.1 % nasal spray Commonly known as: ASTELIN Place 2 sprays into both nostrils 2 (two) times daily. Use in each nostril as directed   Baqsimi Two Pack 3 MG/DOSE Powd Generic drug: Glucagon Place 1 spray into the nose as directed.   budesonide 0.5 MG/2ML nebulizer solution Commonly known as: Pulmicort Start at onset of respiratory illness-use 1 vial twice daily inhaled via nebulizer for 2 weeks or until  symptoms resolve.   cetirizine 10 MG tablet Commonly known as: ZYRTEC TAKE 1 TABLET BY MOUTH EVERY MORNING & TAKE 1 TABLET AT BEDTIME   clobetasol 0.05 % topical foam Commonly known as: OLUX Apply topically 2 (two) times daily. Apply to affected areas twice daily as needed taking care to avoid axillae and groin area.   Coenzyme Q-10 200 MG Caps Take 1 capsule by mouth daily.   cromolyn 100 MG/5ML solution Commonly known as: GASTROCROM Take 10 mLs (200 mg total) by mouth 4 (four) times daily -  before meals and at bedtime.   Dexcom G6 Receiver Devi 1 Device by Does not apply route as directed.   Dexcom G6 Sensor Misc APPLY 1 SENSOR INTO THE SKIN AS DIRECTED(CHANGE SENSOR EVERY 10 DAYS)   Dexcom G6 Transmitter Misc INJECT 1 DEVICE INTO THE SKIN AS DIRECTED (REUSE UP TO 8 TIMES WITH EACH NEW SENSOR)   Emgality 120 MG/ML Soaj Generic drug: Galcanezumab-gnlm Inject 120mg  subcutaneously every 28 days as maintenance dose Start taking on: April 30, 2023   EPINEPHrine 0.3 mg/0.3 mL Soaj injection Commonly known as: EPI-PEN Inject 0.3 mg into the muscle as needed for anaphylaxis. As needed   Eucrisa 2 % Oint Generic drug: Crisaborole Apply 1 Application topically 2 (two) times daily as needed. Good option for face.   hydrocortisone 2.5 % ointment Apply topically twice daily as need to red sandpapery rash.   Iron-Vitamin C 65-125 MG Tabs Take 1 tablet by mouth daily.   leucovorin 25 MG tablet Commonly known as: WELLCOVORIN TAKE 1 TABLET BY MOUTH 2 TIMES A DAY   levOCARNitine 1 GM/10ML solution Commonly known as: CARNITOR TAKE 2 TEASPOONFUL ( ) BY MOUTH TWICEDAILY   levocetirizine 5 MG tablet Commonly known as: XYZAL Take by mouth.   levonorgestrel-ethinyl estradiol 0.15-0.03 MG tablet Commonly known as: SEASONALE Take 1 tablet by mouth daily.   lidocaine 5 % ointment Commonly known as: XYLOCAINE Apply 1 application topically as needed.   lidocaine 4 % Place 2  patches onto the skin daily.   metaxalone 800 MG tablet Commonly known as: SKELAXIN TAKE 1/2 TABLET (400MG  TOTAL) BY MOUTH DAILY AS NEEDED (MUSCLE PAIN)   montelukast 10 MG tablet Commonly known as: SINGULAIR TAKE 1 TABLET BY MOUTH AT BEDTIME  Nurtec 75 MG Tbdp Generic drug: Rimegepant Sulfate Take 1 tablet (75 mg total) by mouth as needed (at onset of migraine).   Nutritional Supplement Plus Liqd 3 cartons (711 mL) of Ensure Clear given via gtube daily.   Nutritional Supplement Plus Liqd 237-474 mL (1-2 cartons) Boost Breeze (peach flavored only) given po daily.   Nutritional Supplement Plus Liqd 30 mL Prostat mixed with 30-60 mL water flush given via gtube daily.   Olopatadine HCl 0.2 % Soln Commonly known as: Pataday Place 1 drop into both eyes daily as needed.   ondansetron 4 MG disintegrating tablet Commonly known as: ZOFRAN-ODT Take 1 tablet under the tongue every 6-8 hours as needed for nausea.   polyethylene glycol powder 17 GM/SCOOP powder Commonly known as: GLYCOLAX/MIRALAX 17 g or one capful in G-Tube.   pregabalin 75 MG capsule Commonly known as: LYRICA Take 2 capsules (150 mg total) by mouth at bedtime.   promethazine 25 MG tablet Commonly known as: PHENERGAN Take 1 tablet at onset of migraine. May repeat every 6 hours PRN   pyridostigmine 60 MG tablet Commonly known as: MESTINON Take 1 tablet (60 mg total) by mouth in the morning, at noon, in the evening, and at bedtime.   RABEprazole 20 MG tablet Commonly known as: ACIPHEX TAKE 1 TABLET BY MOUTH 2 TIMES A DAY   rizatriptan 10 MG tablet Commonly known as: MAXALT TAKE 1 TABLET BY MOUTH AS NEEDED FOR MIGRAINE. MAY REPEAT IN 2 HOURS IF NEEDED.   Slynd 4 MG Tabs Generic drug: Drospirenone Take 1 tablet by mouth daily.   Synthroid 88 MCG tablet Generic drug: levothyroxine TAKE 1 TABLET BY MOUTH EVERY MORNING ON AN EMPTY STOMACH   topiramate 25 MG tablet Commonly known as: TOPAMAX Take 1  tablet (25 mg total) by mouth daily after breakfast.   Topiramate ER 50 MG Cp24 Commonly known as: TROKENDI XR TAKE 1 CAPSULE BY MOUTH DAILY AT BEDTIME   traZODone 50 MG tablet Commonly known as: DESYREL TAKE 1/2 TABLET BY MOUTH 30 MINUTES PRIOR TO BEDTIME. IF NEEDED, MAY INCREASE TO 1 TABLET BEFORE BEDTIME   vitamin E 200 UNIT capsule Take 200 Units by mouth daily.      Total time spent with the patient was 30 minutes, of which 50% or more was spent in counseling and coordination of care.  [name] NP-C [facility] [address] [place], [place] [address] Ph. [phone] Fax [phone]

## 2023-04-14 ENCOUNTER — Ambulatory Visit (INDEPENDENT_AMBULATORY_CARE_PROVIDER_SITE_OTHER): Payer: PRIVATE HEALTH INSURANCE | Admitting: Family

## 2023-04-14 ENCOUNTER — Encounter (INDEPENDENT_AMBULATORY_CARE_PROVIDER_SITE_OTHER): Payer: Self-pay | Admitting: Family

## 2023-04-14 VITALS — BP 102/62 | HR 76 | Ht 62.99 in | Wt 126.0 lb

## 2023-04-14 DIAGNOSIS — Z7189 Other specified counseling: Secondary | ICD-10-CM | POA: Diagnosis not present

## 2023-04-14 DIAGNOSIS — E8840 Mitochondrial metabolism disorder, unspecified: Secondary | ICD-10-CM | POA: Diagnosis not present

## 2023-04-14 DIAGNOSIS — G43009 Migraine without aura, not intractable, without status migrainosus: Secondary | ICD-10-CM

## 2023-04-14 MED ORDER — GALCANEZUMAB-GNLM 120 MG/ML ~~LOC~~ SOAJ: 240.0000 mg | Freq: Once | SUBCUTANEOUS | Status: DC

## 2023-04-14 NOTE — Patient Instructions (Addendum)
 It was a pleasure to see you today! You did a great job with giving the Emgality injections today!  Instructions for you until your next appointment are as follows: Going forward, you will give yourself one Emgality injection every 28-30 days.  Keep track of headaches so we can determine if this treatment is helpful to you Let me know if you have any questions or concerns Please sign up for MyChart if you have not done so. Please plan to return for follow up later this month as scheduled or sooner if needed.  Feel free to contact our office during normal business hours at [phone] with questions or concerns. If there is no answer or the call is outside business hours, please leave a message and our clinic staff will call you back within the next business day.  If you have an urgent concern, please stay on the line for our after-hours answering service and ask for the on-call neurologist.     I also encourage you to use MyChart to communicate with me more directly. If you have not yet signed up for MyChart within [facility], the front desk staff can help you. However, please note that this inbox is NOT monitored on nights or weekends, and response can take up to 2 business days.  Urgent matters should be discussed with the on-call pediatric neurologist.   At [facility], we are committed to providing exceptional care. You will receive a patient satisfaction survey through text or email regarding your visit today. Your opinion is important to me. Comments are appreciated.

## 2023-04-18 ENCOUNTER — Encounter (INDEPENDENT_AMBULATORY_CARE_PROVIDER_SITE_OTHER): Payer: Self-pay | Admitting: Family

## 2023-04-28 ENCOUNTER — Other Ambulatory Visit (INDEPENDENT_AMBULATORY_CARE_PROVIDER_SITE_OTHER): Payer: Self-pay | Admitting: Family

## 2023-04-28 ENCOUNTER — Other Ambulatory Visit (INDEPENDENT_AMBULATORY_CARE_PROVIDER_SITE_OTHER): Payer: Self-pay | Admitting: Pediatrics

## 2023-04-28 DIAGNOSIS — E063 Autoimmune thyroiditis: Secondary | ICD-10-CM

## 2023-04-30 ENCOUNTER — Ambulatory Visit (INDEPENDENT_AMBULATORY_CARE_PROVIDER_SITE_OTHER): Payer: PRIVATE HEALTH INSURANCE | Admitting: Family

## 2023-05-08 ENCOUNTER — Encounter (INDEPENDENT_AMBULATORY_CARE_PROVIDER_SITE_OTHER): Payer: Self-pay

## 2023-05-25 ENCOUNTER — Encounter (INDEPENDENT_AMBULATORY_CARE_PROVIDER_SITE_OTHER): Payer: Self-pay | Admitting: Pharmacy Technician

## 2023-05-25 ENCOUNTER — Other Ambulatory Visit (INDEPENDENT_AMBULATORY_CARE_PROVIDER_SITE_OTHER): Payer: Self-pay | Admitting: Pediatrics

## 2023-05-25 ENCOUNTER — Other Ambulatory Visit (INDEPENDENT_AMBULATORY_CARE_PROVIDER_SITE_OTHER): Payer: Self-pay | Admitting: Family

## 2023-05-25 ENCOUNTER — Other Ambulatory Visit (HOSPITAL_COMMUNITY): Payer: Self-pay

## 2023-05-25 ENCOUNTER — Telehealth (INDEPENDENT_AMBULATORY_CARE_PROVIDER_SITE_OTHER): Payer: Self-pay | Admitting: Pharmacy Technician

## 2023-05-25 DIAGNOSIS — G43009 Migraine without aura, not intractable, without status migrainosus: Secondary | ICD-10-CM

## 2023-05-25 MED ORDER — PREGABALIN 25 MG PO CAPS: 25.0000 mg | ORAL_CAPSULE | Freq: Two times a day (BID) | ORAL | 5 refills | Status: DC

## 2023-05-25 NOTE — Telephone Encounter (Signed)
 She had side effects pf nausea and anorexia on immediate release Topiramate . She has tolerated the extended release Topiramate  without side effects.

## 2023-05-25 NOTE — Telephone Encounter (Signed)
 error

## 2023-05-25 NOTE — Telephone Encounter (Signed)
 Pharmacy Patient Advocate Encounter   Received notification from CoverMyMeds that prior authorization for Topiramate  ER 50MG  er capsules is required/requested.   Insurance verification completed.   The patient is insured through [facility] .  Key: [id number]   Prior Authorization form/request asks a question that requires your assistance. Please see the question below and advise accordingly. The PA will not be submitted until the necessary information is received.

## 2023-05-26 ENCOUNTER — Other Ambulatory Visit (HOSPITAL_COMMUNITY): Payer: Self-pay

## 2023-05-26 NOTE — Telephone Encounter (Signed)
 Pharmacy Patient Advocate Encounter  Received notification from [employer] that Prior Authorization for Topiramate  ER 50MG  er capsules has been APPROVED from 05/26/2023 to 05/25/2024. Ran test claim, Copay is $150.00. This test claim was processed through [facility]- copay amounts may vary at other pharmacies due to pharmacy/plan contracts, or as the patient moves through the different stages of their insurance plan.   PA #/Case ID/Reference #:  [id number]

## 2023-05-26 NOTE — Telephone Encounter (Signed)
 Clinical questions have been answered and PA submitted. PA currently Pending. Please be advised that most companies allow up to 30 days to make a decision. We will advise when a determination has been made, or follow up in 1 week.   Please reach out to our team, Rx Prior Auth Pool, if you haven't heard back in a week.

## 2023-06-11 ENCOUNTER — Other Ambulatory Visit (HOSPITAL_COMMUNITY): Payer: Self-pay

## 2023-06-11 ENCOUNTER — Telehealth (INDEPENDENT_AMBULATORY_CARE_PROVIDER_SITE_OTHER): Payer: Self-pay | Admitting: Pharmacy Technician

## 2023-06-11 ENCOUNTER — Other Ambulatory Visit (INDEPENDENT_AMBULATORY_CARE_PROVIDER_SITE_OTHER): Payer: Self-pay | Admitting: Pediatrics

## 2023-06-11 NOTE — Telephone Encounter (Signed)
 Pharmacy Patient Advocate Encounter   Received notification from CoverMyMeds that prior authorization for Emgality  120MG /ML auto-injectors (migraine) is required/requested.   Insurance verification completed.   The patient is insured through [facility] MEDICAID .   Per test claim: PA required; PA submitted to above mentioned insurance via CoverMyMeds Key/confirmation #/EOC [id number] Status is pending

## 2023-06-11 NOTE — Telephone Encounter (Signed)
 Pharmacy Patient Advocate Encounter  Received notification from [facility] that Prior Authorization for Emgality  120MG /ML auto-injectors (migraine) has been DENIED.  Full denial letter will be uploaded to the media tab. See denial reason below.     PA #/Case ID/Reference #: [id number]

## 2023-06-12 ENCOUNTER — Telehealth (INDEPENDENT_AMBULATORY_CARE_PROVIDER_SITE_OTHER): Payer: Self-pay | Admitting: Pharmacy Technician

## 2023-06-12 ENCOUNTER — Other Ambulatory Visit (HOSPITAL_COMMUNITY): Payer: Self-pay

## 2023-06-12 NOTE — Telephone Encounter (Signed)
 Pharmacy Patient Advocate Encounter  Received notification from [facility] MEDICAID that Prior Authorization for Emgality  120MG /ML auto-injectors (migraine) has been APPROVED from 06/12/2023 to 06/11/2024. Ran test claim, Copay is $35.00. This test claim was processed through [facility]- copay amounts may vary at other pharmacies due to pharmacy/plan contracts, or as the patient moves through the different stages of their insurance plan.   PA #/Case ID/Reference #: [id number]

## 2023-06-12 NOTE — Telephone Encounter (Signed)
 PA request has been Submitted. New Encounter has been or will be created for follow up. For additional info see Pharmacy Prior Auth telephone encounter from 06/12/2023.

## 2023-06-12 NOTE — Telephone Encounter (Signed)
 Will do as soon as covermymeds is back up and running!

## 2023-06-12 NOTE — Telephone Encounter (Signed)
 The only insurance I have popping up for her is the [facility]. Does she have another primary insurance?  Also, covermymeds is back up and running. I am going to attempt another PA. If I can't then I will forward to my clinical pharmacist Amalia Badder to work on an appeal.

## 2023-06-12 NOTE — Telephone Encounter (Signed)
 Pharmacy Patient Advocate Encounter   Received notification from CoverMyMeds that prior authorization for Emgality  120MG /ML auto-injectors (migraine) is required/requested.   Insurance verification completed.   The patient is insured through [facility] MEDICAID .   Per test claim: PA required; PA submitted to above mentioned insurance via CoverMyMeds Key/confirmation #/EOC [id number] Status is pending

## 2023-06-12 NOTE — Telephone Encounter (Signed)
 You are right. She has regular [place] health care and medicaid. I hate that [facility] medicaid looks similar in our system. I was going to test claim it, but it looks like I am getting a refill too soon with both insurances. So I believe the pharmacy used both insurances.

## 2023-06-23 ENCOUNTER — Telehealth (INDEPENDENT_AMBULATORY_CARE_PROVIDER_SITE_OTHER): Payer: Self-pay | Admitting: Family

## 2023-06-23 ENCOUNTER — Encounter (INDEPENDENT_AMBULATORY_CARE_PROVIDER_SITE_OTHER): Payer: Self-pay | Admitting: Family

## 2023-06-23 NOTE — Progress Notes (Signed)
 Opened in error

## 2023-06-23 NOTE — Telephone Encounter (Signed)
 Rogue Clear RN with [facility] called while at patient's home to report that about 2 weeks ago, Laura Parrish had a very heavy menstrual period with big clots and since then has had low BP's and has been very fatigued. I recommended extra bag of IV fluid this week + increased salt intake. TG

## 2023-07-07 ENCOUNTER — Other Ambulatory Visit (INDEPENDENT_AMBULATORY_CARE_PROVIDER_SITE_OTHER): Payer: Self-pay | Admitting: Family

## 2023-07-07 DIAGNOSIS — Z931 Gastrostomy status: Secondary | ICD-10-CM

## 2023-07-07 DIAGNOSIS — K3184 Gastroparesis: Secondary | ICD-10-CM

## 2023-07-07 NOTE — Progress Notes (Signed)
 Previous GI provider left the practice. Needs new GI provider and requests [facility] provider. Referral placed.

## 2023-07-14 ENCOUNTER — Other Ambulatory Visit (INDEPENDENT_AMBULATORY_CARE_PROVIDER_SITE_OTHER): Payer: Self-pay | Admitting: Pediatrics

## 2023-07-20 ENCOUNTER — Other Ambulatory Visit (INDEPENDENT_AMBULATORY_CARE_PROVIDER_SITE_OTHER)

## 2023-07-20 ENCOUNTER — Ambulatory Visit: Admitting: Gastroenterology

## 2023-07-20 ENCOUNTER — Encounter: Payer: Self-pay | Admitting: Gastroenterology

## 2023-07-20 VITALS — BP 116/68 | HR 76 | Ht 64.0 in | Wt 127.0 lb

## 2023-07-20 DIAGNOSIS — R197 Diarrhea, unspecified: Secondary | ICD-10-CM

## 2023-07-20 DIAGNOSIS — R195 Other fecal abnormalities: Secondary | ICD-10-CM

## 2023-07-20 DIAGNOSIS — Z95828 Presence of other vascular implants and grafts: Secondary | ICD-10-CM

## 2023-07-20 DIAGNOSIS — Z862 Personal history of diseases of the blood and blood-forming organs and certain disorders involving the immune mechanism: Secondary | ICD-10-CM

## 2023-07-20 DIAGNOSIS — K219 Gastro-esophageal reflux disease without esophagitis: Secondary | ICD-10-CM | POA: Diagnosis not present

## 2023-07-20 DIAGNOSIS — G935 Compression of brain: Secondary | ICD-10-CM

## 2023-07-20 DIAGNOSIS — R194 Change in bowel habit: Secondary | ICD-10-CM | POA: Diagnosis not present

## 2023-07-20 DIAGNOSIS — R1031 Right lower quadrant pain: Secondary | ICD-10-CM | POA: Diagnosis not present

## 2023-07-20 DIAGNOSIS — E739 Lactose intolerance, unspecified: Secondary | ICD-10-CM

## 2023-07-20 DIAGNOSIS — T7800XA Anaphylactic reaction due to unspecified food, initial encounter: Secondary | ICD-10-CM

## 2023-07-20 DIAGNOSIS — Q796 Ehlers-Danlos syndrome, unspecified: Secondary | ICD-10-CM

## 2023-07-20 DIAGNOSIS — Z931 Gastrostomy status: Secondary | ICD-10-CM

## 2023-07-20 LAB — CBC WITH DIFFERENTIAL/PLATELET
Basophils Absolute: 0.1 K/uL (ref 0.0–0.1)
Basophils Relative: 0.9 % (ref 0.0–3.0)
Eosinophils Absolute: 0.3 K/uL (ref 0.0–0.7)
Eosinophils Relative: 4.2 % (ref 0.0–5.0)
HCT: 44.9 % (ref 36.0–46.0)
Hemoglobin: 15.3 g/dL — ABNORMAL HIGH (ref 12.0–15.0)
Lymphocytes Relative: 30.7 % (ref 12.0–46.0)
Lymphs Abs: 2.5 K/uL (ref 0.7–4.0)
MCHC: 34 g/dL (ref 30.0–36.0)
MCV: 84.8 fl (ref 78.0–100.0)
Monocytes Absolute: 0.5 K/uL (ref 0.1–1.0)
Monocytes Relative: 6 % (ref 3.0–12.0)
Neutro Abs: 4.7 K/uL (ref 1.4–7.7)
Neutrophils Relative %: 58.2 % (ref 43.0–77.0)
Platelets: 299 K/uL (ref 150.0–400.0)
RBC: 5.29 Mil/uL — ABNORMAL HIGH (ref 3.87–5.11)
RDW: 12.7 % (ref 11.5–15.5)
WBC: 8.1 K/uL (ref 4.0–10.5)

## 2023-07-20 LAB — IBC + FERRITIN
Ferritin: 52.5 ng/mL (ref 10.0–291.0)
Iron: 104 ug/dL (ref 42–145)
Saturation Ratios: 29.4 % (ref 20.0–50.0)
TIBC: 354.2 ug/dL (ref 250.0–450.0)
Transferrin: 253 mg/dL (ref 212.0–360.0)

## 2023-07-20 LAB — COMPREHENSIVE METABOLIC PANEL WITH GFR
ALT: 11 U/L (ref 0–35)
AST: 16 U/L (ref 0–37)
Albumin: 5.4 g/dL — ABNORMAL HIGH (ref 3.5–5.2)
Alkaline Phosphatase: 88 U/L (ref 39–117)
BUN: 10 mg/dL (ref 6–23)
CO2: 23 meq/L (ref 19–32)
Calcium: 10.1 mg/dL (ref 8.4–10.5)
Chloride: 106 meq/L (ref 96–112)
Creatinine, Ser: 0.77 mg/dL (ref 0.40–1.20)
GFR: 108.17 mL/min (ref >60.00)
Glucose, Bld: 89 mg/dL (ref 70–99)
Potassium: 3.8 meq/L (ref 3.5–5.1)
Sodium: 139 meq/L (ref 135–145)
Total Bilirubin: 0.9 mg/dL (ref 0.2–1.2)
Total Protein: 8.3 g/dL (ref 6.0–8.3)

## 2023-07-20 LAB — VITAMIN B12: Vitamin B-12: 481 pg/mL (ref 211–911)

## 2023-07-20 LAB — FOLATE: Folate: >23.4 ng/mL (ref >5.9)

## 2023-07-20 LAB — HIGH SENSITIVITY CRP: CRP, High Sensitivity: 0.6 mg/L (ref 0.000–5.000)

## 2023-07-20 LAB — SEDIMENTATION RATE: Sed Rate: 4 mm/h (ref 0–20)

## 2023-07-20 LAB — LIPASE: Lipase: 28 U/L (ref 11.0–59.0)

## 2023-07-20 NOTE — Progress Notes (Unsigned)
 Laura Parrish

## 2023-07-20 NOTE — Progress Notes (Signed)
 Laura Parrish [id number] [birth year]   Chief Complaint: Abdominal pain  Referring Provider: Marianna City, NP Primary GI MD: Sampson (previously seen by Dr. Therisa at [facility])  HPI: Laura Parrish is a 23 y.o. 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    12/23/2021 patient seen by Dr. Therisa at [facility] to establish care for G-tube management.  History of Chiari I malformation, syringomyelia, mitochondrial disease, alpha gal hypersensitivity.  Patient receives infusion through her port for mitochondrial disease.  G-tube replaced 09/18/2021 with a 14 French inflatable tube.  Also suffers from chronic gastroparesis and has learned to make dietary adjustments.  Initial G-tube placement 2010. Does maintain oral nutrition but supplements with G-tube feeding. G-tube exchanged 06/03/2023 with [facility], on 33-month follow up schedule.   Patient here today with her mother. States she has been having right sided abdominal pain since Memorial Day.  Pain came on suddenly, not gradually.  States that it is primarily an achy pain, but can become sharp/burning.  States it is worse with laying flat, and she has been unable to lay flat in the last couple of months due to pain.    States that she has tried everything she can think of to help the pain.  Has been eating a very bland diet.  Has tried food elimination.  She has been avoiding caffeine.  She has been feeling nauseated and has been taking nausea medication daily.  Denies any vomiting.  She has also had a change in her bowel habits, has been having diarrhea which is a change from her normal constipation.  She has not been taking Imodium as this tends to cause abdominal cramping. She is currently  having 3 bowel movements daily which are loose but not watery.  This tends to occur after eating and can happen as quickly as 15 to 30 minutes after meals.  She has associated fecal urgency but denies any incontinence.  She denies any blood in her stool or melena.  She has history of GERD and gastroparesis and is on rabeprazole  twice daily.  States she was previously on lansoprazole but rabeprazole  works better for her.  Even on this medication she does still have breakthrough symptoms of acid reflux and heartburn about twice a week.  She has history of cholecystectomy in 2015.  States she had her G-tube moved to a different location at the time of that surgery.  She is having worsening right-sided abdominal pain with G-tube feedings.  Pain seems to be better with normal eating by mouth.  Her mother states patient had EGD/colonoscopy in 2015, right before her cholecystectomy.  They report colonoscopy was normal.  States that there were no stones in the gallbladder but it was not functioning appropriately and was found to be diseased when removed.  Patient reports she has previously tested negative for celiac disease.  Denies any known history of H. pylori infection.  States that she took Xifaxan  a couple years ago for empiric treatment of suspected SIBO but it did not help.  Patient has been feeling fatigued.  Reports temperature was elevated mildly to 99.8 yesterday.  She has had subjective fever and chills.  Patient has been taking ibuprofen  occasionally, once or twice a week.  She reports history of iron  deficiency anemia and is taking Vitron-C.  Denies any alcohol use or smoking.  States her weight is stable.  Previous GI Procedures/Imaging   CT A/P 07/19/2021 1.  Normal appendix.  No evidence of colitis or diverticulitis. 2.  Large amount of retained colonic stool suggesting constipation. 3.  Percutaneous gastrostomy tube in place.  Past Medical History:  Diagnosis Date   Allergy  to  alpha-gal    elevated IgE 04/10/16   Autonomic dysfunction    Chiari I malformation (HCC)    Complication of anesthesia    slow to wake up   Dyspnea    Eczema    GERD (gastroesophageal reflux disease)    Headache    Heart murmur    Hypoglycemia    Hypothyroid    IBS (irritable bowel syndrome)    Joint pain    Mitochondrial disease (HCC)    Muscle pain    Pneumonia    several times   PONV (postoperative nausea and vomiting)     Past Surgical History:  Procedure Laterality Date   ADENOIDECTOMY     2004or 2005   chiari decompression  2007   GALLBLADDER SURGERY  2015   GASTROSTOMY TUBE PLACEMENT     x 2   IR CV LINE INJECTION  07/06/2020   MUSCLE BIOPSY  2010   Port Replacement  05/2018   x 2 orignal 08/2016 then replacement 05/2018   PORT-A-CATH REMOVAL N/A 03/24/2019   Procedure: PORT-A-CATH REMOVAL;  Surgeon: Stevie Herlene Righter, MD;  Location: [facility] OR;  Service: General;  Laterality: N/A;   PORTACATH PLACEMENT Right 03/24/2019   Procedure: PORT-A-CATH INSERTION under ultrasound and fluoroscopic guidance;  Surgeon: Stevie Herlene Righter, MD;  Location: [facility] OR;  Service: General;  Laterality: Right;   REMOVAL OF GASTROSTOMY TUBE N/A 03/24/2019   Procedure: Exchange of Gastrostomy Tube;  Surgeon: Stevie Herlene Righter, MD;  Location: [facility] OR;  Service: General;  Laterality: N/A;   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 18 g 0   ascorbic acid (VITAMIN C ) 500 MG tablet Take 500 mg by mouth daily. (Patient not taking: Reported on 04/14/2023)     azelastine  (ASTELIN ) 0.1 % nasal spray Place 2 sprays into both  nostrils 2 (two) times daily. Use in each nostril as directed 30 mL 5   budesonide  (PULMICORT ) 0.5 MG/2ML nebulizer solution Start at onset of respiratory illness-use 1 vial twice daily inhaled via nebulizer for 2 weeks or until symptoms resolve. 2 mL 5   cetirizine  (ZYRTEC ) 10 MG tablet TAKE 1 TABLET BY MOUTH EVERY MORNING & TAKE 1 TABLET AT BEDTIME 60 tablet 3   clobetasol  (OLUX ) 0.05 % topical foam Apply topically 2 (two) times daily. Apply to affected areas twice daily as needed taking care to avoid axillae and groin area. (Patient not taking: Reported on 04/14/2023) 45 g 3   Coenzyme Q-10 200 MG CAPS Take 1 capsule by mouth daily.     Continuous Blood Gluc Receiver (DEXCOM G6 RECEIVER) DEVI 1 Device by Does not apply route as directed. (Patient not taking: Reported on 09/19/2022) 1 each 2   Continuous Blood Gluc Sensor (DEXCOM G6  SENSOR) MISC APPLY 1 SENSOR INTO THE SKIN AS DIRECTED(CHANGE SENSOR EVERY 10 DAYS) (Patient not taking: Reported on 04/14/2023) 3 each 11   Continuous Blood Gluc Transmit (DEXCOM G6 TRANSMITTER) MISC INJECT 1 DEVICE INTO THE SKIN AS DIRECTED (REUSE UP TO 8 TIMES WITH EACH NEW SENSOR) (Patient not taking: Reported on 04/14/2023) 1 each 3   Crisaborole  (EUCRISA ) 2 % OINT Apply 1 Application topically 2 (two) times daily as needed. Good option for face. (Patient not taking: Reported on 09/19/2022) 100 g 3   cromolyn  (GASTROCROM ) 100 MG/5ML solution Take 10 mLs (200 mg total) by mouth 4 (four) times daily -  before meals and at bedtime. (Patient not taking: Reported on 04/14/2023) 480 mL 5   EMGALITY  120 MG/ML SOAJ Inject 120mg  subcutaneously every 28 days as maintenance dose 1.12 mL 5   EPINEPHrine  0.3 mg/0.3 mL IJ SOAJ injection Inject 0.3 mg into the muscle as needed for anaphylaxis. As needed (Patient not taking: Reported on 04/14/2023) 4 each 1   Glucagon  (BAQSIMI  TWO PACK) 3 MG/DOSE POWD Place 1 spray into the nose as directed. 2 each 3   hydrocortisone  2.5 % ointment Apply topically  twice daily as need to red sandpapery rash. 30 g 3   Iron -Vitamin C  65-125 MG TABS Take 1 tablet by mouth daily.     leucovorin (WELLCOVORIN) 25 MG tablet TAKE 1 TABLET BY MOUTH 2 TIMES A DAY 180 tablet 2   levOCARNitine  (CARNITOR ) 1 GM/10ML solution TAKE 2 TEASPOONFUL ( ) BY MOUTH TWICEDAILY 118 mL 12   levocetirizine (XYZAL ) 5 MG tablet Take by mouth. (Patient not taking: Reported on 04/14/2023)     levonorgestrel-ethinyl estradiol (SEASONALE) 0.15-0.03 MG tablet Take 1 tablet by mouth daily.     levothyroxine  (SYNTHROID ) 88 MCG tablet TAKE 1 TABLET BY MOUTH EVERY MORNING ON AN EMPTY STOMACH 90 tablet 2   lidocaine  (XYLOCAINE ) 5 % ointment Apply 1 application topically as needed. 35.44 g 3   lidocaine  4 % Place 2 patches onto the skin daily. (Patient not taking: Reported on 04/14/2023) 60 patch 5   metaxalone  (SKELAXIN ) 800 MG tablet TAKE 1/2 TABLET (400MG  TOTAL) BY MOUTH DAILY AS NEEDED (MUSCLE PAIN) (Patient not taking: Reported on 04/14/2023) 30 tablet 3   montelukast  (SINGULAIR ) 10 MG tablet TAKE 1 TABLET BY MOUTH AT BEDTIME 30 tablet 5   Nutritional Supplements (NUTRITIONAL SUPPLEMENT PLUS) LIQD 3 cartons (711 mL) of Ensure Clear given via gtube daily. 77958 mL 12   Nutritional Supplements (NUTRITIONAL SUPPLEMENT PLUS) LIQD 237-474 mL (1-2 cartons) Boost Breeze (peach flavored only) given po daily. 14694 mL 12   Nutritional Supplements (NUTRITIONAL SUPPLEMENT PLUS) LIQD 30 mL Prostat mixed with 30-60 mL water flush given via gtube daily. (Patient not taking: Reported on 04/14/2023) 930 mL 12   Olopatadine  HCl (PATADAY ) 0.2 % SOLN Place 1 drop into both eyes daily as needed. 2.5 mL 5   ondansetron  (ZOFRAN -ODT) 4 MG disintegrating tablet Take 1 tablet under the tongue every 6-8 hours as needed for nausea. 30 tablet 5   polyethylene glycol powder (GLYCOLAX /MIRALAX ) 17 GM/SCOOP powder 17 g or one capful in G-Tube. 255 g 3   pregabalin  (LYRICA ) 25 MG capsule Take 1 capsule (25 mg total) by mouth 2  (two) times daily. 60 capsule 5   pregabalin  (LYRICA ) 75 MG capsule Take 2 capsules (150 mg total) by mouth at bedtime. 60 capsule 5   promethazine  (PHENERGAN ) 25 MG tablet Take 1 tablet at onset of migraine. May repeat every 6 hours  PRN (Patient not taking: Reported on 04/14/2023) 30 tablet 0   pyridostigmine  (MESTINON ) 60 MG tablet TAKE 1 TABLET BY MOUTH EACH MORNING, AT NOON, IN THE EVENING, AND AT BEDTIME 120 tablet 2   RABEprazole  (ACIPHEX ) 20 MG tablet TAKE 1 TABLET BY MOUTH 2 TIMES A DAY 60 tablet 0   Rimegepant Sulfate (NURTEC) 75 MG TBDP Take 1 tablet (75 mg total) by mouth as needed (at onset of migraine). (Patient not taking: Reported on 09/19/2022) 30 tablet 3   rizatriptan  (MAXALT ) 10 MG tablet TAKE 1 TABLET BY MOUTH AS NEEDED FOR MIGRAINE. MAY REPEAT IN 2 HOURS IF NEEDED. 12 tablet 3   SLYND 4 MG TABS Take 1 tablet by mouth daily.     topiramate  (TOPAMAX ) 25 MG tablet Take 1 tablet (25 mg total) by mouth daily after breakfast. 30 tablet 5   Topiramate  ER (TROKENDI  XR) 50 MG CP24 TAKE 1 CAPSULE BY MOUTH DAILY AT BEDTIME 30 capsule 5   traZODone  (DESYREL ) 50 MG tablet TAKE 1/2 TABLET BY MOUTH 30 MINUTES PRIOR TO BEDTIME. IF NEEDED, MAY INCREASE TO 1 TABLET BEFORE BEDTIME 30 tablet 1   vitamin E 200 UNIT capsule Take 200 Units by mouth daily.     Current Facility-Administered Medications  Medication Dose Route Frequency Provider Last Rate Last Admin   Galcanezumab -gnlm SOAJ 240 mg  240 mg Subcutaneous Once         Allergies as of 07/20/2023 - Review Complete 04/18/2023  Allergen Reaction Noted   Other Anaphylaxis 03/29/2013   Latex Rash 04/08/2012   Lactated ringers  05/28/2018   Propofol Other (See Comments) 05/17/2012   Tape Itching and Other (See Comments) 05/17/2012    Family History  Problem Relation Age of Onset   Asthma Mother    Allergic rhinitis Mother    Heart murmur Mother    Allergic rhinitis Sister    Thyroid  disease Sister    Allergic rhinitis Brother    Asthma  Brother    Seizures Brother    Mitochondrial disorder Brother    Allergic rhinitis Maternal Grandmother    Asthma Maternal Grandmother    Diabetes type II Maternal Grandmother    Lung cancer Maternal Grandfather    Angioedema Neg Hx    Eczema Neg Hx    Immunodeficiency Neg Hx    Urticaria Neg Hx     Social History   Tobacco Use   Smoking status: Never    Passive exposure: Never   Smokeless tobacco: Never  Vaping Use   Vaping status: Never Used  Substance Use Topics   Alcohol use: No   Drug use: No     Review of Systems:    Constitutional: No weight loss.  Subjective fever/chills, fatigue. Eyes: No change in vision Ears, Nose, Throat:  No change in hearing or congestion Skin: No rash or itching Cardiovascular: Positive chest pain Respiratory: Positive shortness of breath.  No cough Gastrointestinal: See HPI and otherwise negative Genitourinary: No dysuria or change in urinary frequency Neurological: No headache, dizziness or syncope Musculoskeletal: No new muscle or joint pain Hematologic: No bleeding or bruising    Physical Exam:  Vital signs: BP 116/68   Pulse 76   Ht 5' 4 (1.626 m)   Wt 127 lb (57.6 kg)   BMI 21.80 kg/m    Constitutional: NAD, Well developed, Well nourished, alert and cooperative Head:  Normocephalic and atraumatic.  Eyes: No scleral icterus.  Mouth: No oral lesions. Respiratory: Respirations even and unlabored. Lungs clear  to auscultation bilaterally.  No wheezes, crackles, or rhonchi.  Cardiovascular:  Regular rate and rhythm. No murmurs. No peripheral edema. Gastrointestinal:  Soft, nondistended, tender to palpation of RLQ. No rebound or guarding. Normal bowel sounds. No appreciable masses or hepatomegaly.  G-tube in place with no surrounding erythema or evidence of infection. Rectal:  Not performed.  Neurologic:  Alert and oriented x4;  grossly normal neurologically.  Skin:   Dry and intact without significant lesions or  rashes. Psychiatric: Oriented to person, place and time. Demonstrates good judgement and reason without abnormal affect or behaviors.   RELEVANT LABS AND IMAGING: CBC    Component Value Date/Time   WBC 7.2 06/16/2022 1430   WBC 6.1 05/08/2022 1222   RBC 5.13 06/16/2022 1430   RBC 5.10 05/08/2022 1222   HGB 14.6 06/16/2022 1430   HCT 43.9 06/16/2022 1430   PLT 334 05/08/2022 1222   MCV 86 06/16/2022 1430   MCH 28.5 06/16/2022 1430   MCH 28.2 05/08/2022 1222   MCHC 33.3 06/16/2022 1430   MCHC 33.2 05/08/2022 1222   RDW 12.7 06/16/2022 1430   LYMPHSABS 3.1 06/16/2022 1430   MONOABS 0.5 07/19/2021 1329   EOSABS 0.3 06/16/2022 1430   BASOSABS 0.1 06/16/2022 1430    CMP     Component Value Date/Time   NA 140 05/08/2022 1222   K 4.2 05/08/2022 1222   CL 107 05/08/2022 1222   CO2 19 (L) 05/08/2022 1222   GLUCOSE 84 05/08/2022 1222   BUN 11 05/08/2022 1222   CREATININE 0.79 05/08/2022 1222   CALCIUM 10.0 05/08/2022 1222   PROT 7.8 05/08/2022 1222   ALBUMIN 4.1 07/19/2021 1329   AST 16 05/08/2022 1222   ALT 9 05/08/2022 1222   ALKPHOS 64 07/19/2021 1329   BILITOT 1.3 (H) 05/08/2022 1222   GFRNONAA >60 07/19/2021 1329   GFRAA >60 03/24/2019 1332     Assessment/Plan:   Right lower quadrant abdominal pain Change in bowel habits Loose stools GERD History of iron  deficiency anemia Feeding by G-tube - supplemental Patient with past medical history of allergy  to alpha gal, lactose intolerance, Chiari I malformation, Ehlers Danlos syndrome, GERD, hypothyroidism, IBS, mitochondrial disease, G-tube in place, gastroparesis who presents today for evaluation of abdominal pain.   Onset of pain 1 to 2 months ago, localized to right side of abdomen and is tender to palpation of RLQ today.  Pain worse with G-tube feedings, better with feeding by mouth.  She is on PPI twice daily with breakthrough symptoms of acid reflux about twice a week. Also has noted change in her bowel habits,  having loose stools which occur shortly after eating and with associated fecal urgency.  Denies rectal bleeding or melena. Possible dumping syndrome, patient does have history of this per her mother. She has had subjective fever/chills and has been feeling generally unwell, more fatigued than normal.  Recent TSH was normal. She has history of EGD/colonoscopy done in 2015. G-tube most recently replaced 05/2023, follows with [facility].  - Will order CT A/P for further evaluation of abdominal pain  - Check labs today: CBC, CMP, lipase, CRP, ESR, iron /ferritin, vitamin B12, folate, vitamin D  - Check stool studies: fecal calprotectin, stool culture, C. difficile, Diatherix H. pylori - Continue rabeprazole  20 mg twice daily - Consider endoscopic procedures, possibly would need to be done in hospital setting. - Will discuss further recommendations with Dr. Wilhelmenia Camie Furbish, PA-C [facility] 07/20/2023, 12:31 PM  Patient Care Team: Waddell Krabbe  CHRISTELLA, MD as PCP - Pediatrics (Neurology) Marianna City, NP as Nurse Practitioner (Neurology) Dejong, Rosalynn Rinks, MD as Pediatrician (Pediatrics) Waddell Corean CHRISTELLA, MD as Consulting Physician (Pediatrics) Algernon Crank, RD (Inactive) as Dietitian (Dietician) Dolphus Reiter, MD as Consulting Physician (Rheumatology) Verdon Keen, MD as Consulting Physician (Obstetrics and Gynecology)

## 2023-07-20 NOTE — Patient Instructions (Signed)
 Continue reflux medication.   Your provider has requested that you go to the basement level for lab work before leaving today. Press B on the elevator. The lab is located at the first door on the left as you exit the elevator.  Your provider has ordered Diatherix stool testing for you. You have received a kit from our office today containing all necessary supplies to complete this test. Please carefully read the stool collection instructions provided in the kit before opening the accompanying materials. In addition, be sure there is a label providing your full name and date of birth on the puritan opti-swab tube that is supplied in the kit (if you do not see a label with this information on your test tube, please make us  aware before test collection!). After completing the test, you should secure the purtian tube into the specimen biohazard bag. The [facility] E-Req sheet (including date and time of specimen collection) should be placed into the outside pocket of the specimen biohazard bag and returned to the [place] lab (basement floor of [facility]) within 3 days of collection. Please make sure to give the specimen to a staff member at the lab. DO NOT leave the specimen on the counter.   If the specimen date and time (can be found in the upper right boxed portion of the sheet) are not filled out on the E-Req sheet, the test will NOT be performed.   You have been scheduled for a CT scan of the abdomen and pelvis at [facility] (check in through the emergency department) .You are scheduled on 07/28/23 at 5:30pm. You should arrive 15 minutes prior to your appointment time for registration through the emergency department. Please follow the written instructions below on the day of your exam:   1) Do not eat anything after 1:30pm (4 hours prior to your test)     You may take any medications as prescribed with a small amount of water, if necessary. If you  take any of the following medications: METFORMIN, GLUCOPHAGE, GLUCOVANCE, AVANDAMET, RIOMET, FORTAMET, ACTOPLUS MET, JANUMET, GLUMETZA or METAGLIP, you MAY be asked to HOLD this medication 48 hours AFTER the exam.   The purpose of you drinking the oral contrast is to aid in the visualization of your intestinal tract. The contrast solution may cause some diarrhea. Depending on your individual set of symptoms, you may also receive an intravenous injection of x-ray contrast/dye. Plan on being at [facility] for 45 minutes or longer, depending on the type of exam you are having performed.   If you have any questions regarding your exam or if you need to reschedule, you may call [facility] at [phone] between the hours of 8:00 am and 5:00 pm, Monday-Friday.   _______________________________________________________  If your blood pressure at your visit was 140/90 or greater, please contact your primary care physician to follow up on this.  _______________________________________________________  If you are age 14 or older, your body mass index should be between 23-30. Your Body mass index is 21.8 kg/m. If this is out of the aforementioned range listed, please consider follow up with your Primary Care Provider.  If you are age 66 or younger, your body mass index should be between 19-25. Your Body mass index is 21.8 kg/m. If this is out of the aformentioned range listed, please consider follow up with your Primary Care Provider.   ________________________________________________________  The [facility] providers would like to encourage you to use  MYCHART to communicate with providers for non-urgent requests or questions.  Due to long hold times on the telephone, sending your provider a message by [facility] may be a faster and more efficient way to get a response.  Please allow 48 business hours for a response.  Please remember that this is for non-urgent requests.   _______________________________________________________

## 2023-07-22 NOTE — Progress Notes (Signed)
 Attending Physician's Attestation   I have reviewed the chart.   I agree with the Advanced Practitioner's note, impression, and recommendations with any updates as below. Extensive workup is outlined and reasonable.  Will see if we can add on tissue TTG IgA and IgA level as well. If patient is up for EGD/colonoscopy, would set these up in the hospital-based outpatient setting.  See results of CT scan, then can move forward with scheduling and hospital-based outpatient EGD/colonoscopy. Would also consider SIBO breath testing for this individual especially while waiting for EGD/colonoscopy since it will be likely September or October for her to be scheduled. Her complexity is such that she should not be scheduled with other providers on their hospital time.   Aloha Finner, MD [facility] Advanced Endoscopy Office # [phone]

## 2023-07-23 ENCOUNTER — Ambulatory Visit

## 2023-07-23 ENCOUNTER — Ambulatory Visit: Payer: Self-pay | Admitting: Gastroenterology

## 2023-07-23 DIAGNOSIS — R194 Change in bowel habit: Secondary | ICD-10-CM

## 2023-07-23 DIAGNOSIS — R1031 Right lower quadrant pain: Secondary | ICD-10-CM

## 2023-07-23 DIAGNOSIS — Z862 Personal history of diseases of the blood and blood-forming organs and certain disorders involving the immune mechanism: Secondary | ICD-10-CM

## 2023-07-23 DIAGNOSIS — K219 Gastro-esophageal reflux disease without esophagitis: Secondary | ICD-10-CM

## 2023-07-23 DIAGNOSIS — R195 Other fecal abnormalities: Secondary | ICD-10-CM

## 2023-07-24 ENCOUNTER — Other Ambulatory Visit (INDEPENDENT_AMBULATORY_CARE_PROVIDER_SITE_OTHER): Payer: Self-pay | Admitting: Pediatrics

## 2023-07-24 LAB — CLOSTRIDIUM DIFFICILE BY PCR: Toxigenic C. Difficile by PCR: NEGATIVE

## 2023-07-25 LAB — CALPROTECTIN, FECAL: Calprotectin, Fecal: 23 ug/g (ref 0–120)

## 2023-07-28 ENCOUNTER — Other Ambulatory Visit (INDEPENDENT_AMBULATORY_CARE_PROVIDER_SITE_OTHER): Payer: Self-pay | Admitting: Family

## 2023-07-28 ENCOUNTER — Ambulatory Visit (HOSPITAL_COMMUNITY)

## 2023-07-28 DIAGNOSIS — G43009 Migraine without aura, not intractable, without status migrainosus: Secondary | ICD-10-CM

## 2023-07-28 LAB — STOOL CULTURE: E coli, Shiga toxin Assay: NEGATIVE

## 2023-07-28 MED ORDER — TOPIRAMATE 25 MG PO TABS: 25.0000 mg | ORAL_TABLET | Freq: Every day | ORAL | 5 refills | Status: AC

## 2023-08-05 ENCOUNTER — Ambulatory Visit (HOSPITAL_COMMUNITY)
Admission: RE | Admit: 2023-08-05 | Discharge: 2023-08-05 | Disposition: A | Discharge status: Home / Self Care | Source: Ambulatory Visit | Attending: Gastroenterology | Admitting: Gastroenterology

## 2023-08-05 ENCOUNTER — Encounter (HOSPITAL_COMMUNITY): Payer: Self-pay

## 2023-08-05 ENCOUNTER — Telehealth: Payer: Self-pay | Admitting: Gastroenterology

## 2023-08-05 DIAGNOSIS — R195 Other fecal abnormalities: Secondary | ICD-10-CM | POA: Insufficient documentation

## 2023-08-05 DIAGNOSIS — R194 Change in bowel habit: Secondary | ICD-10-CM | POA: Insufficient documentation

## 2023-08-05 DIAGNOSIS — R1031 Right lower quadrant pain: Secondary | ICD-10-CM | POA: Diagnosis present

## 2023-08-05 MED ORDER — IOHEXOL 300 MG/ML  SOLN
100.0000 mL | Freq: Once | INTRAMUSCULAR | Status: AC | PRN
  Administered 2023-08-05: 100 mL via INTRAVENOUS

## 2023-08-05 NOTE — Telephone Encounter (Signed)
 Patient notified of H. Pylori results via MyChart message.

## 2023-08-05 NOTE — Telephone Encounter (Signed)
 Diatherix H. pylori results received.  Negative for H. pylori 07/24/2023.

## 2023-08-12 ENCOUNTER — Other Ambulatory Visit (INDEPENDENT_AMBULATORY_CARE_PROVIDER_SITE_OTHER): Payer: Self-pay | Admitting: Pediatrics

## 2023-08-13 ENCOUNTER — Telehealth (INDEPENDENT_AMBULATORY_CARE_PROVIDER_SITE_OTHER): Payer: Self-pay | Admitting: Family

## 2023-08-13 NOTE — Telephone Encounter (Signed)
  Name of who is calling: [facility]   Caller's Relationship to Patient:   Best contact number: [phone] (lvm ask for t[facility]ya)  Provider they see: [place]   Reason for call: calling for last visit notes fax number [phone]     PRESCRIPTION REFILL ONLY  Name of prescription:  Pharmacy:

## 2023-08-13 NOTE — Telephone Encounter (Addendum)
 Last office note faxed to the number provided.   SS, CCMA

## 2023-09-09 ENCOUNTER — Telehealth (INDEPENDENT_AMBULATORY_CARE_PROVIDER_SITE_OTHER): Payer: Self-pay

## 2023-09-09 ENCOUNTER — Other Ambulatory Visit (HOSPITAL_COMMUNITY): Payer: Self-pay

## 2023-09-09 ENCOUNTER — Telehealth (INDEPENDENT_AMBULATORY_CARE_PROVIDER_SITE_OTHER): Payer: Self-pay | Admitting: Pharmacy Technician

## 2023-09-09 NOTE — Telephone Encounter (Signed)
 Received a Fax from pharmacy asking for a PA to be submitted.   SS, CCMA

## 2023-09-09 NOTE — Telephone Encounter (Signed)
 Pharmacy Patient Advocate Encounter   Received notification from Fax that prior authorization for Emgality  120 MG/ML Soaj is required/requested.   Patient has [employer] primary and [employer] secondary. Both insurances need to be billed and you received a $0.00. No PA needed.  Called the pharmacy and the pharmacist received a paid claim of $0.00 after billing both insurances.

## 2023-09-09 NOTE — Telephone Encounter (Signed)
 Thank you! I have hers too already, but having issues with [employer]. I am in the middle of talking to IT right now trying to fix it. If not resolved soon then I will send the paper copy.

## 2023-09-23 ENCOUNTER — Ambulatory Visit: Admitting: Gastroenterology

## 2023-09-23 ENCOUNTER — Other Ambulatory Visit (INDEPENDENT_AMBULATORY_CARE_PROVIDER_SITE_OTHER): Payer: Self-pay | Admitting: Pediatrics

## 2023-09-29 ENCOUNTER — Ambulatory Visit: Payer: Self-pay | Admitting: Surgery

## 2023-09-29 NOTE — H&P (Signed)
 Subjective:   CC: Endometriosis [N80.9]  HPI:  referred by Heather Johnathan Baumgartner* for evaluation of above.   History of Present Illness Laura Parrish is a 23 year old female who presents for a prophylactic appendectomy.  She experiences right lower quadrant abdominal pain for six months, described as dull, alternating with sharp and stinging, constant, and rated 5 to 7, peaking at 8. The pain worsens with walking. She denies loss of appetite, nausea, vomiting, or diarrhea. Ibuprofen  and Tylenol  have not provided relief. She has allergies to propofol, lactated ringers, and latex.    Past Medical History:  has a past medical history of Chiari I malformation (CMS/HHS-HCC), Chronic pain, Clubfoot, congenital, Development delay, Dysmenorrhea, Feeding by G-tube (CMS/HHS-HCC), Feeding intolerance, Headache(784.0), Menorrhagia, Myalgia, Myasthenia (CMS/HHS-HCC), Paresthesia, Premature birth (HHS-HCC), and Tethered cord (CMS/HHS-HCC).  Past Surgical History:  has a past surgical history that includes chiari repair; achilles tendon clip on right; tethered cord repair; Tonsillectomy & Adenoidectomy; Muscle Biopsy; esophagogastrodoudenoscopy w/biopsy (N/A, 11/06/2014); and colonoscopy w/biopsy (N/A, 11/06/2014).  Family History: family history includes Arthritis in her maternal grandmother; Migraines in her mother; Mitochondrial disorder in her brother; Refractive error in her mother.  Social History:  reports that she has never smoked. She has never used smokeless tobacco. She reports that she does not drink alcohol and does not use drugs.  Current Medications: has a current medication list which includes the following prescription(s): *albuterol  sulfate inhalation, *sennosides oral, *vitamin e oral capsule 200 unit capsule 200 unit, albuterol , ascorbic acid (vitamin c ), blood glucose diagnostic, clobetasol , co-enzyme q-10 (ubiquinone), slynd, epinephrine , feeding container and pump set, fluticasone   propionate, fluticasone  propionate, food supplemt, lactose-reduced, galcanezumab -gnlm, baqsimi , leucovorin, levothyroxine , lidocaine -prilocaine, metaxalone , metaxalone , methylphenidate  hcl, miscellaneous medical supply, ondansetron , polyethylene glycol, pregabalin , pregabalin , promethazine , pyridostigmine , rabeprazole , rizatriptan , rizatriptan , topiramate , topiramate , trazodone , and vit b complex 100 combo no.2.  Allergies:  Allergies  Allergen Reactions   Latex Rash   Adhesive Itching   Lactated Ringers Other (See Comments)    Contraindication with mitochondrial disorder Contraindicated w/ mitochondrial syndrome Contraindicated w/ mitochondrial syndrome    Other Rash    Adhesives    Others Unknown    Uncoded Allergy . Allergen: PROPROFOL   Propofol Analogues Other (See Comments)    Due to possible mitochondrial disorder Due to possible mitochondrial disorder Due to possible mitochondrial disorder Contraindicated with mitochondrial disorder Due to possible mitochondrial disorder Due to possible mitochondrial disorder    Adhesive Tape-Silicones Itching    ROS:  A 15 point review of systems was performed and pertinent positives and negatives noted in HPI   Objective:     BP 107/73   Pulse 61   Ht 162.6 cm (5' 4)   Wt 56.7 kg (125 lb)   LMP 06/28/2023   BMI 21.46 kg/m   Constitutional :  No distress, cooperative, alert  Lymphatics/Throat:  Supple with no lymphadenopathy  Respiratory:  Clear to auscultation bilaterally  Cardiovascular:  Regular rate and rhythm  Gastrointestinal: Soft, non-tender, non-distended, no organomegaly.  Musculoskeletal: Steady gait and movement  Skin: Cool and moist  Psychiatric: Normal affect, non-agitated, not confused         LABS:  N/a   RADS: N/a  Assessment:      Endometriosis [N80.9] Mitochondrial disease- and associated complications.  No hx of anesthesia complications but needs to avoid LR, and also reports requiring D5  infusion night prior since patient cannot fast.  Mother stated they can arrange on own.  Plan:     1. Endometriosis [N80.9]  Discussed prophylactic appendectomy. Alternatives include continued observation.  Benefits include prevention of appendicitis.  The patient verbalized understanding and all questions were answered to the patient's satisfaction.  2. Patient has elected to proceed with surgical treatment. Procedure will be scheduled, same time with endometrial surgery   labs/images/medications/previous chart entries reviewed personally and relevant changes/updates noted above.

## 2023-09-30 ENCOUNTER — Encounter (INDEPENDENT_AMBULATORY_CARE_PROVIDER_SITE_OTHER): Payer: Self-pay

## 2023-10-01 ENCOUNTER — Encounter (INDEPENDENT_AMBULATORY_CARE_PROVIDER_SITE_OTHER): Payer: Self-pay

## 2023-10-29 ENCOUNTER — Other Ambulatory Visit (INDEPENDENT_AMBULATORY_CARE_PROVIDER_SITE_OTHER): Payer: Self-pay | Admitting: Family

## 2023-10-29 ENCOUNTER — Other Ambulatory Visit: Payer: Self-pay | Admitting: Internal Medicine

## 2023-10-29 DIAGNOSIS — M792 Neuralgia and neuritis, unspecified: Secondary | ICD-10-CM

## 2023-10-29 MED ORDER — PREGABALIN 75 MG PO CAPS: 150.0000 mg | ORAL_CAPSULE | Freq: Every day | ORAL | 1 refills | Status: DC

## 2023-10-29 NOTE — Telephone Encounter (Signed)
 Attempted to contact patient to schedule follow up appointment.  Patient unable to be reached.  LVM to call back.  SS, CCMA

## 2023-10-29 NOTE — Addendum Note (Signed)
 Addended by: MARIANNA ELLOUISE SQUIBB on: 10/29/2023 04:35 PM   Modules accepted: Orders

## 2023-11-04 ENCOUNTER — Other Ambulatory Visit (INDEPENDENT_AMBULATORY_CARE_PROVIDER_SITE_OTHER): Payer: Self-pay | Admitting: Pediatrics

## 2023-11-05 ENCOUNTER — Encounter: Payer: Self-pay | Admitting: Urgent Care

## 2023-11-05 ENCOUNTER — Inpatient Hospital Stay
Admission: RE | Admit: 2023-11-05 | Discharge: 2023-11-05 | Disposition: A | Discharge status: Home / Self Care | Source: Ambulatory Visit

## 2023-11-05 HISTORY — DX: Syringomyelia and syringobulbia: G95.0

## 2023-11-05 HISTORY — DX: Presence of other vascular implants and grafts: Z95.828

## 2023-11-05 HISTORY — DX: Ehlers-Danlos syndrome, unspecified: Q79.60

## 2023-11-05 HISTORY — DX: Hyperprolactinemia: E22.1

## 2023-11-05 HISTORY — DX: Gastrostomy status: Z93.1

## 2023-11-05 HISTORY — DX: Myasthenia gravis without (acute) exacerbation: G70.00

## 2023-11-05 HISTORY — DX: Sensorineural hearing loss, bilateral: H90.3

## 2023-11-05 HISTORY — DX: Other specified congenital deformities of feet: Q66.89

## 2023-11-05 HISTORY — DX: Raynaud's syndrome without gangrene: I73.00

## 2023-11-05 HISTORY — DX: Iron deficiency anemia, unspecified: D50.9

## 2023-11-05 HISTORY — DX: Personal history of other diseases of the nervous system and sense organs: Z86.69

## 2023-11-05 NOTE — Progress Notes (Signed)
 Perioperative Services Pre-Admission/Anesthesia Testing    Date: 11/05/23  Name: Laura Parrish DOB: 2000/03/23 MRN:   [id number]  Re: Plans for surgery; need for higher level care  Planned Surgical Procedure(s):     Case: [id number] Date/Time: 11/12/23 0715   Procedures:      LAPAROSCOPY, DIAGNOSTIC, ROBOT-ASSISTED (Pelvis)     EXCISION, ENDOMETRIOSIS, ROBOTIC ASSISTED, LAPAROSCOPIC (Pelvis)     CYSTOSCOPY, WITH BLADDER HYDRODISTENSION     LYSIS, ADHESIONS, ROBOT-ASSISTED, LAPAROSCOPIC (Pelvis)     APPENDECTOMY, ROBOT-ASSISTED, LAPAROSCOPIC (Abdomen)   Anesthesia type: Choice   Diagnosis:      Endometriosis [N80.9]     Chronic pelvic pain in female [R10.20, G89.29]   Pre-op diagnosis:      Endometriosis     chronic right pelvic pain   Location: [facility] OR ROOM 07 / [facility]   Surgeons: Verdon Keen, MD; Tye Millet, DO        Clinical Notes:  Patient is scheduled for the above procedure on 11/12/2023.  Planned intervention is a combination procedure with Dr. Keen Verdon, MD and Dr. Millet Tye, DO.  Patient was referred to preoperative APP for further evaluation and clearance prior to upcoming procedure.    Patient's past medical history was extensively reviewed.  Patient has a complex past medical history that is significant for: mitochondrial disease/cytopathy, autonomic dysfunction, myasthenia gravis, Chiari I malformation, Ehlers-Danlos syndrome, hypothyroidism, and anemia.  Patient has both vascular access (Port-A-Cath) and gastrostomy tube in place.  Patient has a history of previous anesthetic complications including PONV and delayed/prolonged emergence.  She has never undergone an anesthetic course here at [facility].   Impression and Plan:  Laura Parrish has been submitted for review by anesthesia prior to upcoming, nation procedure with general surgery and OB/GYN. records extensively reviewed.    Given  the aforementioned complexities noted in the patient's past medical history, and to ensure a thorough and objective review, patient was discussed with attending anesthesiologist on-call Bernette, MD).  Due to patient's past medical history, I discussed my concerns regarding patient's sensitivity to volatiles (sevoflurane/isoflurane) used for general anesthesia.  Additionally, I have concerns with patient receiving propofol due to her mitochondrial disorder, as this medication places patient at increased risk for developing propofol infusion syndrome (PRIS). It is important to note that her medical history indicates a previous, yet unknown, allergy  to propofol in the past. MD agrees with my assessment and concerns related to patient being able to undergo a safe and effective anesthesia course here at our facility.   With all of that said, the anesthesia service has determined that her health history precludes this patient from safely receiving anesthesia services here at [facility]. There are safety concerns that are directly related to her anesthesia care that must be taken into consideration. Patient will need to receive care in a facility that is equipped with specialty care personnel in place who are able to intervene should emergent circumstances arise. Anesthesia team is requesting that patient's care be transferred to a tertiary care center for completion of this case.  Copy of note being sent to both Dr. Tye, DO and Dr. Verdon, MD to make them aware of the request from anesthesia to have the care of this patient transferred. If either surgeon has concerns, or does not agree with the decision, they are asked to reach out directly to the anesthesia attendings to discuss further.  No further needs from the  PAT department identified at this time.  Dorise Pereyra, MSN, APRN, FNP-C, CEN [facility]  Perioperative Services Nurse Practitioner Phone: [phone] Fax: [phone] 11/05/23 12:03 PM  NOTE: This note has been prepared using Dragon dictation software. Despite my best ability to proofread, there is always the potential that unintentional transcriptional errors may still occur from this process.

## 2023-11-05 NOTE — Patient Instructions (Addendum)
 Your procedure is scheduled on:11-12-23 Thursday Report to the Registration Desk on the 1st floor of the Medical Mall.Then proceed to the 2nd floor Surgery Desk To find out your arrival time, please call [phone] between 1PM - 3PM on:11-11-23 Wednesday If your arrival time is 6:00 am, do not arrive before that time as the Medical Mall entrance doors do not open until 6:00 am.  REMEMBER: Instructions that are not followed completely may result in serious medical risk, up to and including death; or upon the discretion of your surgeon and anesthesiologist your surgery may need to be rescheduled.  Do not eat food after midnight the night before surgery.  No gum chewing or hard candies.  You may however, drink CLEAR liquids up to 2 hours before you are scheduled to arrive for your surgery. Do not drink anything within 2 hours of your scheduled arrival time.  Clear liquids include: - water  - apple juice without pulp - gatorade (not RED colors) - black coffee or tea (Do NOT add milk or creamers to the coffee or tea) Do NOT drink anything that is not on this list.  In addition, your doctor has ordered for you to drink the provided:  Ensure Pre-Surgery Clear Carbohydrate Drink  Gatorade G2 Drinking this carbohydrate drink up to two hours before surgery helps to reduce insulin  resistance and improve patient outcomes. Please complete drinking 2 hours before scheduled arrival time.  One week prior to surgery:Stop NOW (11-05-23) Stop Anti-inflammatories (NSAIDS) such as Advil , Aleve, Ibuprofen , Motrin , Naproxen, Naprosyn and Aspirin based products such as Excedrin, Goody's Powder, BC Powder. Stop ANY OVER THE COUNTER supplements until after surgery.  You may however, continue to take Tylenol  if needed for pain up until the day of surgery  Continue taking all of your other prescription medications up until the day of surgery.  ON THE DAY OF SURGERY ONLY TAKE THESE MEDICATIONS WITH SIPS OF  WATER: -  No Alcohol for 24 hours before or after surgery.  No Smoking including e-cigarettes for 24 hours before surgery.  No chewable tobacco products for at least 6 hours before surgery.  No nicotine patches on the day of surgery.  Do not use any recreational drugs for at least a week (preferably 2 weeks) before your surgery.  Please be advised that the combination of cocaine and anesthesia may have negative outcomes, up to and including death. If you test positive for cocaine, your surgery will be cancelled.  On the morning of surgery brush your teeth with toothpaste and water, you may rinse your mouth with mouthwash if you wish. Do not swallow any toothpaste or mouthwash.  Use CHG Soap or wipes as directed on instruction sheet.  Do not wear jewelry, make-up, hairpins, clips or nail polish.  For welded (permanent) jewelry: bracelets, anklets, waist bands, etc.  Please have this removed prior to surgery.  If it is not removed, there is a chance that hospital personnel will need to cut it off on the day of surgery.  Do not wear lotions, powders, or perfumes.   Do not shave body hair from the neck down 48 hours before surgery.  Contact lenses, hearing aids and dentures may not be worn into surgery.  Do not bring valuables to the hospital. [facility] is not responsible for any missing/lost belongings or valuables.   Total Shoulder Arthroplasty:  use Benzoyl Peroxide 5% Gel as directed on instruction sheet.  Bring your C-PAP to the hospital in case you may have  to spend the night.   Notify your doctor if there is any change in your medical condition (cold, fever, infection).  Wear comfortable clothing (specific to your surgery type) to the hospital.  After surgery, you can help prevent lung complications by doing breathing exercises.  Take deep breaths and cough every 1-2 hours. Your doctor may order a device called an Incentive Spirometer to help you take deep breaths. When  coughing or sneezing, hold a pillow firmly against your incision with both hands. This is called "splinting." Doing this helps protect your incision. It also decreases belly discomfort.  If you are being admitted to the hospital overnight, leave your suitcase in the car. After surgery it may be brought to your room.  In case of increased patient census, it may be necessary for you, the patient, to continue your postoperative care in the Same Day Surgery department.  If you are being discharged the day of surgery, you will not be allowed to drive home. You will need a responsible individual to drive you home and stay with you for 24 hours after surgery.   If you are taking public transportation, you will need to have a responsible individual with you.  Please call the Pre-admissions Testing Dept. at [phone] if you have any questions about these instructions.  Surgery Visitation Policy:  Patients having surgery or a procedure may have two visitors.  Children under the age of 35 must have an adult with them who is not the patient.  Inpatient Visitation:    Visiting hours are 7 a.m. to 8 p.m. Up to four visitors are allowed at one time in a patient room. The visitors may rotate out with other people during the day.  One visitor age 67 or older may stay with the patient overnight and must be in the room by 8 p.m.   Merchandiser, retail to address health-related social needs:  [email or web address]

## 2023-11-10 ENCOUNTER — Inpatient Hospital Stay: Admission: RE | Admit: 2023-11-10 | Source: Ambulatory Visit

## 2023-11-12 ENCOUNTER — Ambulatory Visit: Admission: RE | Admit: 2023-11-12 | Source: Home / Self Care | Admitting: Obstetrics and Gynecology

## 2023-11-12 ENCOUNTER — Encounter: Admission: RE | Payer: Self-pay | Source: Home / Self Care

## 2023-11-12 ENCOUNTER — Other Ambulatory Visit (INDEPENDENT_AMBULATORY_CARE_PROVIDER_SITE_OTHER): Payer: Self-pay | Admitting: Family

## 2023-11-12 DIAGNOSIS — G43009 Migraine without aura, not intractable, without status migrainosus: Secondary | ICD-10-CM

## 2023-11-12 SURGERY — LAPAROSCOPY, DIAGNOSTIC, ROBOT-ASSISTED
Anesthesia: Choice | Site: Pelvis

## 2023-11-23 NOTE — Progress Notes (Signed)
 Laura Parrish   MRN:  [id number]  [birth year]   Provider: Ellouise Bollman NP-C Location of Care: [facility]  Visit type: Return visit  Last visit: 04/14/2023  Referral source: [facility] PCP: [facility] 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   Since last visit: Mansi reports that her migraine frequency has significantly improved since being on Emgality . She recalls just one severe migraine in the last few months. She has ongoing recurrent low back pain and asked for a refill of Lidocaine  patches. She finds that they are very helpful when back pain is problematic.  Canyon is frustrated because she was recently scheduled for exploratory surgery for suspected endometriosis that was cancelled at the last minute because of anesthesia concerns about her condition. She has an upcoming appointment with Dr Stevie with [facility] and plans to talk with him about making a plan to have the procedure done. 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  She continues to receive regular IV infusions via Portacath for symptom management for mitochondrial disease.  She has no equipment needs today. Crestina has been otherwise generally healthy since she was last seen. No health concerns today other than previously mentioned.  Review of systems: Please see HPI for neurologic and other pertinent review of systems. Otherwise all other systems were reviewed and were  negative.  Problem List: Patient Active Problem List   Diagnosis Date Noted   Upper back pain 03/27/2023   Neuropathic pain 10/12/2022   Family history of deafness and hearing loss 10/12/2022   Allergic rhinitis 09/19/2022   Anaphylaxis due to hymenoptera venom 06/16/2022   Allergic conjunctivitis of both eyes 06/16/2022   Latex allergy , contact dermatitis 06/16/2022   Viral URI 02/14/2022   Abdominal pain 07/19/2021   Insomnia 05/31/2020   Dysautonomia (HCC) 12/02/2019   Myasthenia gravis (HCC) 12/02/2019   Healthcare maintenance 12/02/2019   Raynaud's phenomenon 12/01/2019   Menorrhagia 12/01/2019   Referred ear pain, bilateral 11/01/2019   Feeding by G-tube (HCC) 03/07/2019   Thyroiditis, autoimmune 02/06/2018   Goiter 02/06/2018   Weakness 02/06/2018   Hyperprolactinemia 02/06/2018   Hyperhidrosis of palms and soles 02/06/2018   Dehydration 02/06/2018   At risk for dehydration 10/12/2017   Breakthrough bleeding on birth control pills 06/25/2017   Alteration in metabolic regulation 06/25/2017   Ehlers-Danlos syndrome 12/29/2016   Snoring 09/28/2016   Port-A-Cath in place 09/22/2016   Iron  deficiency anemia 07/23/2016   Health care home, active care coordination 07/23/2016   Complex care coordination 07/23/2016   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   Chronic bilateral low back pain without sciatica 05/22/2016   Clubfoot, 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   Paresthesia 05/22/2016   Premature birth 05/22/2016   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   Allergy  to insect bites and stings 04/10/2016   Atopic dermatitis 04/10/2016   Contact dermatitis due to chemicals 04/10/2016   Chronic rhinitis 04/10/2016   Allergic urticaria 04/10/2016   Chronic fatigue 02/15/2015   Lactose intolerance 01/27/2013   Migraine without aura and without status migrainosus, not intractable  10/01/2012   Mitochondrial disease 03/15/2012   Irregular menstrual cycle  09/29/2011   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   Talipes 05/29/2000     Past Medical History:  Diagnosis Date   Allergy  to alpha-gal    elevated IgE 04/10/16   Autonomic dysfunction    Chiari I malformation (HCC)    Cholelithiasis    Complication of anesthesia    delayed emergence   Congenital clubfoot    Dyspnea    Eczema    Ehlers-Danlos syndrome    Gallstones    Gastrostomy tube in place (HCC)    GERD (gastroesophageal reflux disease)    Heart murmur    History of migraine    Hyperprolactinemia    Hypoglycemia    Hypothyroid    IBS (irritable bowel syndrome)    IDA (iron  deficiency anemia)    Joint pain    Mitochondrial disease    Muscle pain    Myasthenia gravis (HCC)    Pneumonia    several times   PONV (postoperative nausea and vomiting)    Port-A-Cath in place    Raynaud disease    Raynaud's disease    Sensory hearing loss, bilateral    Syringomyelia and syringobulbia (HCC)     Past medical history comments: See HPI  Surgical history: Past Surgical History:  Procedure Laterality Date   ADENOIDECTOMY     2004or 2005   chiari decompression  2007   GALLBLADDER SURGERY  2015   GASTROSTOMY TUBE PLACEMENT     x 2   IR CV LINE INJECTION  07/06/2020   MUSCLE BIOPSY  2010   Port Replacement  05/2018   x 2 orignal 08/2016 then replacement 05/2018   PORT-A-CATH REMOVAL N/A 03/24/2019   Procedure: PORT-A-CATH REMOVAL;  Surgeon: Stevie Herlene Righter, MD;  Location: [facility] OR;  Service: General;  Laterality: N/A;   PORTACATH PLACEMENT Right 03/24/2019   Procedure: PORT-A-CATH INSERTION under ultrasound and fluoroscopic guidance;  Surgeon: Stevie Herlene Righter, MD;  Location: [facility] OR;  Service: General;  Laterality: Right;   REMOVAL OF GASTROSTOMY TUBE N/A 03/24/2019   Procedure: Exchange of Gastrostomy Tube;  Surgeon: Stevie Herlene Righter, MD;  Location: [facility] OR;  Service: General;  Laterality: N/A;   tethered cord  07/2006   TONSILLECTOMY      [id number]    Family history: family history includes Allergic rhinitis in her brother, maternal grandmother, mother, and sister; Asthma in her brother, maternal grandmother, and mother; Diabetes type II in her maternal grandmother; Heart murmur in her mother; Lung cancer in her maternal grandfather; Mitochondrial disorder in her brother; Seizures in her brother; Thyroid  disease in her sister.   Social history: Social History   Socioeconomic History   Marital status: Single    Spouse name: Not on file   Number of children: Not on file   Years of education: Not on file   Highest education level: Not on file  Occupational History   Occupation: environmental manager  Tobacco Use   Smoking status: Never    Passive exposure: Never   Smokeless tobacco: Never  Vaping Use   Vaping status: Never Used  Substance and Sexual Activity   Alcohol use: No   Drug use: No   Sexual activity: Never    Birth control/protection: None  Other Topics Concern   Not on file  Social History Narrative   Ariah graduated from [employer].    Graduated from [employer]. Currently teaching online.    She enjoys  reading, playing with her cat and her dog, and hanging out with her sister.    Social Drivers of Corporate Investment Banker Strain: Low Risk  (09/29/2023)   Received from [facility]   Overall Financial Resource Strain (CARDIA)    Difficulty of Paying Living Expenses: Not very hard  Food Insecurity: No Food Insecurity (09/29/2023)   Received from [facility]   Hunger Vital Sign    Within the past 12 months, you worried that your food would run out before you got the money to buy more.: Never true    Within the past 12 months, the food you bought just didn't last and you didn't have money to get more.: Never true  Transportation Needs: No Transportation Needs (09/29/2023)   Received from [facility] - Transportation     In the past 12 months, has lack of transportation kept you from medical appointments or from getting medications?: No    Lack of Transportation (Non-Medical): No  Physical Activity: Not on file  Stress: Not on file  Social Connections: Moderately Integrated (02/12/2022)   Social Connection and Isolation Panel    Frequency of Communication with Friends and Family: More than three times a week    Frequency of Social Gatherings with Friends and Family: More than three times a week    Attends Religious Services: More than 4 times per year    Active Member of [employer] or Organizations: Yes    Attends Banker Meetings: More than 4 times per year    Marital Status: Never married  Intimate Partner Violence: Not At Risk (02/12/2022)   Humiliation, Afraid, Rape, and Kick questionnaire    Fear of Current or Ex-Partner: No    Emotionally Abused: No    Physically Abused: No    Sexually Abused: No    Past/failed meds:  Allergies: Allergies  Allergen Reactions   Other Anaphylaxis    Alpha Gal allergy  \ Red meat\ any hoof animal   Latex Rash   Lactated Ringers     Contraindicated w/ mitochondrial syndrome   Propofol Other (See Comments)    Due to possible mitochondrial disorder   Tape Itching and Other (See Comments)    Immunizations: Immunization History  Administered Date(s) Administered   DTaP 07/25/1999, 10/02/1999, 11/27/1999, 09/08/2000, 08/21/2004   H1N1 11/10/2007   HIB (PRP-OMP) 07/25/1999, 10/02/1999, 11/27/1999, 09/08/2000   HPV Quadrivalent 05/17/2012   Hepatitis A, Adult 08/21/2004, 11/10/2007   Hepatitis B, ADULT April [birth year], 1999, 06/27/1999, 11/27/1999   IPV 07/25/1999, 10/02/1999, 09/08/2000, 08/21/2004   Influenza, Seasonal, Injecte, Preservative Fre 11/10/2007, 10/18/2008, 10/01/2009, 11/01/2010, 10/31/2011, 09/19/2022   Influenza,inj,Quad PF,6+ Mos 10/13/2013, 10/12/2014, 10/03/2015, 10/15/2018, 11/17/2019   Influenza,trivalent, recombinat, inj, PF 10/08/2012    Influenza-Unspecified 10/12/2014   MMR 06/29/2000, 08/21/2004   Meningococcal Conjugate 06/05/2009   PFIZER Comirnaty(Gray Top)Covid-19 Tri-Sucrose Vaccine 01/17/2019, 05/02/2019, 05/23/2019   Pneumococcal-Unspecified 07/25/1999, 10/02/1999, 11/27/1999, 06/29/2000   Tdap 06/05/2009   Varicella 06/29/2000, 05/07/2009   Diagnostics/Screenings:  Physical Exam: BP 100/70 (BP Location: Left Arm, Patient Position: Sitting, Cuff Size: Normal)   Pulse 60   Ht 5' 4.57 (1.64 m)   Wt 129 lb (58.5 kg)   BMI 21.76 kg/m   General: Well developed, well nourished young woman, seated on exam table, in no evident distress Head: Head normocephalic and atraumatic.  Oropharynx benign. Neck: Supple Cardiovascular: Regular rate and rhythm, no murmurs Respiratory: Breath sounds clear to auscultation Musculoskeletal: No obvious deformities or  scoliosis Skin: No rashes or neurocutaneous lesions  Neurologic Exam Mental Status: Awake and fully alert.  Oriented to place and time.  Recent and remote memory intact.  Attention span, concentration, and fund of knowledge appropriate.  Mood and affect appropriate. Cranial Nerves: Fundoscopic exam reveals sharp disc margins.  Pupils equal, briskly reactive to light.  Extraocular movements full without nystagmus. Hearing intact and symmetric to whisper.  Facial sensation intact.  Face tongue, palate move normally and symmetrically. Shoulder shrug normal Motor: Normal bulk and tone. Mild weakness in all tested extremity muscles. Sensory: Intact to touch and temperature in all extremities.  Coordination: Rapid alternating movements normal in all extremities.  Finger-to-nose and heel-to shin performed accurately bilaterally.  Romberg negative. Gait and Station: Arises from chair without difficulty.  Stance is normal. Gait demonstrates normal stride length and balance.   Able to heel, toe and tandem walk without difficulty. Reflexes: 1+ and symmetric. Toes downgoing.    Impression: Migraine without aura and without status migrainosus, not intractable - Plan: EMGALITY  120 MG/ML SOAJ  Feeding by G-tube (HCC)  Mitochondrial disease  Ehlers-Danlos syndrome  Chronic bilateral low back pain without sciatica - Plan: lidocaine  (LIDODERM ) 5 %  Chiari I malformation (HCC)  Port-A-Cath in place   Recommendations for plan of care: The patient's previous Epic records were reviewed. No recent diagnostic studies to be reviewed with the patient.   Recommendations and plan until next visit: Continue medications as prescribed  Refills issued for Emgality  and Lidocaine  patches Call if headaches become more frequent or more severe or for questions or concerns Return in about 6 months (around 05/23/2024).  The medication list was reviewed and reconciled. No changes were made in the prescribed medications today. A complete medication list was provided to the patient.  Allergies as of 11/24/2023       Reactions   Other Anaphylaxis   Alpha Gal allergy  \ Red meat\ any hoof animal   Latex Rash   Lactated Ringers    Contraindicated w/ mitochondrial syndrome   Propofol Other (See Comments)   Due to possible mitochondrial disorder   Tape Itching, Other (See Comments)        Medication List        Accurate as of November 24, 2023  6:39 PM. If you have any questions, ask your nurse or doctor.          albuterol  108 (90 Base) MCG/ACT inhaler Commonly known as: Proventil  HFA Inhale 1-2 puffs into the lungs every 4 (four) hours as needed for wheezing or shortness of breath.   albuterol  0.63 MG/3ML nebulizer solution Commonly known as: ACCUNEB  Take 3ml by nebulizer 3 times per day as needed for shortness of breath   ascorbic acid 500 MG tablet Commonly known as: VITAMIN C  Take 500 mg by mouth daily.   azelastine  0.1 % nasal spray Commonly known as: ASTELIN  Place 2 sprays into both nostrils 2 (two) times daily. Use in each nostril as directed    Baqsimi  Two Pack 3 MG/DOSE Powd Generic drug: Glucagon  Place 1 spray into the nose as directed.   budesonide  0.5 MG/2ML nebulizer solution Commonly known as: Pulmicort  Start at onset of respiratory illness-use 1 vial twice daily inhaled via nebulizer for 2 weeks or until symptoms resolve.   clobetasol  0.05 % topical foam Commonly known as: OLUX  Apply 1 application  topically 2 (two) times daily as needed (eczema).   Coenzyme Q-10 200 MG Caps Take 200 mg by mouth in the morning.  diphenhydrAMINE 25 MG tablet Commonly known as: BENADRYL Take 25 mg by mouth every 6 (six) hours as needed (reaction).   Emgality  120 MG/ML Soaj Generic drug: Galcanezumab -gnlm INJECT 120 MG SUBCUTANEOUSLY EVERY 28 DAYS (MAINTENANCE DOSE)   EPINEPHrine  0.3 mg/0.3 mL Soaj injection Commonly known as: EPI-PEN Inject 0.3 mg into the muscle as needed for anaphylaxis. As needed   hydrocortisone  2.5 % ointment Apply topically twice daily as need to red sandpapery rash.   ibuprofen  200 MG tablet Commonly known as: ADVIL  Take 400 mg by mouth every 8 (eight) hours as needed (oaub,).   leucovorin 25 MG tablet Commonly known as: WELLCOVORIN TAKE 1 TABLET BY MOUTH 2 TIMES A DAY   levOCARNitine  1 GM/10ML solution Commonly known as: CARNITOR  TAKE 2 TEASPOONFUL ( ) BY MOUTH TWICEDAILY   levocetirizine 5 MG tablet Commonly known as: XYZAL  Take 5 mg by mouth at bedtime.   lidocaine  5 % Commonly known as: Lidoderm  Place 1 patch onto the skin daily. Remove & Discard patch within 12 hours or as directed by MD Started by: Ellouise Bollman   lidocaine -prilocaine cream Commonly known as: EMLA Apply 1 Application topically as needed (port access).   Nutritional Supplement Plus Liqd 237-474 mL (1-2 cartons) Boost Breeze (peach flavored only) given po daily.   pregabalin  75 MG capsule Commonly known as: LYRICA  Take 2 capsules (150 mg total) by mouth at bedtime. What changed: Another medication with  the same name was removed. Continue taking this medication, and follow the directions you see here. Changed by: Ellouise Bollman   promethazine  25 MG tablet Commonly known as: PHENERGAN  Take 1 tablet at onset of migraine. May repeat every 6 hours PRN   pyridostigmine  60 MG tablet Commonly known as: MESTINON  TAKE 1 TABLET BY MOUTH EACH MORNING, AT NOON, IN THE EVENING, AND AT BEDTIME   RABEprazole  20 MG tablet Commonly known as: ACIPHEX  TAKE 1 TABLET BY MOUTH 2 TIMES A DAY   Slynd 4 MG Tabs Generic drug: Drospirenone Take 1 tablet by mouth every evening.   Synthroid  88 MCG tablet Generic drug: levothyroxine  TAKE 1 TABLET BY MOUTH EVERY MORNING ON AN EMPTY STOMACH   topiramate  25 MG tablet Commonly known as: TOPAMAX  Take 1 tablet (25 mg total) by mouth daily after breakfast.   traZODone  50 MG tablet Commonly known as: DESYREL  TAKE 1/2 TABLET BY MOUTH 30 MINUTES PRIOR TO BEDTIME. IF NEEDED, MAY INCREASE TO 1 TABLET BEFORE BEDTIME      Total time spent with the patient was 25 minutes, of which 50% or more was spent in counseling and coordination of care.  Ellouise Bollman NP-C  [facility] [address], [address] [place], [place] [address] Ph. [phone] Fax [phone]

## 2023-11-24 ENCOUNTER — Ambulatory Visit (INDEPENDENT_AMBULATORY_CARE_PROVIDER_SITE_OTHER): Payer: Self-pay | Admitting: Family

## 2023-11-24 ENCOUNTER — Encounter (INDEPENDENT_AMBULATORY_CARE_PROVIDER_SITE_OTHER): Payer: Self-pay | Admitting: Family

## 2023-11-24 VITALS — BP 100/70 | HR 60 | Ht 64.57 in | Wt 129.0 lb

## 2023-11-24 DIAGNOSIS — G8929 Other chronic pain: Secondary | ICD-10-CM

## 2023-11-24 DIAGNOSIS — E8840 Mitochondrial metabolism disorder, unspecified: Secondary | ICD-10-CM | POA: Diagnosis not present

## 2023-11-24 DIAGNOSIS — Z931 Gastrostomy status: Secondary | ICD-10-CM

## 2023-11-24 DIAGNOSIS — G43009 Migraine without aura, not intractable, without status migrainosus: Secondary | ICD-10-CM | POA: Diagnosis not present

## 2023-11-24 DIAGNOSIS — Q796 Ehlers-Danlos syndrome, unspecified: Secondary | ICD-10-CM | POA: Diagnosis not present

## 2023-11-24 DIAGNOSIS — G935 Compression of brain: Secondary | ICD-10-CM

## 2023-11-24 DIAGNOSIS — Z95828 Presence of other vascular implants and grafts: Secondary | ICD-10-CM

## 2023-11-24 DIAGNOSIS — M545 Low back pain, unspecified: Secondary | ICD-10-CM | POA: Diagnosis not present

## 2023-11-24 MED ORDER — EMGALITY 120 MG/ML ~~LOC~~ SOAJ: SUBCUTANEOUS | 5 refills | Status: AC

## 2023-11-24 MED ORDER — LIDOCAINE 5 % EX PTCH: 1.0000 | MEDICATED_PATCH | CUTANEOUS | 1 refills | Status: AC

## 2023-11-24 NOTE — Patient Instructions (Addendum)
 It was a pleasure to see you today!  Instructions for you until your next appointment are as follows: Continue medications as prescribed Continue regular infusions  Call if headaches become more frequent or more severe Please sign up for MyChart if you have not done so. Please plan to return for follow up in 6 months or sooner if needed.  Feel free to contact our office during normal business hours at [phone] with questions or concerns. If there is no answer or the call is outside business hours, please leave a message and our clinic staff will call you back within the next business day.  If you have an urgent concern, please stay on the line for our after-hours answering service and ask for the on-call neurologist.     I also encourage you to use MyChart to communicate with me more directly. If you have not yet signed up for MyChart within [facility], the front desk staff can help you. However, please note that this inbox is NOT monitored on nights or weekends, and response can take up to 2 business days.  Urgent matters should be discussed with the on-call pediatric neurologist.   At [facility], we are committed to providing exceptional care. You will receive a patient satisfaction survey through text or email regarding your visit today. Your opinion is important to me. Comments are appreciated.

## 2023-12-24 ENCOUNTER — Other Ambulatory Visit (INDEPENDENT_AMBULATORY_CARE_PROVIDER_SITE_OTHER): Payer: Self-pay | Admitting: Family

## 2024-01-04 ENCOUNTER — Other Ambulatory Visit (INDEPENDENT_AMBULATORY_CARE_PROVIDER_SITE_OTHER): Payer: Self-pay | Admitting: Family

## 2024-01-04 DIAGNOSIS — M792 Neuralgia and neuritis, unspecified: Secondary | ICD-10-CM

## 2024-02-16 ENCOUNTER — Encounter (INDEPENDENT_AMBULATORY_CARE_PROVIDER_SITE_OTHER): Payer: Self-pay | Admitting: Family

## 2024-02-16 ENCOUNTER — Ambulatory Visit (INDEPENDENT_AMBULATORY_CARE_PROVIDER_SITE_OTHER): Admitting: Family

## 2024-02-16 VITALS — BP 90/62 | HR 68 | Ht 63.27 in | Wt 130.0 lb

## 2024-02-16 DIAGNOSIS — N809 Endometriosis, unspecified: Secondary | ICD-10-CM | POA: Insufficient documentation

## 2024-02-16 DIAGNOSIS — Q796 Ehlers-Danlos syndrome, unspecified: Secondary | ICD-10-CM

## 2024-02-16 DIAGNOSIS — G43009 Migraine without aura, not intractable, without status migrainosus: Secondary | ICD-10-CM

## 2024-02-16 DIAGNOSIS — G935 Compression of brain: Secondary | ICD-10-CM

## 2024-02-16 DIAGNOSIS — E8840 Mitochondrial metabolism disorder, unspecified: Secondary | ICD-10-CM

## 2024-02-16 DIAGNOSIS — Z95828 Presence of other vascular implants and grafts: Secondary | ICD-10-CM

## 2024-02-16 DIAGNOSIS — Z9189 Other specified personal risk factors, not elsewhere classified: Secondary | ICD-10-CM

## 2024-02-16 DIAGNOSIS — M792 Neuralgia and neuritis, unspecified: Secondary | ICD-10-CM

## 2024-02-16 DIAGNOSIS — G901 Familial dysautonomia [Riley-Day]: Secondary | ICD-10-CM

## 2024-02-16 NOTE — Patient Instructions (Addendum)
 It was a pleasure to see you today!  Instructions for you until your next appointment are as follows: Continue taking your medications as prescribed I will place a referral for a gynecologist Please sign up for MyChart if you have not done so. Please plan to return for follow up in May as scheduled or sooner if needed.  Feel free to contact our office during normal business hours at [phone] with questions or concerns. If there is no answer or the call is outside business hours, please leave a message and our clinic staff will call you back within the next business day.  If you have an urgent concern, please stay on the line for our after-hours answering service and ask for the on-call neurologist.     I also encourage you to use MyChart to communicate with me more directly. If you have not yet signed up for MyChart within [facility], the front desk staff can help you. However, please note that this inbox is NOT monitored on nights or weekends, and response can take up to 2 business days.  Urgent matters should be discussed with the on-call pediatric neurologist.   At [facility], we are committed to providing exceptional care. You will receive a patient satisfaction survey through text or email regarding your visit today. Your opinion is important to me. Comments are appreciated.

## 2024-02-19 ENCOUNTER — Encounter (INDEPENDENT_AMBULATORY_CARE_PROVIDER_SITE_OTHER): Payer: Self-pay | Admitting: Family

## 2024-03-03 ENCOUNTER — Ambulatory Visit: Admitting: Obstetrics and Gynecology

## 2024-05-25 ENCOUNTER — Ambulatory Visit (INDEPENDENT_AMBULATORY_CARE_PROVIDER_SITE_OTHER): Admitting: Family
